# Patient Record
Sex: Female | Born: 1960 | Race: Asian | Hispanic: No | Marital: Single | Smoking: Never smoker
Health system: Southern US, Community
[De-identification: ages and names within clinical notes are randomized; demographics above are authoritative.]

## PROBLEM LIST (undated history)

## (undated) DIAGNOSIS — E785 Hyperlipidemia, unspecified: Secondary | ICD-10-CM

## (undated) DIAGNOSIS — E039 Hypothyroidism, unspecified: Secondary | ICD-10-CM

## (undated) DIAGNOSIS — R519 Headache, unspecified: Secondary | ICD-10-CM

## (undated) DIAGNOSIS — M549 Dorsalgia, unspecified: Secondary | ICD-10-CM

## (undated) DIAGNOSIS — M1990 Unspecified osteoarthritis, unspecified site: Secondary | ICD-10-CM

## (undated) DIAGNOSIS — M79652 Pain in left thigh: Secondary | ICD-10-CM

## (undated) DIAGNOSIS — G4700 Insomnia, unspecified: Secondary | ICD-10-CM

## (undated) DIAGNOSIS — I10 Essential (primary) hypertension: Secondary | ICD-10-CM

## (undated) DIAGNOSIS — M25552 Pain in left hip: Secondary | ICD-10-CM

## (undated) DIAGNOSIS — R51 Headache: Secondary | ICD-10-CM

## (undated) HISTORY — DX: Headache, unspecified: R51.9

## (undated) HISTORY — DX: Unspecified osteoarthritis, unspecified site: M19.90

## (undated) HISTORY — PX: UPPER GASTROINTESTINAL ENDOSCOPY: SHX188

## (undated) HISTORY — DX: Dorsalgia, unspecified: M54.9

## (undated) HISTORY — DX: Insomnia, unspecified: G47.00

## (undated) HISTORY — DX: Pain in left hip: M25.552

## (undated) HISTORY — DX: Headache: R51

## (undated) HISTORY — PX: TUBAL LIGATION: SHX77

## (undated) HISTORY — DX: Pain in left thigh: M79.652

## (undated) HISTORY — DX: Hyperlipidemia, unspecified: E78.5

## (undated) HISTORY — DX: Hypothyroidism, unspecified: E03.9

---

## 1998-07-11 ENCOUNTER — Emergency Department (HOSPITAL_COMMUNITY): Admission: EM | Admit: 1998-07-11 | Discharge: 1998-07-11 | Payer: Self-pay | Admitting: Emergency Medicine

## 1999-08-04 ENCOUNTER — Other Ambulatory Visit: Admission: RE | Admit: 1999-08-04 | Discharge: 1999-08-04 | Payer: Self-pay | Admitting: Obstetrics and Gynecology

## 2000-10-18 ENCOUNTER — Encounter: Admission: RE | Admit: 2000-10-18 | Discharge: 2000-10-18 | Payer: Self-pay | Admitting: Otolaryngology

## 2000-10-18 ENCOUNTER — Encounter: Payer: Self-pay | Admitting: Otolaryngology

## 2001-01-12 ENCOUNTER — Other Ambulatory Visit: Admission: RE | Admit: 2001-01-12 | Discharge: 2001-01-12 | Payer: Self-pay | Admitting: Obstetrics and Gynecology

## 2001-02-14 ENCOUNTER — Other Ambulatory Visit: Admission: RE | Admit: 2001-02-14 | Discharge: 2001-02-14 | Payer: Self-pay | Admitting: Obstetrics and Gynecology

## 2001-02-14 ENCOUNTER — Encounter (INDEPENDENT_AMBULATORY_CARE_PROVIDER_SITE_OTHER): Payer: Self-pay | Admitting: Specialist

## 2001-04-11 ENCOUNTER — Encounter: Admission: RE | Admit: 2001-04-11 | Discharge: 2001-04-17 | Payer: Self-pay | Admitting: Family Medicine

## 2001-05-31 ENCOUNTER — Other Ambulatory Visit: Admission: RE | Admit: 2001-05-31 | Discharge: 2001-05-31 | Payer: Self-pay | Admitting: Obstetrics and Gynecology

## 2001-08-22 ENCOUNTER — Other Ambulatory Visit: Admission: RE | Admit: 2001-08-22 | Discharge: 2001-08-22 | Payer: Self-pay | Admitting: Obstetrics and Gynecology

## 2001-11-22 ENCOUNTER — Other Ambulatory Visit: Admission: RE | Admit: 2001-11-22 | Discharge: 2001-11-22 | Payer: Self-pay | Admitting: Obstetrics and Gynecology

## 2002-05-03 ENCOUNTER — Encounter: Admission: RE | Admit: 2002-05-03 | Discharge: 2002-05-03 | Payer: Self-pay | Admitting: Family Medicine

## 2002-05-03 ENCOUNTER — Encounter: Payer: Self-pay | Admitting: Family Medicine

## 2002-05-30 ENCOUNTER — Other Ambulatory Visit: Admission: RE | Admit: 2002-05-30 | Discharge: 2002-05-30 | Payer: Self-pay | Admitting: Obstetrics and Gynecology

## 2002-12-25 ENCOUNTER — Other Ambulatory Visit: Admission: RE | Admit: 2002-12-25 | Discharge: 2002-12-25 | Payer: Self-pay | Admitting: Obstetrics and Gynecology

## 2003-06-27 ENCOUNTER — Other Ambulatory Visit: Admission: RE | Admit: 2003-06-27 | Discharge: 2003-06-27 | Payer: Self-pay | Admitting: Obstetrics and Gynecology

## 2003-07-26 ENCOUNTER — Encounter: Payer: Self-pay | Admitting: Internal Medicine

## 2003-07-26 ENCOUNTER — Encounter: Admission: RE | Admit: 2003-07-26 | Discharge: 2003-07-26 | Payer: Self-pay | Admitting: Internal Medicine

## 2003-08-13 ENCOUNTER — Encounter: Admission: RE | Admit: 2003-08-13 | Discharge: 2003-08-13 | Payer: Self-pay | Admitting: Internal Medicine

## 2003-08-13 ENCOUNTER — Encounter: Payer: Self-pay | Admitting: Internal Medicine

## 2003-12-23 ENCOUNTER — Encounter: Admission: RE | Admit: 2003-12-23 | Discharge: 2003-12-23 | Payer: Self-pay | Admitting: Internal Medicine

## 2004-07-30 ENCOUNTER — Other Ambulatory Visit: Admission: RE | Admit: 2004-07-30 | Discharge: 2004-07-30 | Payer: Self-pay | Admitting: Obstetrics and Gynecology

## 2005-10-20 ENCOUNTER — Other Ambulatory Visit: Admission: RE | Admit: 2005-10-20 | Discharge: 2005-10-20 | Payer: Self-pay | Admitting: Obstetrics and Gynecology

## 2009-06-13 ENCOUNTER — Encounter: Admission: RE | Admit: 2009-06-13 | Discharge: 2009-06-13 | Payer: Self-pay | Admitting: Specialist

## 2010-03-31 ENCOUNTER — Inpatient Hospital Stay (HOSPITAL_COMMUNITY): Admission: RE | Admit: 2010-03-31 | Discharge: 2010-04-02 | Payer: Self-pay | Admitting: Orthopedic Surgery

## 2010-03-31 HISTORY — PX: TOTAL HIP ARTHROPLASTY: SHX124

## 2010-08-11 ENCOUNTER — Encounter (INDEPENDENT_AMBULATORY_CARE_PROVIDER_SITE_OTHER): Payer: Self-pay | Admitting: Family Medicine

## 2010-08-11 ENCOUNTER — Ambulatory Visit: Payer: Self-pay | Admitting: Internal Medicine

## 2010-08-11 LAB — CONVERTED CEMR LAB
ALT: 51 units/L — ABNORMAL HIGH (ref 0–35)
Alkaline Phosphatase: 85 units/L (ref 39–117)
BUN: 16 mg/dL (ref 6–23)
Basophils Absolute: 0.1 10*3/uL (ref 0.0–0.1)
Calcium: 9.8 mg/dL (ref 8.4–10.5)
Creatinine, Ser: 0.56 mg/dL (ref 0.40–1.20)
Eosinophils Absolute: 1 10*3/uL — ABNORMAL HIGH (ref 0.0–0.7)
Eosinophils Relative: 16 % — ABNORMAL HIGH (ref 0–5)
HCT: 41.4 % (ref 36.0–46.0)
Hemoglobin: 13.1 g/dL (ref 12.0–15.0)
Lymphocytes Relative: 28 % (ref 12–46)
Lymphs Abs: 1.9 10*3/uL (ref 0.7–4.0)
MCV: 85.2 fL (ref 78.0–100.0)
Monocytes Absolute: 0.5 10*3/uL (ref 0.1–1.0)
Neutrophils Relative %: 48 % (ref 43–77)
Potassium: 4.4 meq/L (ref 3.5–5.3)
RDW: 15.4 % (ref 11.5–15.5)
Sed Rate: 25 mm/hr — ABNORMAL HIGH (ref 0–22)
Total Bilirubin: 0.4 mg/dL (ref 0.3–1.2)
Triglycerides: 96 mg/dL (ref <150)
UIBC: 210 ug/dL
WBC: 6.7 10*3/uL (ref 4.0–10.5)

## 2010-08-12 ENCOUNTER — Encounter (INDEPENDENT_AMBULATORY_CARE_PROVIDER_SITE_OTHER): Payer: Self-pay | Admitting: Family Medicine

## 2010-08-12 LAB — CONVERTED CEMR LAB: Hep B S Ab: NEGATIVE

## 2010-08-20 ENCOUNTER — Ambulatory Visit (HOSPITAL_COMMUNITY): Admission: RE | Admit: 2010-08-20 | Discharge: 2010-08-20 | Payer: Self-pay | Admitting: Family Medicine

## 2010-12-11 ENCOUNTER — Encounter (INDEPENDENT_AMBULATORY_CARE_PROVIDER_SITE_OTHER): Payer: Self-pay | Admitting: *Deleted

## 2010-12-11 LAB — CONVERTED CEMR LAB
Basophils Absolute: 0.1 10*3/uL (ref 0.0–0.1)
Basophils Relative: 1 % (ref 0–1)
CO2: 24 meq/L (ref 19–32)
Chloride: 103 meq/L (ref 96–112)
Eosinophils Relative: 20 % — ABNORMAL HIGH (ref 0–5)
Glucose, Bld: 84 mg/dL (ref 70–99)
HCT: 37.8 % (ref 36.0–46.0)
Hemoglobin: 12.1 g/dL (ref 12.0–15.0)
Lymphocytes Relative: 30 % (ref 12–46)
Lymphs Abs: 1.9 10*3/uL (ref 0.7–4.0)
MCHC: 32 g/dL (ref 30.0–36.0)
MCV: 86.5 fL (ref 78.0–100.0)
Monocytes Relative: 8 % (ref 3–12)
Platelets: 287 10*3/uL (ref 150–400)
Potassium: 4.3 meq/L (ref 3.5–5.3)
RDW: 13.5 % (ref 11.5–15.5)
Total Protein: 8.2 g/dL (ref 6.0–8.3)
WBC: 6.5 10*3/uL (ref 4.0–10.5)

## 2011-03-17 LAB — CBC
HCT: 26.8 % — ABNORMAL LOW (ref 36.0–46.0)
Hemoglobin: 8.9 g/dL — ABNORMAL LOW (ref 12.0–15.0)
MCHC: 32.7 g/dL (ref 30.0–36.0)
MCV: 86.7 fL (ref 78.0–100.0)
Platelets: 196 10*3/uL (ref 150–400)
Platelets: 217 10*3/uL (ref 150–400)
RBC: 3.12 MIL/uL — ABNORMAL LOW (ref 3.87–5.11)
WBC: 10.7 10*3/uL — ABNORMAL HIGH (ref 4.0–10.5)
WBC: 9.3 10*3/uL (ref 4.0–10.5)

## 2011-03-17 LAB — TYPE AND SCREEN

## 2011-03-17 LAB — BASIC METABOLIC PANEL
CO2: 26 mEq/L (ref 19–32)
CO2: 27 mEq/L (ref 19–32)
Calcium: 8.1 mg/dL — ABNORMAL LOW (ref 8.4–10.5)
Chloride: 102 mEq/L (ref 96–112)
Chloride: 107 mEq/L (ref 96–112)
Creatinine, Ser: 0.49 mg/dL (ref 0.4–1.2)
Creatinine, Ser: 0.57 mg/dL (ref 0.4–1.2)
GFR calc Af Amer: >60 mL/min (ref >60)
GFR calc non Af Amer: >60 mL/min (ref >60)
Potassium: 3.9 mEq/L (ref 3.5–5.1)

## 2011-03-17 LAB — PROTIME-INR: INR: 1.33 (ref 0.00–1.49)

## 2011-03-22 LAB — DIFFERENTIAL
Basophils Relative: 2 % — ABNORMAL HIGH (ref 0–1)
Eosinophils Absolute: 0.9 10*3/uL — ABNORMAL HIGH (ref 0.0–0.7)
Monocytes Absolute: 0.4 10*3/uL (ref 0.1–1.0)
Monocytes Relative: 7 % (ref 3–12)
Neutro Abs: 2.1 10*3/uL (ref 1.7–7.7)

## 2011-03-22 LAB — BASIC METABOLIC PANEL
BUN: 14 mg/dL (ref 6–23)
Chloride: 107 mEq/L (ref 96–112)
Creatinine, Ser: 0.55 mg/dL (ref 0.4–1.2)
GFR calc Af Amer: >60 mL/min (ref >60)
GFR calc non Af Amer: >60 mL/min (ref >60)
Potassium: 3.9 mEq/L (ref 3.5–5.1)
Sodium: 139 mEq/L (ref 135–145)

## 2011-03-22 LAB — CBC
HCT: 36.5 % (ref 36.0–46.0)
MCV: 86 fL (ref 78.0–100.0)
RDW: 12.6 % (ref 11.5–15.5)

## 2011-03-22 LAB — APTT: aPTT: 30 seconds (ref 24–37)

## 2011-03-22 LAB — URINALYSIS, ROUTINE W REFLEX MICROSCOPIC
Bilirubin Urine: NEGATIVE
Glucose, UA: NEGATIVE mg/dL
Ketones, ur: NEGATIVE mg/dL
Nitrite: NEGATIVE
pH: 6.5 (ref 5.0–8.0)

## 2011-03-22 LAB — PROTIME-INR
INR: 1.06 (ref 0.00–1.49)
Prothrombin Time: 13.7 seconds (ref 11.6–15.2)

## 2011-06-02 IMAGING — CR DG HIP 1V PORT*R*
1 series · 1 of 1 positions shown · non-contrast
Comparison: 06/13/2009

CLINICAL DATA: Postop right hip replacement.

PORTABLE RIGHT HIP - 1 VIEW

[view not recorded]
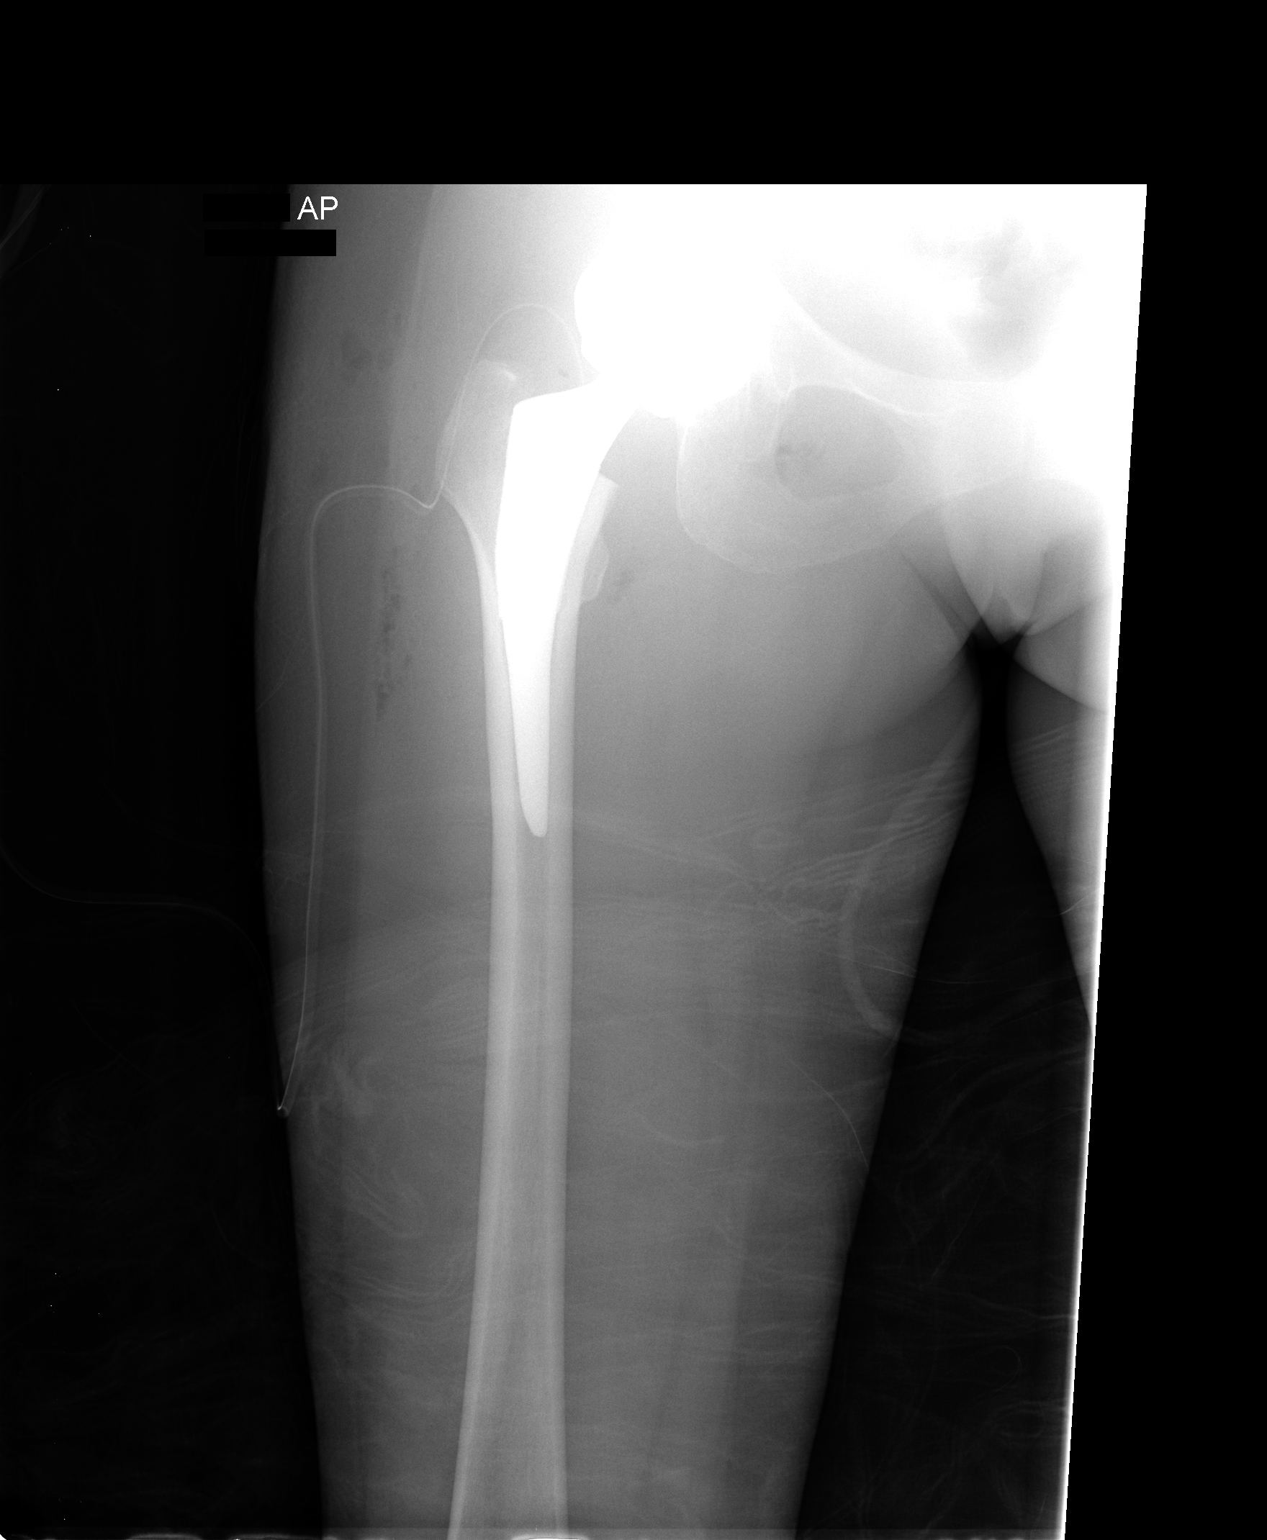

[1 of 1 positions shown; findings below may reference images not displayed]

FINDINGS: Changes of right hip replacement noted.  No hardware or
bony complicating feature.  Soft tissue gas seen.
IMPRESSION: Right hip replacement without complicating feature.

## 2011-10-26 ENCOUNTER — Emergency Department (HOSPITAL_COMMUNITY)
Admission: EM | Admit: 2011-10-26 | Discharge: 2011-10-27 | Disposition: A | Discharge status: Home / Self Care | Payer: No Typology Code available for payment source | Attending: Emergency Medicine | Admitting: Emergency Medicine

## 2011-10-26 DIAGNOSIS — R1110 Vomiting, unspecified: Secondary | ICD-10-CM | POA: Insufficient documentation

## 2011-10-26 DIAGNOSIS — H8110 Benign paroxysmal vertigo, unspecified ear: Secondary | ICD-10-CM | POA: Insufficient documentation

## 2011-10-26 DIAGNOSIS — H53149 Visual discomfort, unspecified: Secondary | ICD-10-CM | POA: Insufficient documentation

## 2011-10-27 LAB — URINALYSIS, ROUTINE W REFLEX MICROSCOPIC
Bilirubin Urine: NEGATIVE
Hgb urine dipstick: NEGATIVE
Ketones, ur: 15 mg/dL — AB
Protein, ur: NEGATIVE mg/dL
Urobilinogen, UA: 0.2 mg/dL (ref 0.0–1.0)

## 2011-10-27 LAB — DIFFERENTIAL
Basophils Absolute: 0.1 10*3/uL (ref 0.0–0.1)
Basophils Relative: 1 % (ref 0–1)
Eosinophils Absolute: 0.3 10*3/uL (ref 0.0–0.7)
Eosinophils Relative: 3 % (ref 0–5)
Lymphocytes Relative: 10 % — ABNORMAL LOW (ref 12–46)
Lymphs Abs: 0.9 10*3/uL (ref 0.7–4.0)
Monocytes Relative: 3 % (ref 3–12)

## 2011-10-27 LAB — CBC
HCT: 35 % — ABNORMAL LOW (ref 36.0–46.0)
Hemoglobin: 11 g/dL — ABNORMAL LOW (ref 12.0–15.0)
MCH: 26.8 pg (ref 26.0–34.0)
MCHC: 31.4 g/dL (ref 30.0–36.0)
MCV: 85.2 fL (ref 78.0–100.0)

## 2011-10-27 LAB — COMPREHENSIVE METABOLIC PANEL
AST: 41 U/L — ABNORMAL HIGH (ref 0–37)
Albumin: 4 g/dL (ref 3.5–5.2)
Alkaline Phosphatase: 79 U/L (ref 39–117)
BUN: 16 mg/dL (ref 6–23)
CO2: 25 mEq/L (ref 19–32)
Chloride: 100 mEq/L (ref 96–112)
Creatinine, Ser: 0.49 mg/dL — ABNORMAL LOW (ref 0.50–1.10)

## 2011-10-27 LAB — URINE MICROSCOPIC-ADD ON

## 2012-11-08 LAB — HM PAP SMEAR

## 2012-11-08 LAB — HM MAMMOGRAPHY

## 2012-12-07 ENCOUNTER — Ambulatory Visit: Payer: No Typology Code available for payment source | Admitting: Internal Medicine

## 2013-04-13 ENCOUNTER — Other Ambulatory Visit (INDEPENDENT_AMBULATORY_CARE_PROVIDER_SITE_OTHER): Payer: BC Managed Care – PPO

## 2013-04-13 ENCOUNTER — Encounter: Payer: Self-pay | Admitting: Internal Medicine

## 2013-04-13 ENCOUNTER — Ambulatory Visit (INDEPENDENT_AMBULATORY_CARE_PROVIDER_SITE_OTHER): Payer: BC Managed Care – PPO | Admitting: Internal Medicine

## 2013-04-13 VITALS — BP 118/78 | HR 62 | Temp 97.0°F | Resp 14 | Ht 58.75 in | Wt 113.2 lb

## 2013-04-13 DIAGNOSIS — Z1211 Encounter for screening for malignant neoplasm of colon: Secondary | ICD-10-CM

## 2013-04-13 DIAGNOSIS — E785 Hyperlipidemia, unspecified: Secondary | ICD-10-CM

## 2013-04-13 DIAGNOSIS — Z0000 Encounter for general adult medical examination without abnormal findings: Secondary | ICD-10-CM

## 2013-04-13 DIAGNOSIS — M160 Bilateral primary osteoarthritis of hip: Secondary | ICD-10-CM | POA: Insufficient documentation

## 2013-04-13 DIAGNOSIS — G4700 Insomnia, unspecified: Secondary | ICD-10-CM

## 2013-04-13 DIAGNOSIS — M169 Osteoarthritis of hip, unspecified: Secondary | ICD-10-CM

## 2013-04-13 DIAGNOSIS — F5104 Psychophysiologic insomnia: Secondary | ICD-10-CM | POA: Insufficient documentation

## 2013-04-13 DIAGNOSIS — Z124 Encounter for screening for malignant neoplasm of cervix: Secondary | ICD-10-CM

## 2013-04-13 LAB — LIPID PANEL: Cholesterol: 229 mg/dL — ABNORMAL HIGH (ref 0–200)

## 2013-04-13 LAB — LDL CHOLESTEROL, DIRECT: Direct LDL: 151.1 mg/dL

## 2013-04-13 LAB — CBC WITH DIFFERENTIAL/PLATELET
Basophils Relative: 1.4 % (ref 0.0–3.0)
Eosinophils Absolute: 1.4 10*3/uL — ABNORMAL HIGH (ref 0.0–0.7)
Eosinophils Relative: 19.3 % — ABNORMAL HIGH (ref 0.0–5.0)
Lymphocytes Relative: 26 % (ref 12.0–46.0)
MCHC: 33.2 g/dL (ref 30.0–36.0)
MCV: 82.7 fl (ref 78.0–100.0)
Monocytes Absolute: 0.5 10*3/uL (ref 0.1–1.0)
Neutrophils Relative %: 46.7 % (ref 43.0–77.0)
Platelets: 304 10*3/uL (ref 150.0–400.0)
RBC: 4.61 Mil/uL (ref 3.87–5.11)
WBC: 7 10*3/uL (ref 4.5–10.5)

## 2013-04-13 LAB — BASIC METABOLIC PANEL
BUN: 19 mg/dL (ref 6–23)
Calcium: 9.6 mg/dL (ref 8.4–10.5)
Creatinine, Ser: 0.6 mg/dL (ref 0.4–1.2)

## 2013-04-13 LAB — URINALYSIS, ROUTINE W REFLEX MICROSCOPIC
Hgb urine dipstick: NEGATIVE
Nitrite: NEGATIVE
Specific Gravity, Urine: 1.01 (ref 1.000–1.030)
Urine Glucose: NEGATIVE
Urobilinogen, UA: 0.2 (ref 0.0–1.0)

## 2013-04-13 LAB — HEPATIC FUNCTION PANEL
AST: 30 U/L (ref 0–37)
Albumin: 4.3 g/dL (ref 3.5–5.2)
Alkaline Phosphatase: 77 U/L (ref 39–117)
Bilirubin, Direct: 0 mg/dL (ref 0.0–0.3)
Total Protein: 8.2 g/dL (ref 6.0–8.3)

## 2013-04-13 LAB — TSH: TSH: 2.2 u[IU]/mL (ref 0.35–5.50)

## 2013-04-13 MED ORDER — DICLOFENAC POTASSIUM 50 MG PO TABS: 50.0000 mg | ORAL_TABLET | Freq: Two times a day (BID) | ORAL | Status: DC

## 2013-04-13 MED ORDER — ZOLPIDEM TARTRATE 10 MG PO TABS: 5.0000 mg | ORAL_TABLET | Freq: Every evening | ORAL | Status: DC | PRN

## 2013-04-13 NOTE — Patient Instructions (Signed)
It was good to see you today. We have reviewed your prior records including labs and tests today Health Maintenance reviewed - all recommended immunizations and age-appropriate screenings are up-to-date. we'll make referral to gynecology to followup on your Pap smear and mammogram; also to gastroenterology for screening colonoscopy after age 50. Our office will contact you regarding appointment(s) once made. Test(s) ordered today. Your results will be released to MyChart (or called to you) after review, usually within 72hours after test completion. If any changes need to be made, you will be notified at that same time. Medications reviewed -refill on diclofenac for hip pain and begin Ambien as needed for sleep - Your prescription(s) have been submitted to your pharmacy. Please take as directed and contact our office if you believe you are having problem(s) with the medication(s). 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  Health Maintenance, Females A healthy lifestyle and preventative care can promote health and wellness.  Maintain regular health, dental, and eye exams.  Eat a healthy diet. Foods like vegetables, fruits, whole grains, low-fat dairy products, and lean protein foods contain the nutrients you need without too many calories. Decrease your intake of foods high in solid fats, added sugars, and salt. Get information about a proper diet from your caregiver, if necessary.  Regular physical exercise is one of the most important things you can do for your health. Most adults should get at least 150 minutes of moderate-intensity exercise (any activity that increases your heart rate and causes you to sweat) each week. In addition, most adults need muscle-strengthening exercises on 2 or more days a week.   Maintain a  healthy weight. The body mass index (BMI) is a screening tool to identify possible weight problems. It provides an estimate of body fat based on height and weight. Your caregiver can help determine your BMI, and can help you achieve or maintain a healthy weight. For adults 20 years and older:  A BMI below 18.5 is considered underweight.  A BMI of 18.5 to 24.9 is normal.  A BMI of 25 to 29.9 is considered overweight.  A BMI of 30 and above is considered obese.  Maintain normal blood lipids and cholesterol by exercising and minimizing your intake of saturated fat. Eat a balanced diet with plenty of fruits and vegetables. Blood tests for lipids and cholesterol should begin at age 5 and be repeated every 5 years. If your lipid or cholesterol levels are high, you are over 50, or you are a high risk for heart disease, you may need your cholesterol levels checked more frequently.Ongoing high lipid and cholesterol levels should be treated with medicines if diet and exercise are not effective.  If you smoke, find out from your caregiver how to quit. If you do not use tobacco, do not start.  If you are pregnant, do not drink alcohol. If you are breastfeeding, be very cautious about drinking alcohol. If you are not pregnant and choose to drink alcohol, do not exceed 1 drink per day. One drink is considered to be 12 ounces (355 mL) of beer, 5 ounces (148 mL) of wine, or 1.5 ounces (44 mL) of liquor.  Avoid use of street drugs. Do not share needles with anyone. Ask for help if you need support or instructions about stopping the use of drugs.  High  blood pressure causes heart disease and increases the risk of stroke. Blood pressure should be checked at least every 1 to 2 years. Ongoing high blood pressure should be treated with medicines, if weight loss and exercise are not effective.  If you are 80 to 51 years old, ask your caregiver if you should take aspirin to prevent strokes.  Diabetes screening  involves taking a blood sample to check your fasting blood sugar level. This should be done once every 3 years, after age 43, if you are within normal weight and without risk factors for diabetes. Testing should be considered at a younger age or be carried out more frequently if you are overweight and have at least 1 risk factor for diabetes.  Breast cancer screening is essential preventative care for women. You should practice "breast self-awareness." This means understanding the normal appearance and feel of your breasts and may include breast self-examination. Any changes detected, no matter how small, should be reported to a caregiver. Women in their 62s and 30s should have a clinical breast exam (CBE) by a caregiver as part of a regular health exam every 1 to 3 years. After age 58, women should have a CBE every year. Starting at age 27, women should consider having a mammogram (breast X-ray) every year. Women who have a family history of breast cancer should talk to their caregiver about genetic screening. Women at a high risk of breast cancer should talk to their caregiver about having an MRI and a mammogram every year.  The Pap test is a screening test for cervical cancer. Women should have a Pap test starting at age 43. Between ages 84 and 67, Pap tests should be repeated every 2 years. Beginning at age 70, you should have a Pap test every 3 years as long as the past 3 Pap tests have been normal. If you had a hysterectomy for a problem that was not cancer or a condition that could lead to cancer, then you no longer need Pap tests. If you are between ages 78 and 30, and you have had normal Pap tests going back 10 years, you no longer need Pap tests. If you have had past treatment for cervical cancer or a condition that could lead to cancer, you need Pap tests and screening for cancer for at least 20 years after your treatment. If Pap tests have been discontinued, risk factors (such as a new sexual  partner) need to be reassessed to determine if screening should be resumed. Some women have medical problems that increase the chance of getting cervical cancer. In these cases, your caregiver may recommend more frequent screening and Pap tests.  The human papillomavirus (HPV) test is an additional test that may be used for cervical cancer screening. The HPV test looks for the virus that can cause the cell changes on the cervix. The cells collected during the Pap test can be tested for HPV. The HPV test could be used to screen women aged 52 years and older, and should be used in women of any age who have unclear Pap test results. After the age of 40, women should have HPV testing at the same frequency as a Pap test.  Colorectal cancer can be detected and often prevented. Most routine colorectal cancer screening begins at the age of 41 and continues through age 9. However, your caregiver may recommend screening at an earlier age if you have risk factors for colon cancer. On a yearly basis, your caregiver may provide  home test kits to check for hidden blood in the stool. Use of a small camera at the end of a tube, to directly examine the colon (sigmoidoscopy or colonoscopy), can detect the earliest forms of colorectal cancer. Talk to your caregiver about this at age 31, when routine screening begins. Direct examination of the colon should be repeated every 5 to 10 years through age 17, unless early forms of pre-cancerous polyps or small growths are found.  Hepatitis C blood testing is recommended for all people born from [birth year] through 1963 and any individual with known risks for hepatitis C.  Practice safe sex. Use condoms and avoid high-risk sexual practices to reduce the spread of sexually transmitted infections (STIs). Sexually active women aged 70 and younger should be checked for Chlamydia, which is a common sexually transmitted infection. Older women with new or multiple partners should also be tested  for Chlamydia. Testing for other STIs is recommended if you are sexually active and at increased risk.  Osteoporosis is a disease in which the bones lose minerals and strength with aging. This can result in serious bone fractures. The risk of osteoporosis can be identified using a bone density scan. Women ages 51 and over and women at risk for fractures or osteoporosis should discuss screening with their caregivers. Ask your caregiver whether you should be taking a calcium supplement or vitamin D to reduce the rate of osteoporosis.  Menopause can be associated with physical symptoms and risks. Hormone replacement therapy is available to decrease symptoms and risks. You should talk to your caregiver about whether hormone replacement therapy is right for you.  Use sunscreen with a sun protection factor (SPF) of 30 or greater. Apply sunscreen liberally and repeatedly throughout the day. You should seek shade when your shadow is shorter than you. Protect yourself by wearing long sleeves, pants, a wide-brimmed hat, and sunglasses year round, whenever you are outdoors.  Notify your caregiver of new moles or changes in moles, especially if there is a change in shape or color. Also notify your caregiver if a mole is larger than the size of a pencil eraser.  Stay current with your immunizations. Document Released: 06/28/2011 Document Revised: 03/06/2012 Document Reviewed: 06/28/2011 [facility] Patient Information 2013 [place], [place].

## 2013-04-13 NOTE — Progress Notes (Signed)
Subjective:    Patient ID: Maria Barker, female    DOB: 08/15/1961, 52 y.o.   MRN: [id number]  HPI New patient to me and our practice, here today to establish care patient is here today for annual physical. 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 Arthritis     R THR 2011   Family History  Problem Relation Age of Onset  . CVA Mother   . Hypertension Mother    History  Substance Use Topics  . Smoking status: Never Smoker   . Smokeless tobacco: Not on file  . Alcohol Use: Not on file     Review of Systems Constitutional: Negative for fever or weight change.  Respiratory: Negative for cough and shortness of breath.   Cardiovascular: Negative for chest pain or palpitations.  Gastrointestinal: Negative for abdominal pain, no bowel changes.  Musculoskeletal: Negative for gait problem or joint swelling.  Skin: Negative for rash.  Neurological: Negative for dizziness or headache.  No other specific complaints in a complete review of systems (except as listed in HPI above).     Objective:   Physical Exam BP 118/78  Pulse 62  Temp(Src) 97 F (36.1 C) (Oral)  Resp 14  Ht 4' 10.75" (1.492 m)  Wt 113 lb 4 oz (51.37 kg)  BMI 23.08 kg/m2  SpO2 98% Wt Readings from Last 3  Encounters:  04/13/13 113 lb 4 oz (51.37 kg)   Constitutional: She appears well-developed and well-nourished. No distress.  HENT: Head: Normocephalic and atraumatic. Ears: B TMs ok, no erythema or effusion; Nose: Nose normal. Mouth/Throat: Oropharynx is clear and moist. No oropharyngeal exudate.  Eyes: Conjunctivae and EOM are normal. Pupils are equal, round, and reactive to light. No scleral icterus.  Neck: Normal range of motion. Neck supple. No JVD present. No thyromegaly present.  Cardiovascular: Normal rate, regular rhythm and normal heart sounds.  No murmur heard. No BLE edema. Pulmonary/Chest: Effort normal and breath sounds normal. No respiratory distress. She has no wheezes.  Abdominal: Soft. Bowel sounds are normal. She exhibits no distension. There is no tenderness. no masses Musculoskeletal: L hip with pain over groin - painful ROM in all directions. no joint effusions. No gross deformities Neurological: She is alert and oriented to person, place, and time. No cranial nerve deficit. Coordination normal.  Skin: Skin is warm and dry. No rash noted. No erythema.  Psychiatric: She has a normal mood and affect. Her behavior is normal. Judgment and thought content normal.   Lab Results  Component Value Date   WBC 9.5 10/26/2011   HGB 11.0* 10/26/2011   HCT 35.0* 10/26/2011   PLT 331 10/26/2011   GLUCOSE 126* 10/26/2011   CHOL 238* 08/11/2010   TRIG 96 08/11/2010   HDL 65 08/11/2010   LDLCALC 154* 08/11/2010   ALT 44*  10/26/2011   AST 41* 10/26/2011   NA 135 10/26/2011   K 3.8 10/26/2011   CL 100 10/26/2011   CREATININE 0.49* 10/26/2011   BUN 16 10/26/2011   CO2 25 10/26/2011   TSH 2.584 08/11/2010   INR 1.59* 04/02/2010       Assessment & Plan:   CPX/v70.0 - Patient has been counseled on age-appropriate routine health concerns for screening and prevention. These are reviewed and up-to-date. Immunizations are up-to-date or declined. Labs ordered and reviewed.  Also See  problem list. Medications and labs reviewed today.

## 2013-04-13 NOTE — Assessment & Plan Note (Signed)
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

## 2013-04-13 NOTE — Assessment & Plan Note (Signed)
Has previously used Ambien for same in past with good results Review same today, consider alternate medicine as needed based on insurance formulary and approval

## 2013-04-14 ENCOUNTER — Encounter: Payer: Self-pay | Admitting: Internal Medicine

## 2013-04-14 DIAGNOSIS — E785 Hyperlipidemia, unspecified: Secondary | ICD-10-CM | POA: Insufficient documentation

## 2013-04-14 DIAGNOSIS — E7800 Pure hypercholesterolemia, unspecified: Secondary | ICD-10-CM | POA: Insufficient documentation

## 2013-04-14 MED ORDER — ATORVASTATIN CALCIUM 10 MG PO TABS: 10.0000 mg | ORAL_TABLET | Freq: Every day | ORAL | Status: DC

## 2013-04-14 NOTE — Addendum Note (Signed)
Addended by: Rene Paci A on: 04/14/2013 11:03 AM   Modules accepted: Orders

## 2013-04-18 ENCOUNTER — Encounter: Payer: Self-pay | Admitting: *Deleted

## 2013-04-20 ENCOUNTER — Telehealth: Payer: Self-pay | Admitting: *Deleted

## 2013-04-20 NOTE — Telephone Encounter (Signed)
Pt return call back inform her we had been trying to call her concerning her labs but the # we had on file was incorrect #. New new was updated. Gave pt md response concerning her cholesterol, also let her know we mail her a results letter and she should be receiving letter soon...[name]

## 2013-04-20 NOTE — Telephone Encounter (Signed)
Left msg on vm requesting lab work. Tried calling pt back no answer LMOM RTC...[name]

## 2013-04-30 ENCOUNTER — Ambulatory Visit: Payer: BC Managed Care – PPO | Admitting: Internal Medicine

## 2013-06-26 ENCOUNTER — Ambulatory Visit: Payer: No Typology Code available for payment source | Admitting: Internal Medicine

## 2013-07-09 ENCOUNTER — Encounter: Payer: Self-pay | Admitting: Internal Medicine

## 2013-08-21 ENCOUNTER — Ambulatory Visit (AMBULATORY_SURGERY_CENTER): Payer: BC Managed Care – PPO

## 2013-08-21 ENCOUNTER — Encounter: Payer: Self-pay | Admitting: Internal Medicine

## 2013-08-21 VITALS — Ht 58.5 in | Wt 114.8 lb

## 2013-08-21 DIAGNOSIS — Z1211 Encounter for screening for malignant neoplasm of colon: Secondary | ICD-10-CM

## 2013-08-21 MED ORDER — MOVIPREP 100 G PO SOLR: ORAL | Status: DC

## 2013-09-04 ENCOUNTER — Encounter: Payer: Self-pay | Admitting: Internal Medicine

## 2013-09-04 ENCOUNTER — Ambulatory Visit (AMBULATORY_SURGERY_CENTER): Payer: BC Managed Care – PPO | Admitting: Internal Medicine

## 2013-09-04 VITALS — BP 123/80 | HR 61 | Temp 97.9°F | Resp 18 | Ht 58.5 in | Wt 114.0 lb

## 2013-09-04 DIAGNOSIS — D126 Benign neoplasm of colon, unspecified: Secondary | ICD-10-CM

## 2013-09-04 DIAGNOSIS — Z1211 Encounter for screening for malignant neoplasm of colon: Secondary | ICD-10-CM

## 2013-09-04 MED ORDER — SODIUM CHLORIDE 0.9 % IV SOLN: 500.0000 mL | INTRAVENOUS | Status: DC

## 2013-09-04 NOTE — Op Note (Signed)
[facility] [address] [place], [address]   COLONOSCOPY PROCEDURE REPORT  PATIENT: Maria, Barker  MR#: [id number] BIRTHDATE: [birth year] , 52  yrs. old GENDER: Female ENDOSCOPIST: Hart Carwin, MD REFERRED IO:[id number] Felicity Coyer, M.D. PROCEDURE DATE:  09/04/2013 PROCEDURE:   Colonoscopy with cold biopsy polypectomy First Screening Colonoscopy - Avg.  risk and is 50 yrs.  old or older Yes.  Prior Negative Screening - Now for repeat screening. N/A  History of Adenoma - Now for follow-up colonoscopy & has been > or = to 3 yrs.  N/A  Polyps Removed Today? Yes. ASA CLASS:   Class I INDICATIONS:Average risk patient for colon cancer. MEDICATIONS: MAC sedation, administered by CRNA and propofol (Diprivan) 250mg  IV  DESCRIPTION OF PROCEDURE:   After the risks benefits and alternatives of the procedure were thoroughly explained, informed consent was obtained.  A digital rectal exam revealed no abnormalities of the rectum.   The LB [id number]  endoscope was introduced through the anus and advanced to the cecum, which was identified by both the appendix and ileocecal valve. No adverse events experienced.   The quality of the prep was good, using MoviPrep  The instrument was then slowly withdrawn as the colon was fully examined.      COLON FINDINGS: A diminutive smooth sessile polyp was found in the sigmoid colon.  A polypectomy was performed with cold forceps. The time to cecum=4 minutes 20 seconds.  Withdrawal time=7 minutes 0 seconds.  The scope was withdrawn and the procedure completed. COMPLICATIONS: There were no complications.  ENDOSCOPIC IMPRESSION: Diminutive sessile polyp was found in the sigmoid colon; polypectomy was performed with cold forceps  RECOMMENDATIONS: 1.  Await pathology results 2.  High fiber diet   eSigned:  Hart Carwin, MD 09/04/2013 11:19 AM   cc:   PATIENT NAME:  Maria, Barker MR#: [id number]

## 2013-09-04 NOTE — Patient Instructions (Addendum)

## 2013-09-04 NOTE — Progress Notes (Signed)
Patient did not have preoperative order for IV antibiotic SSI prophylaxis. (G8918)  Patient did not experience any of the following events: a burn prior to discharge; a fall within the facility; wrong site/side/patient/procedure/implant event; or a hospital transfer or hospital admission upon discharge from the facility. (G8907)  

## 2013-09-04 NOTE — Progress Notes (Signed)
Lidocaine-40mg IV prior to Propofol InductionPropofol given over incremental dosages 

## 2013-09-04 NOTE — Progress Notes (Signed)
Called to room to assist during endoscopic procedure.  Patient ID and intended procedure confirmed with present staff. Received instructions for my participation in the procedure from the performing physician.  

## 2013-09-05 ENCOUNTER — Telehealth: Payer: Self-pay | Admitting: *Deleted

## 2013-09-05 NOTE — Telephone Encounter (Signed)
  Follow up Call-  Call back number 09/04/2013  Post procedure Call Back phone  # [phone]  Permission to leave phone message Yes     Patient questions:  Do you have a fever, pain , or abdominal swelling? no Pain Score  0 *  Have you tolerated food without any problems? yes  Have you been able to return to your normal activities? yes  Do you have any questions about your discharge instructions: Diet   no Medications  no Follow up visit  no  Do you have questions or concerns about your Care? no  Actions: * If pain score is 4 or above: No action needed, pain <4.

## 2013-09-10 ENCOUNTER — Encounter: Payer: Self-pay | Admitting: Internal Medicine

## 2013-12-21 ENCOUNTER — Encounter: Payer: Self-pay | Admitting: Internal Medicine

## 2013-12-21 ENCOUNTER — Ambulatory Visit (INDEPENDENT_AMBULATORY_CARE_PROVIDER_SITE_OTHER): Payer: BC Managed Care – PPO | Admitting: Internal Medicine

## 2013-12-21 ENCOUNTER — Other Ambulatory Visit (INDEPENDENT_AMBULATORY_CARE_PROVIDER_SITE_OTHER): Payer: BC Managed Care – PPO

## 2013-12-21 VITALS — BP 138/80 | HR 64 | Temp 97.1°F | Wt 114.4 lb

## 2013-12-21 DIAGNOSIS — F329 Major depressive disorder, single episode, unspecified: Secondary | ICD-10-CM

## 2013-12-21 DIAGNOSIS — F5104 Psychophysiologic insomnia: Secondary | ICD-10-CM

## 2013-12-21 DIAGNOSIS — E785 Hyperlipidemia, unspecified: Secondary | ICD-10-CM

## 2013-12-21 DIAGNOSIS — G4700 Insomnia, unspecified: Secondary | ICD-10-CM

## 2013-12-21 DIAGNOSIS — F32A Depression, unspecified: Secondary | ICD-10-CM

## 2013-12-21 DIAGNOSIS — K219 Gastro-esophageal reflux disease without esophagitis: Secondary | ICD-10-CM

## 2013-12-21 DIAGNOSIS — R5383 Other fatigue: Secondary | ICD-10-CM

## 2013-12-21 DIAGNOSIS — R5381 Other malaise: Secondary | ICD-10-CM

## 2013-12-21 LAB — BASIC METABOLIC PANEL
BUN: 14 mg/dL (ref 6–23)
Chloride: 105 mEq/L (ref 96–112)
GFR: 135.38 mL/min (ref >60.00)
Potassium: 3.8 mEq/L (ref 3.5–5.1)
Sodium: 136 mEq/L (ref 135–145)

## 2013-12-21 LAB — CBC WITH DIFFERENTIAL/PLATELET
Basophils Relative: 1.3 % (ref 0.0–3.0)
Eosinophils Relative: 15 % — ABNORMAL HIGH (ref 0.0–5.0)
HCT: 36.6 % (ref 36.0–46.0)
Hemoglobin: 12 g/dL (ref 12.0–15.0)
Lymphs Abs: 1.8 10*3/uL (ref 0.7–4.0)
MCHC: 32.8 g/dL (ref 30.0–36.0)
MCV: 82.3 fl (ref 78.0–100.0)
Monocytes Absolute: 0.7 10*3/uL (ref 0.1–1.0)
Monocytes Relative: 7.9 % (ref 3.0–12.0)
Neutro Abs: 4.9 10*3/uL (ref 1.4–7.7)
Platelets: 347 10*3/uL (ref 150.0–400.0)
RBC: 4.45 Mil/uL (ref 3.87–5.11)
WBC: 8.9 10*3/uL (ref 4.5–10.5)

## 2013-12-21 LAB — HEPATIC FUNCTION PANEL
Albumin: 4.1 g/dL (ref 3.5–5.2)
Alkaline Phosphatase: 86 U/L (ref 39–117)
Bilirubin, Direct: 0 mg/dL (ref 0.0–0.3)
Total Protein: 8 g/dL (ref 6.0–8.3)

## 2013-12-21 LAB — VITAMIN B12: Vitamin B-12: 1229 pg/mL — ABNORMAL HIGH (ref 211–911)

## 2013-12-21 LAB — LIPID PANEL
Cholesterol: 161 mg/dL (ref 0–200)
HDL: 49.1 mg/dL (ref >39.00)
Triglycerides: 214 mg/dL — ABNORMAL HIGH (ref 0.0–149.0)
VLDL: 42.8 mg/dL — ABNORMAL HIGH (ref 0.0–40.0)

## 2013-12-21 LAB — LDL CHOLESTEROL, DIRECT: Direct LDL: 93.7 mg/dL

## 2013-12-21 LAB — TSH: TSH: 4.63 u[IU]/mL (ref 0.35–5.50)

## 2013-12-21 MED ORDER — AMITRIPTYLINE HCL 10 MG PO TABS: 10.0000 mg | ORAL_TABLET | Freq: Every day | ORAL | Status: DC

## 2013-12-21 MED ORDER — RANITIDINE HCL 150 MG PO TABS: 150.0000 mg | ORAL_TABLET | Freq: Two times a day (BID) | ORAL | Status: DC

## 2013-12-21 MED ORDER — PAROXETINE HCL 10 MG PO TABS: 10.0000 mg | ORAL_TABLET | Freq: Every day | ORAL | Status: DC

## 2013-12-21 NOTE — Progress Notes (Signed)
Pre-visit discussion using our clinic review tool. No additional management support is needed unless otherwise documented below in the visit note.  

## 2013-12-21 NOTE — Assessment & Plan Note (Signed)
Begin low-dose statin April 2014 Check lipids and LFTs now, titrate as needed

## 2013-12-21 NOTE — Progress Notes (Signed)
Subjective:    Patient ID: Donald Prose, female    DOB: 1961-04-26, 52 y.o.   MRN: [id number]  Dizziness Associated symptoms include fatigue. Pertinent negatives include no chest pain, coughing, fever, headaches, numbness or weakness.    See CC -  Chief Complaint  Patient presents with  . Dizziness  . 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 Arthritis     R THR 2011  . Hyperlipidemia LDL goal < 130   . Pain of left thigh   . Back pain   . Left hip pain   . Insomnia   . Frequent headaches     Review of Systems  Constitutional: Positive for appetite change ("bitter" food and water taste) and fatigue. Negative for fever, activity change and unexpected weight change.  Respiratory: Negative for cough and shortness of breath.   Cardiovascular: Negative for chest pain and leg swelling.  Gastrointestinal:       Occ GERD, esp nocturnal/supine - variable response to Prilosec  Neurological: Positive for dizziness and light-headedness. Negative for seizures, syncope, facial asymmetry, speech difficulty, weakness, numbness and headaches.  Psychiatric/Behavioral: Positive for sleep disturbance, dysphoric mood and decreased concentration. Negative for suicidal ideas, hallucinations, confusion and self-injury. The patient is not nervous/anxious.        Objective:   Physical Exam BP 138/80  Pulse 64  Temp(Src) 97.1 F (36.2 C) (Oral)  Wt 114 lb 6.4 oz (51.891 kg)  SpO2 98% Wt Readings from Last 3 Encounters:  12/21/13 114 lb 6.4 oz (51.891 kg)  09/04/13 114 lb (51.71 kg)  08/21/13 114 lb 12.8 oz (52.073 kg)   Constitutional: She appears well-developed and well-nourished. No  distress.  HENT: Head: Normocephalic and atraumatic. Ears: B TMs ok, no erythema or effusion; Nose: Nose normal. Mouth/Throat: Oropharynx is clear and moist. No oropharyngeal exudate.  Eyes: Conjunctivae and EOM are normal. Pupils are equal, round, and reactive to light. No scleral icterus.  Neck: Normal range of motion. Neck supple. No JVD present. No thyromegaly present.  Cardiovascular: Normal rate, regular rhythm and normal heart sounds.  No murmur heard. No BLE edema. Pulmonary/Chest: Effort normal and breath sounds normal. No respiratory distress. She has no wheezes. Abdomen: SNTND+BS Neurological: She is alert and oriented to person, place, and time. No cranial nerve deficit. Coordination, balance, strength, speech and gait are normal.  Skin: Skin is warm and dry. No rash noted. No erythema.  Psychiatric: She has a normal mood and affect. Her behavior is normal. Judgment and thought content normal.   Lab Results  Component Value Date   WBC 7.0 04/13/2013   HGB 12.6 04/13/2013   HCT 38.1 04/13/2013   PLT 304.0 04/13/2013   GLUCOSE 80 04/13/2013   CHOL 229* 04/13/2013   TRIG 120.0 04/13/2013   HDL 54.20 04/13/2013   LDLDIRECT 151.1 04/13/2013   LDLCALC 154* 08/11/2010   ALT 28 04/13/2013   AST 30 04/13/2013   NA 137 04/13/2013   K 4.4 04/13/2013   CL 103 04/13/2013   CREATININE 0.6 04/13/2013   BUN 19 04/13/2013   CO2 29 04/13/2013   TSH 2.20 04/13/2013   INR 1.59* 04/02/2010  Assessment & Plan:   Fatigue - nonspecific symptoms/exam, suspect dysthymia/depression - check screening labs - see below  GERD - start H2B BID x 30d - consider GI eval if anemia or if symptoms unimproved with med tx  See problem list. Medications and labs reviewed today.

## 2013-12-21 NOTE — Assessment & Plan Note (Signed)
Chronic issue over 15 years reviewed Has previously used Ambien for same in past with good results, But less effective seasonal depression flare 2014 Review same today, Start low-dose amitriptyline and referred to sleep specialist for further evaluation and treatment of chronic issue

## 2013-12-21 NOTE — Assessment & Plan Note (Signed)
Chronic low-grade symptoms, seasonal exacerbation 2014 by holidays and family stressors Plan low-dose amitriptyline and daily low-dose paroxetine we reviewed potential risk/benefit and possible side effects - pt understands and agrees to same   Followup 6 weeks to review symptoms and titrate as needed, patient agrees call sooner if problems verified no SI/HI

## 2013-12-21 NOTE — Patient Instructions (Addendum)
It was good to see you today.  We have reviewed your prior records including labs and tests today  Test(s) ordered today. Your results will be released to MyChart (or called to you) after review, usually within 72hours after test completion. If any changes need to be made, you will be notified at that same time.  Medications reviewed and updated:  Start Zantac twice daily for stomach symptoms -take for the next 30 days, then as needed Begin low-dose amitriptyline at bedtime for sleep and low dose Paxil every morning for mood and energy Your prescription(s) have been submitted to your pharmacy. Please take as directed and contact our office if you believe you are having problem(s) with the medication(s).  Refer to sleep specialist to further evaluate sleep problems and treat as needed -my office will call regarding this appointment once made  Please schedule followup in 6 weeks to review symptoms and medications, call sooner if problems.  Depression, Adult Depression is feeling sad, low, down in the dumps, blue, gloomy, or empty. In general, there are two kinds of depression:  Normal sadness or grief. This can happen after something upsetting. It often goes away on its own within 2 weeks. After losing a loved one (bereavement), normal sadness and grief may last longer than two weeks. It usually gets better with time.  Clinical depression. This kind lasts longer than normal sadness or grief. It keeps you from doing the things you normally do in life. It is often hard to function at home, work, or at school. It may affect your relationships with others. Treatment is often needed. GET HELP RIGHT AWAY IF:  You have thoughts about hurting yourself or others.  You lose touch with reality (psychotic symptoms). You may:  See or hear things that are not real.  Have untrue beliefs about your life or people around you.  Your medicine is giving you problems. MAKE SURE YOU:  Understand these  instructions.  Will watch your condition.  Will get help right away if you are not doing well or get worse. Document Released: 01/15/2011 Document Revised: 09/06/2012 Document Reviewed: 04/13/2012 [facility] Patient Information 2014 [place], [place]. Gastroesophageal Reflux Disease, Adult Gastroesophageal reflux disease (GERD) happens when acid from your stomach goes into your food pipe (esophagus). The acid can cause a burning feeling in your chest. Over time, the acid can make small holes (ulcers) in your food pipe.  HOME CARE  Ask your doctor for advice about:  Losing weight.  Quitting smoking.  Alcohol use.  Avoid foods and drinks that make your problems worse. You may want to avoid:  Caffeine and alcohol.  Chocolate.  Mints.  Garlic and onions.  Spicy foods.  Citrus fruits, such as oranges, lemons, or limes.  Foods that contain tomato, such as sauce, chili, salsa, and pizza.  Fried and fatty foods.  Avoid lying down for 3 hours before you go to bed or before you take a nap.  Eat small meals often, instead of large meals.  Wear loose-fitting clothing. Do not wear anything tight around your waist.  Raise (elevate) the head of your bed 6 to 8 inches with wood blocks. Using extra pillows does not help.  Only take medicines as told by your doctor.  Do not take aspirin or ibuprofen. GET HELP RIGHT AWAY IF:   You have pain in your arms, neck, jaw, teeth, or back.  Your pain gets worse or changes.  You feel sick to your stomach (nauseous), throw up (vomit), or  sweat (diaphoresis).  You feel short of breath, or you pass out (faint).  Your throw up is green, yellow, black, or looks like coffee grounds or blood.  Your poop (stool) is red, bloody, or black. MAKE SURE YOU:   Understand these instructions.  Will watch your condition.  Will get help right away if you are not doing well or get worse. Document Released: 05/31/2008 Document Revised: 03/06/2012  Document Reviewed: 07/02/2011 [facility] Patient Information 2014 [place], [place]. Insomnia Insomnia means you have trouble falling or staying asleep. It affects about one person in three at different times and is usually related to stress from work, school, or personal relations. Insomnia is also a sign of depression or anxiety. Other medical problems that cause insomnia include conditions that cause pain, night leg cramps, coughing, shortness of breath, urinary problems, and fevers. Sleep apnea is an abnormal breathing pattern at night that can cause insomnia and loud snoring. Certain medications and excess intake of caffeine drinks (coffee, tea, colas) can also interfere with normal sleep. Treatment for insomnia depends on the cause. Besides specific medical treatment, the following measures can help you relax and get better sleep. Get regular exercise every day, at least several hours before bed time. Try to get to bed at the same time every night. Take a hot bath before retiring to help you relax. Do not stay in bed if you are unable to sleep. During the daytime avoid staying in bed to watch television, eat, or read. Reduce unwanted noise and light in your room. Keep your room at a comfortable temperature. Avoid alcohol as it causes one to sleep less soundly, may cause you to awaken during the night, and can leave you feeling groggy the next day. Using a mild sedative prescribed or suggested by your caregiver may be needed, but the daily use of sleeping pills is not recommended. Anti-depressant medicines can improve sleep in people with depression. Please call your doctor for follow up care to better understand the cause and proper treatment of your insomnia. Document Released: 01/20/2005 Document Revised: 03/06/2012 Document Reviewed: 12/13/2005 [facility] Patient Information 2014 [place], [place].

## 2014-01-22 ENCOUNTER — Institutional Professional Consult (permissible substitution): Payer: BC Managed Care – PPO | Admitting: Internal Medicine

## 2014-01-28 ENCOUNTER — Ambulatory Visit (INDEPENDENT_AMBULATORY_CARE_PROVIDER_SITE_OTHER): Payer: BC Managed Care – PPO | Admitting: Internal Medicine

## 2014-01-28 ENCOUNTER — Encounter (INDEPENDENT_AMBULATORY_CARE_PROVIDER_SITE_OTHER): Payer: Self-pay

## 2014-01-28 ENCOUNTER — Encounter: Payer: Self-pay | Admitting: Internal Medicine

## 2014-01-28 VITALS — BP 118/78 | HR 66 | Ht 59.0 in | Wt 116.6 lb

## 2014-01-28 DIAGNOSIS — G4700 Insomnia, unspecified: Secondary | ICD-10-CM

## 2014-01-28 DIAGNOSIS — F5104 Psychophysiologic insomnia: Secondary | ICD-10-CM

## 2014-01-28 MED ORDER — CLONAZEPAM 0.5 MG PO TABS: ORAL_TABLET | ORAL | Status: DC

## 2014-01-28 NOTE — Progress Notes (Signed)
11/27/14- 68 yoF never smoker referred courtesy of Dr Asa Lente for trouble sleeping.  [place] has [place]. She works as a Glass blower/designer , standing up. Not married , living with one of her 5 children.  Mother passed away this year  She describes feeling sleepy all the time but having difficulty initiating and maintaining sleep , and now for the past 20 years.  Never good stretches. Ambien 10 mg helps for only 2 or 3  Hours with no other meds taken.  Bedtime between 1 and 2 AM , sleep latency 30-60 minutes, waking 3 or 4 times before up at 6:30 AM. Tries to nap in the early afternoon but rarely falls asleep even if "tired". Not aware of snoring , taking or sleepwalking. Little caffeine. Does not know of any family members with similar complaint.  Tylenol PM caused tachycardia. She feels physically comfortable in bed.  Prior to Admission medications   Medication Sig Start Date End Date Taking? Authorizing Provider  acetaminophen (TYLENOL) 650 MG CR tablet Take 650 mg by mouth every 8 (eight) hours as needed for pain.   Yes Historical Provider, MD  atorvastatin (LIPITOR) 10 MG tablet Take 1 tablet (10 mg total) by mouth daily. 04/14/13  Yes Rowe Clack, MD  Calcium Carbonate-Vitamin D (CALCIUM-VITAMIN D3 PO) Take 1 each by mouth 2 (two) times daily.   Yes Historical Provider, MD  ranitidine (ZANTAC) 150 MG tablet Take 1 tablet (150 mg total) by mouth 2 (two) times daily. 12/21/13  Yes Rowe Clack, MD  zolpidem (AMBIEN) 10 MG tablet Take 0.5-1 tablets (5-10 mg total) by mouth at bedtime as needed for sleep. 04/13/13 01/28/14 Yes Rowe Clack, MD  clonazePAM Bobbye Charleston) 0.5 MG tablet 1-3 for sleep as needed 01/28/14   Deneise Lever, MD  diclofenac (CATAFLAM) 50 MG tablet Take 1 tablet (50 mg total) by mouth 2 (two) times daily. 02/01/14   Rowe Clack, MD  traZODone (DESYREL) 50 MG tablet Take 0.5-1 tablets (25-50 mg total) by mouth at bedtime as needed for  sleep. 02/01/14   Rowe Clack, MD   Past Medical History  Diagnosis Date  . Arthritis     R THR 2011  . Hyperlipidemia LDL goal < 130   . Pain of left thigh   . Back pain   . Left hip pain   . Insomnia   . Frequent headaches    Past Surgical History  Procedure Laterality Date  . 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 Tubal ligation    . Upper gastrointestinal endoscopy     Family History  Problem Relation Age of Onset  . CVA Mother   . Hypertension Mother    History   Social History  . Marital Status: Married    Spouse Name: N/A    Number of Children: 68  . Years of Education: N/A   Occupational History  . machine operator    Social History Main Topics  . Smoking status: Never Smoker   . Smokeless tobacco: Never Used  . Alcohol Use: No  . Drug Use: No  . Sexual Activity: Not on file   Other Topics Concern  . Not on file   Social History Narrative  . No narrative on file   ROS-see HPI Constitutional:   No-   weight loss, night sweats, fevers, chills, +fatigue, lassitude. HEENT:   No-  headaches, difficulty swallowing, tooth/dental problems, sore throat,  No-  sneezing, itching, ear ache, nasal congestion, post nasal drip,  CV:  No-   chest pain, orthopnea, PND, swelling in lower extremities, anasarca,                                  dizziness, palpitations Resp: No-   shortness of breath with exertion or at rest.              No-   productive cough,  No non-productive cough,  No- coughing up of blood.              No-   change in color of mucus.  No- wheezing.   Skin: No-   rash or lesions. GI:  No-   heartburn, indigestion, abdominal pain, nausea, vomiting, diarrhea,                 change in bowel habits, loss of appetite GU: No-   dysuria, change in color of urine, no urgency or frequency.  No- flank pain. MS:  No-   joint pain or swelling.  No- decreased range of motion.  No- back pain. Neuro-     nothing unusual Psych:  No- change  in mood or affect. No depression or anxiety.  No memory loss.  OBJ- Physical Exam General- Alert, Oriented, Affect-appropriate/ calm/ smile, Distress- none acute, slender Vietnamese Skin- rash-none, lesions- none, excoriation- none Lymphadenopathy- none Head- atraumatic            Eyes- Gross vision intact, PERRLA, conjunctivae and secretions clear            Ears- Hearing, canals-normal            Nose- Clear, no-Septal dev, mucus, polyps, erosion, perforation             Throat- Mallampati II , mucosa clear , drainage- none, tonsils- atrophic Neck- flexible , trachea midline, no stridor , thyroid nl, carotid no bruit Chest - symmetrical excursion , unlabored           Heart/CV- RRR , no murmur , no gallop  , no rub, nl s1 s2                           - JVD- none , edema- none, stasis changes- none, varices- none           Lung- clear to P&A, wheeze- none, cough- none , dullness-none, rub- none           Chest wall-  Abd- tender-no, distended-no, bowel sounds-present, HSM- no Br/ Gen/ Rectal- Not done, not indicated Extrem- cyanosis- none, clubbing, none, atrophy- none, strength- nl Neuro- grossly intact to observation

## 2014-01-28 NOTE — Patient Instructions (Signed)
Instead of Ambien, try script for clonazepam. Start with one per night, taken 20-30 minutes before bedtime. You can take up to a maximum of 3 per night. Don't take it later than bedtime. This pill can last longer in your body, so we don't want it to leave you even sleepier the next day.

## 2014-02-01 ENCOUNTER — Encounter: Payer: Self-pay | Admitting: Internal Medicine

## 2014-02-01 ENCOUNTER — Ambulatory Visit (INDEPENDENT_AMBULATORY_CARE_PROVIDER_SITE_OTHER): Payer: BC Managed Care – PPO | Admitting: Internal Medicine

## 2014-02-01 ENCOUNTER — Encounter: Payer: Self-pay | Admitting: *Deleted

## 2014-02-01 VITALS — BP 110/80 | HR 63 | Temp 97.2°F | Wt 118.0 lb

## 2014-02-01 DIAGNOSIS — M169 Osteoarthritis of hip, unspecified: Secondary | ICD-10-CM

## 2014-02-01 DIAGNOSIS — F3289 Other specified depressive episodes: Secondary | ICD-10-CM

## 2014-02-01 DIAGNOSIS — M160 Bilateral primary osteoarthritis of hip: Secondary | ICD-10-CM

## 2014-02-01 DIAGNOSIS — M1610 Unilateral primary osteoarthritis, unspecified hip: Secondary | ICD-10-CM

## 2014-02-01 DIAGNOSIS — F5104 Psychophysiologic insomnia: Secondary | ICD-10-CM

## 2014-02-01 DIAGNOSIS — F32A Depression, unspecified: Secondary | ICD-10-CM

## 2014-02-01 DIAGNOSIS — F329 Major depressive disorder, single episode, unspecified: Secondary | ICD-10-CM

## 2014-02-01 DIAGNOSIS — G4700 Insomnia, unspecified: Secondary | ICD-10-CM

## 2014-02-01 MED ORDER — TRAZODONE HCL 50 MG PO TABS: 25.0000 mg | ORAL_TABLET | Freq: Every evening | ORAL | Status: DC | PRN

## 2014-02-01 MED ORDER — DICLOFENAC POTASSIUM 50 MG PO TABS: 50.0000 mg | ORAL_TABLET | Freq: Two times a day (BID) | ORAL | Status: DC

## 2014-02-01 NOTE — Assessment & Plan Note (Signed)
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

## 2014-02-01 NOTE — Progress Notes (Signed)
Maria Barker [id number] 02/01/2014   Chief Complaint  Patient presents with  . Follow-up    6 weeks    Subjective  HPI Patient visit today is a 6 week follow up following the rx of amitriptyline and daily low-dose paroxetine for depression and chronic insomina.  Patient reports intolerable side effects of mental slowness,  inability to pay attention "feeling out it."  Patient was referred to Dr. Annamaria Boots (pulmonology) for evualation of chronic insomnia.  Dr Annamaria Boots prescribed Clonopin  Every night to help with sleep.  Patient reports she has not tried this medication yet ans wishes to wait till a weekend so insure she is mentally alert for her job.   Patient reports a decrease in her depression symptoms since the holiday have passed.  She requests a refill on her diclofenac for her hip pain. No SI/HI.  No new complaints.   Past Medical History  Diagnosis Date  . Arthritis     R THR 2011  . Hyperlipidemia LDL goal < 130   . Pain of left thigh   . Back pain   . Left hip pain   . Insomnia   . Frequent headaches     Past Surgical History  Procedure Laterality Date  . 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 Tubal ligation    . Upper gastrointestinal endoscopy      Family History  Problem Relation Age of Onset  . CVA Mother   . Hypertension Mother     History  Substance Use Topics  . Smoking status: Never Smoker   . Smokeless tobacco: Never Used  . Alcohol Use: No    Current Outpatient Prescriptions on File Prior to Visit  Medication Sig Dispense Refill  . acetaminophen (TYLENOL) 650 MG CR tablet Take 650 mg by mouth every 8 (eight) hours as needed for pain.      Marland Kitchen amitriptyline (ELAVIL) 10 MG tablet Take 1 tablet (10 mg total) by mouth at bedtime.  30 tablet  3  . atorvastatin (LIPITOR) 10 MG tablet Take 1 tablet (10 mg total) by mouth daily.  90 tablet  3  . Calcium Carbonate-Vitamin D (CALCIUM-VITAMIN D3 PO) Take 1 each by mouth 2 (two) times daily.      .  clonazePAM (KLONOPIN) 0.5 MG tablet 1-3 for sleep as needed  30 tablet  1  . PARoxetine (PAXIL) 10 MG tablet Take 1 tablet (10 mg total) by mouth daily.  30 tablet  3  . ranitidine (ZANTAC) 150 MG tablet Take 1 tablet (150 mg total) by mouth 2 (two) times daily.  60 tablet  1  . zolpidem (AMBIEN) 10 MG tablet Take 0.5-1 tablets (5-10 mg total) by mouth at bedtime as needed for sleep.  30 tablet  5   No current facility-administered medications on file prior to visit.    Allergies:  Allergies  Allergen Reactions  . Asa [Aspirin]     UPSET STOMACH    Review of Systems  Constitutional: Negative for fever, chills, weight loss and malaise/fatigue.  HENT: Negative for congestion and nosebleeds.   Eyes: Negative for blurred vision.  Respiratory: Negative for cough, shortness of breath and wheezing.   Cardiovascular: Negative for chest pain.  Gastrointestinal: Negative for heartburn, nausea and vomiting.  Musculoskeletal: Joint pain: Chronic- Hip.  Neurological: Negative for speech change and headaches.  Psychiatric/Behavioral: Negative for depression and suicidal ideas. The patient is not nervous/anxious and does not have insomnia.  Objective  Filed Vitals:   02/01/14 0910  BP: 110/80  Pulse: 63  Temp: 97.2 F (36.2 C)  TempSrc: Oral  Weight: 118 lb (53.524 kg)  SpO2: 98%    Physical Exam  Nursing note and vitals reviewed. Constitutional: She is oriented to person, place, and time. She appears well-developed and well-nourished. No distress.  HENT:  Head: Normocephalic and atraumatic.  Eyes: Pupils are equal, round, and reactive to light.  Neck: No JVD present.  Cardiovascular: Normal rate, regular rhythm and normal heart sounds.  Exam reveals no gallop and no friction rub.   No murmur heard. Respiratory: Effort normal and breath sounds normal. No stridor. No respiratory distress. She has no wheezes. She exhibits no tenderness.  Musculoskeletal: Normal range of  motion. She exhibits no tenderness.  Lymphadenopathy:    She has no cervical adenopathy.  Neurological: She is alert and oriented to person, place, and time.  Skin: She is not diaphoretic.  Psychiatric: She has a normal mood and affect. Her speech is normal and behavior is normal. Judgment and thought content normal. Cognition and memory are normal.    BP Readings from Last 3 Encounters:  02/01/14 110/80  01/28/14 118/78  12/21/13 138/80    Wt Readings from Last 3 Encounters:  02/01/14 118 lb (53.524 kg)  01/28/14 116 lb 9.6 oz (52.889 kg)  12/21/13 114 lb 6.4 oz (51.891 kg)    Lab Results  Component Value Date   WBC 8.9 12/21/2013   HGB 12.0 12/21/2013   HCT 36.6 12/21/2013   PLT 347.0 12/21/2013   GLUCOSE 82 12/21/2013   CHOL 161 12/21/2013   TRIG 214.0* 12/21/2013   HDL 49.10 12/21/2013   LDLDIRECT 93.7 12/21/2013   LDLCALC 154* 08/11/2010   ALT 28 12/21/2013   AST 30 12/21/2013   NA 136 12/21/2013   K 3.8 12/21/2013   CL 105 12/21/2013   CREATININE 0.5 12/21/2013   BUN 14 12/21/2013   CO2 26 12/21/2013   TSH 4.63 12/21/2013   INR 1.59* 04/02/2010    No results found.   Assessment and Plan  Chronic Insomnia - Reviewed past medical interventions and recent labs.  Patient reports intolerable side effects of amitriptyline and daily low-dose paroxetine.  Patient has not tried Clonazepam prescribed by Dr. Annamaria Boots (01/28/2014).  Recommended a trial of the clonazepam for a couple of days.  Wrote a prescription for Trazodone if clonazepam proves ineffective in aiding sleep. No furthur labs needed/ordered today.    Dyslipidemia - Patient reports compliance with medication.  Recent labs show effective dose.  The current medical regimen is effective;  continue present plan and medications.  Osteoarthritis -hip - patients takes Diclofenac prn for hip pain.  Refill sent today.  No new complaints. The current medical regimen is effective;  continue present plan and  medications.  Patient was instructed to notify the office if any new symptoms emerged or the current symptoms become worse.  Return in about 1 year (around 02/01/2015) for annual exam and labs.  Donna Christen    I have personally reviewed this case with PA student. I also personally examined this patient. I agree with history and findings as documented above. I reviewed, discussed and approve of the assessment and plan as listed above. Gwendolyn Grant, MD

## 2014-02-01 NOTE — Patient Instructions (Signed)
It was good to see you today.  We have reviewed your prior records including labs and tests today  Medications reviewed -refill on diclofenac for hip pain and begin trazodone as needed for sleep (if clonazepam ineffective) - Your prescription(s) have been submitted to your pharmacy. Please take as directed and contact our office if you believe you are having problem(s) with the medication(s).  followup with Dr. 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

## 2014-02-01 NOTE — Assessment & Plan Note (Addendum)
Chronic issue over 15 years reviewed Has previously used Ambien for same in past with good results, But less effective seasonal depression flare 2014 Evaluation by sleep specialist early 01/2014 reviewed - given clonazepam trial in place of zolpidem but has not yet tried/begun same trial low-dose amitriptyline with paxil 11/2013 also poorly tolerated - not taking same Will try trazadone if clonazepam ineffective and advise follow up with sleep specialist Annamaria Boots) for further evaluation and treatment of chronic issue

## 2014-02-01 NOTE — Assessment & Plan Note (Signed)
Chronic low-grade symptoms, seasonal exacerbation 2014 by holidays and family stressors Intolerant of low-dose amitriptyline and daily low-dose paroxetine trial 11/2013 Will try low dose traz to focus on sleep issues specifically (if clonazepam ineffective, see above) verified no SI/HI

## 2014-02-01 NOTE — Progress Notes (Signed)
Pre-visit discussion using our clinic review tool. No additional management support is needed unless otherwise documented below in the visit note.  

## 2014-02-01 NOTE — Progress Notes (Signed)
Subjective:    Patient ID: Maria Barker, female    DOB: [birth year], 53 y.o.   MRN: [id number]  HPI  Patient is here for follow up  Reviewed chronic medical issues and interval medical events  Past Medical History  Diagnosis Date  . Arthritis     R THR 2011  . Hyperlipidemia LDL goal < 130   . Pain of left thigh   . Back pain   . Left hip pain   . Insomnia   . Frequent headaches     Review of Systems  Constitutional: Negative for fever and fatigue.  Respiratory: Negative for cough and shortness of breath.   Cardiovascular: Negative for chest pain, palpitations and leg swelling.  Musculoskeletal: Positive for arthralgias (hip, chronic, controlled with NSAID prn). Negative for joint swelling.  Psychiatric/Behavioral: Positive for sleep disturbance (chronic). Negative for dysphoric mood and decreased concentration.       Objective:   Physical Exam  BP 110/80  Pulse 63  Temp(Src) 97.2 F (36.2 C) (Oral)  Wt 118 lb (53.524 kg)  SpO2 98% Wt Readings from Last 3 Encounters:  02/01/14 118 lb (53.524 kg)  01/28/14 116 lb 9.6 oz (52.889 kg)  12/21/13 114 lb 6.4 oz (51.891 kg)    Constitutional: She appears well-developed and well-nourished. No distress.  Neck: Normal range of motion. Neck supple. No JVD present. No thyromegaly present.  Cardiovascular: Normal rate, regular rhythm and normal heart sounds.  No murmur heard. No BLE edema. Pulmonary/Chest: Effort normal and breath sounds normal. No respiratory distress. She has no wheezes.  Psychiatric: She has a normal mood and affect. Her behavior is normal. Judgment and thought content normal.   Lab Results  Component Value Date   WBC 8.9 12/21/2013   HGB 12.0 12/21/2013   HCT 36.6 12/21/2013   PLT 347.0 12/21/2013   GLUCOSE 82 12/21/2013   CHOL 161 12/21/2013   TRIG 214.0* 12/21/2013   HDL 49.10 12/21/2013   LDLDIRECT 93.7 12/21/2013   LDLCALC 154* 08/11/2010   ALT 28 12/21/2013   AST 30 12/21/2013   NA 136  12/21/2013   K 3.8 12/21/2013   CL 105 12/21/2013   CREATININE 0.5 12/21/2013   BUN 14 12/21/2013   CO2 26 12/21/2013   TSH 4.63 12/21/2013   INR 1.59* 04/02/2010    No results found.     Assessment & Plan:   Medications and labs reviewed today.  Problem List Items Addressed This Visit   Chronic insomnia - Primary     Chronic issue over 15 years reviewed Has previously used Ambien for same in past with good results, But less effective seasonal depression flare 2014 Evaluation by sleep specialist early 01/2014 reviewed - given clonazepam trial in place of zolpidem but has not yet tried/begun same trial low-dose amitriptyline with paxil 11/2013 also poorly tolerated - not taking same Will try trazadone if clonazepam ineffective and advise follow up with sleep specialist Annamaria Boots) for further evaluation and treatment of chronic issue    Depression     Chronic low-grade symptoms, seasonal exacerbation 2014 by holidays and family stressors Intolerant of low-dose amitriptyline and daily low-dose paroxetine trial 11/2013 Will try low dose traz to focus on sleep issues specifically (if clonazepam ineffective, see above) verified no SI/HI    Relevant Medications      traZODone (DESYREL) tablet   Osteoarthritis, hip, bilateral     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

## 2014-02-21 NOTE — Assessment & Plan Note (Signed)
Chronic insomnia , probably linked to a component of stress and depression.  Plan-  Try replacing Ambien with clonazepam for comparison with a longer half-life. Medication talked done. Consider cognitive behavioral therapy which might help with the depression component

## 2014-02-27 ENCOUNTER — Encounter: Payer: Self-pay | Admitting: Internal Medicine

## 2014-02-27 ENCOUNTER — Ambulatory Visit (INDEPENDENT_AMBULATORY_CARE_PROVIDER_SITE_OTHER): Payer: BC Managed Care – PPO | Admitting: Internal Medicine

## 2014-02-27 VITALS — BP 110/68 | HR 65 | Ht 59.0 in | Wt 118.8 lb

## 2014-02-27 DIAGNOSIS — F5104 Psychophysiologic insomnia: Secondary | ICD-10-CM

## 2014-02-27 DIAGNOSIS — F32A Depression, unspecified: Secondary | ICD-10-CM

## 2014-02-27 DIAGNOSIS — F3289 Other specified depressive episodes: Secondary | ICD-10-CM

## 2014-02-27 DIAGNOSIS — G4700 Insomnia, unspecified: Secondary | ICD-10-CM

## 2014-02-27 DIAGNOSIS — F329 Major depressive disorder, single episode, unspecified: Secondary | ICD-10-CM

## 2014-02-27 MED ORDER — CLONAZEPAM 0.5 MG PO TABS: ORAL_TABLET | ORAL | Status: DC

## 2014-02-27 NOTE — Patient Instructions (Signed)
Script to refill clonazepam. Use the lowest amount that helps you and skip it if you don't need it.

## 2014-02-27 NOTE — Progress Notes (Signed)
11/27/14- 36 yoF never smoker referred courtesy of Dr Asa Lente for trouble sleeping.  patient has been in this [place]. She works as a Glass blower/designer , standing up. Not married , living with one of her 5 children.  Mother passed away this year  She describes feeling sleepy all the time but having difficulty initiating and maintaining sleep , and now for the past 20 years.  Never good stretches. Ambien 10 mg helps for only 2 or 3  Hours with no other meds taken.  Bedtime between 1 and 2 AM , sleep latency 30-60 minutes, waking 3 or 4 times before up at 6:30 AM. Tries to nap in the early afternoon but rarely falls asleep even if "tired". Not aware of snoring , taking or sleepwalking. Little caffeine. Does not know of any family members with similar complaint.  Tylenol PM caused tachycardia. She feels physically comfortable in bed.  02/27/14- 56 yoF never smoker followed for chronic insomnia FOLLOWS FOR:Pt states the clonazepam helps her to sleep but she feels like she has become more forgetful. Pt has now started using the clonazepam as needed and this has helped the forgetful feelings Using clonazepam 2x0.5 mg and trazodone 50 mg qhs(for depression). Admits better sleep with these, but trazodone "not myself".  ROS-see HPI Constitutional:   No-   weight loss, night sweats, fevers, chills, +fatigue, lassitude. HEENT:   No-  headaches, difficulty swallowing, tooth/dental problems, sore throat,       No-  sneezing, itching, ear ache, nasal congestion, post nasal drip,  CV:  No-   chest pain, orthopnea, PND, swelling in lower extremities, anasarca, dizziness, palpitations Resp: No-   shortness of breath with exertion or at rest.              No-   productive cough,  No non-productive cough,  No- coughing up of blood.              No-   change in color of mucus.  No- wheezing.   Skin: No-   rash or lesions. GI:  No-   heartburn, indigestion, abdominal pain, nausea, vomiting,  GU: . MS:  No-    joint pain or swelling.  . Neuro-     nothing unusual Psych:  No- change in mood or affect. No depression or anxiety.  No memory loss.  OBJ- Physical Exam General- Alert, Oriented, Affect-appropriate/ calm/ smile, Distress- none acute, slender Vietnamese Skin- rash-none, lesions- none, excoriation- none Lymphadenopathy- none Head- atraumatic            Eyes- Gross vision intact, PERRLA, conjunctivae and secretions clear            Ears- Hearing, canals-normal            Nose- Clear, no-Septal dev, mucus, polyps, erosion, perforation             Throat- Mallampati II , mucosa clear , drainage- none, tonsils- atrophic Neck- flexible , trachea midline, no stridor , thyroid nl, carotid no bruit Chest - symmetrical excursion , unlabored           Heart/CV- RRR , no murmur , no gallop  , no rub, nl s1 s2                           - JVD- none , edema- none, stasis changes- none, varices- none           Lung- clear  to P&A, wheeze- none, cough- none , dullness-none, rub- none           Chest wall-  Abd-  Br/ Gen/ Rectal- Not done, not indicated Extrem- cyanosis- none, clubbing, none, atrophy- none, strength- nl Neuro- grossly intact to observation

## 2014-03-19 NOTE — Assessment & Plan Note (Signed)
Plan- Can adjust clonazepam 1-1.5-2 mg as needed. Education and review of good sleep hygiene.

## 2014-03-19 NOTE — Assessment & Plan Note (Signed)
She wants to come off Trazodone, and will speak to the presciber.

## 2014-04-17 ENCOUNTER — Encounter: Payer: Self-pay | Admitting: Internal Medicine

## 2014-04-17 ENCOUNTER — Ambulatory Visit (INDEPENDENT_AMBULATORY_CARE_PROVIDER_SITE_OTHER): Payer: BC Managed Care – PPO | Admitting: Internal Medicine

## 2014-04-17 VITALS — BP 110/70 | HR 67 | Temp 97.5°F | Wt 116.8 lb

## 2014-04-17 DIAGNOSIS — M545 Low back pain, unspecified: Secondary | ICD-10-CM

## 2014-04-17 DIAGNOSIS — M169 Osteoarthritis of hip, unspecified: Secondary | ICD-10-CM

## 2014-04-17 DIAGNOSIS — M1610 Unilateral primary osteoarthritis, unspecified hip: Secondary | ICD-10-CM

## 2014-04-17 DIAGNOSIS — M160 Bilateral primary osteoarthritis of hip: Secondary | ICD-10-CM

## 2014-04-17 MED ORDER — DICLOFENAC POTASSIUM 50 MG PO TABS: 50.0000 mg | ORAL_TABLET | Freq: Two times a day (BID) | ORAL | Status: DC

## 2014-04-17 MED ORDER — CYCLOBENZAPRINE HCL 5 MG PO TABS: 5.0000 mg | ORAL_TABLET | Freq: Two times a day (BID) | ORAL | Status: DC | PRN

## 2014-04-17 NOTE — Patient Instructions (Addendum)
It was good to see you today.  We have reviewed your prior records including labs and tests today  Medications reviewed and updated Muscle relaxer twice daily, especially bedtime, to treat muscle spasm contributing to back pain. Also okay to continue diclofenac as needed for back pain or hip pain  Your prescription(s)/refills have been submitted to your pharmacy. Please take as directed and contact our office if you believe you are having problem(s) with the medication(s).  Perform back exercises at home in next several days when pain has improved -see below  Call if pain symptoms unimproved in next 6 weeks, sooner if worse Back Exercises Back exercises help treat and prevent back injuries. The goal is to increase your strength in your belly (abdominal) and back muscles. These exercises can also help with flexibility. Start these exercises when told by your doctor. HOME CARE Back exercises include: Pelvic Tilt.  Lie on your back with your knees bent. Tilt your pelvis until the lower part of your back is against the floor. Hold this position 5 to 10 sec. Repeat this exercise 5 to 10 times. Knee to Chest.  Pull 1 knee up against your chest and hold for 20 to 30 seconds. Repeat this with the other knee. This may be done with the other leg straight or bent, whichever feels better. Then, pull both knees up against your chest. Sit-Ups or Curl-Ups.  Bend your knees 90 degrees. Start with tilting your pelvis, and do a partial, slow sit-up. Only lift your upper half 30 to 45 degrees off the floor. Take at least 2 to 3 seonds for each sit-up. Do not do sit-ups with your knees out straight. If partial sit-ups are difficult, simply do the above but with only tightening your belly (abdominal) muscles and holding it as told. Hip-Lift.  Lie on your back with your knees flexed 90 degrees. Push down with your feet and shoulders as you raise your hips 2 inches off the floor. Hold for 10 seconds, repeat 5 to  10 times. Back Arches.  Lie on your stomach. Prop yourself up on bent elbows. Slowly press on your hands, causing an arch in your low back. Repeat 3 to 5 times. Shoulder-Lifts.  Lie face down with arms beside your body. Keep hips and belly pressed to floor as you slowly lift your head and shoulders off the floor. Do not overdo your exercises. Be careful in the beginning. Exercises may cause you some mild back discomfort. If the pain lasts for more than 15 minutes, stop the exercises until you see your doctor. Improvement with exercise for back problems is slow.  Document Released: 01/15/2011 Document Revised: 03/06/2012 Document Reviewed: 10/14/2011 [facility] Patient Information 2014 [place], [place].

## 2014-04-17 NOTE — Progress Notes (Signed)
Pre visit review using our clinic review tool, if applicable. No additional management support is needed unless otherwise documented below in the visit note. 

## 2014-04-17 NOTE — Progress Notes (Signed)
Subjective:    Patient ID: Maria Barker, female    DOB: 19-Oct-1961, 53 y.o.   MRN: [id number]  Back Pain This is a recurrent problem. The current episode started 1 to 4 weeks ago. The problem occurs intermittently. The problem has been gradually improving since onset. The pain is present in the lumbar spine. The quality of the pain is described as cramping and aching. The pain does not radiate. The pain is mild. The symptoms are aggravated by twisting and standing. Stiffness is present all day. Pertinent negatives include no abdominal pain, chest pain, dysuria, fever, headaches, leg pain (chronic hip pain, unchanged), numbness, tingling, weakness or weight loss. Risk factors: none. She has tried NSAIDs and bed rest for the symptoms. The treatment provided moderate relief.    Reviewed chronic medical issues and interval medical events  Past Medical History  Diagnosis Date  . Arthritis     R THR 2011  . Hyperlipidemia LDL goal < 130   . Pain of left thigh   . Back pain   . Left hip pain   . Insomnia   . Frequent headaches     Review of Systems  Constitutional: Negative for fever and weight loss.  Cardiovascular: Negative for chest pain.  Gastrointestinal: Negative for abdominal pain.  Genitourinary: Negative for dysuria.  Musculoskeletal: Positive for back pain.  Neurological: Negative for tingling, weakness, numbness and headaches.       Objective:   Physical Exam  BP 110/70  Pulse 67  Temp(Src) 97.5 F (36.4 C) (Oral)  Wt 116 lb 12.8 oz (52.98 kg)  SpO2 97% Wt Readings from Last 3 Encounters:  04/17/14 116 lb 12.8 oz (52.98 kg)  02/27/14 118 lb 12.8 oz (53.887 kg)  02/01/14 118 lb (53.524 kg)    Constitutional: She appears well-developed and well-nourished. No distress.  Neck: Normal range of motion. Neck supple. No JVD present. No thyromegaly present.  Cardiovascular: Normal rate, regular rhythm and normal heart sounds.  No murmur heard. No BLE  edema. Pulmonary/Chest: Effort normal and breath sounds normal. No respiratory distress. She has no wheezes.  MSkel: Back: full range of motion of thoracic and lumbar spine. Non tender to palpation over vertebra but +paraspibal spasm along L lumbar region. Negative straight leg raise. DTR's are symmetrically intact. Sensation intact in all dermatomes of the lower extremities. Full strength to manual muscle testing. patient is able to heel toe walk without difficulty and ambulates with antalgic gait. Psychiatric: She has a normal mood and affect. Her behavior is normal. Judgment and thought content normal.   Lab Results  Component Value Date   WBC 8.9 12/21/2013   HGB 12.0 12/21/2013   HCT 36.6 12/21/2013   PLT 347.0 12/21/2013   GLUCOSE 82 12/21/2013   CHOL 161 12/21/2013   TRIG 214.0* 12/21/2013   HDL 49.10 12/21/2013   LDLDIRECT 93.7 12/21/2013   LDLCALC 154* 08/11/2010   ALT 28 12/21/2013   AST 30 12/21/2013   NA 136 12/21/2013   K 3.8 12/21/2013   CL 105 12/21/2013   CREATININE 0.5 12/21/2013   BUN 14 12/21/2013   CO2 26 12/21/2013   TSH 4.63 12/21/2013   INR 1.59* 04/02/2010    No results found.     Assessment & Plan:   Lumbar back pain. Exam and history consistent with muscle spasm and strain. No neuro or musculoskeletal deficits. Treat with muscle relaxer and NSAIDs when necessary breakthrough pain. Back exercises instruction provided, to begin once symptoms have improved.  Patient with no history of prior back problems, ruptured disc, no prior surgical intervention or injection therapy. 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

## 2014-04-17 NOTE — Assessment & Plan Note (Signed)
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

## 2014-04-29 ENCOUNTER — Telehealth: Payer: Self-pay | Admitting: *Deleted

## 2014-04-29 NOTE — Telephone Encounter (Signed)
Pt called states she lost her Diclofenac medication.  She is requesting an early refill.  Please advise

## 2014-04-30 MED ORDER — DICLOFENAC POTASSIUM 50 MG PO TABS: 50.0000 mg | ORAL_TABLET | Freq: Two times a day (BID) | ORAL | Status: DC

## 2014-04-30 NOTE — Telephone Encounter (Signed)
Spoke with pt advised Rx sent 

## 2014-04-30 NOTE — Telephone Encounter (Signed)
Ok erx done

## 2014-07-01 ENCOUNTER — Ambulatory Visit: Payer: BC Managed Care – PPO | Admitting: Internal Medicine

## 2014-07-30 ENCOUNTER — Telehealth: Payer: Self-pay | Admitting: Internal Medicine

## 2014-07-30 ENCOUNTER — Other Ambulatory Visit: Payer: Self-pay

## 2014-07-30 MED ORDER — ATORVASTATIN CALCIUM 10 MG PO TABS: 10.0000 mg | ORAL_TABLET | Freq: Every day | ORAL | Status: DC

## 2014-07-30 MED ORDER — RANITIDINE HCL 150 MG PO TABS: 150.0000 mg | ORAL_TABLET | Freq: Two times a day (BID) | ORAL | Status: DC

## 2014-07-30 NOTE — Telephone Encounter (Signed)
Pt's daughter call request refill for Atorvastatin and Ranitidine. Pt has an appt 02/01/2015 for CPE. Please advise. Pt request from the pharmacy but they told her to contact our office.

## 2014-10-08 ENCOUNTER — Other Ambulatory Visit (INDEPENDENT_AMBULATORY_CARE_PROVIDER_SITE_OTHER): Payer: BC Managed Care – PPO

## 2014-10-08 ENCOUNTER — Encounter: Payer: Self-pay | Admitting: Internal Medicine

## 2014-10-08 ENCOUNTER — Other Ambulatory Visit: Payer: Self-pay | Admitting: Internal Medicine

## 2014-10-08 ENCOUNTER — Ambulatory Visit (INDEPENDENT_AMBULATORY_CARE_PROVIDER_SITE_OTHER): Payer: BC Managed Care – PPO | Admitting: Internal Medicine

## 2014-10-08 VITALS — BP 120/82 | HR 61 | Temp 97.8°F | Wt 117.1 lb

## 2014-10-08 DIAGNOSIS — R51 Headache: Secondary | ICD-10-CM

## 2014-10-08 DIAGNOSIS — R519 Headache, unspecified: Secondary | ICD-10-CM

## 2014-10-08 DIAGNOSIS — R700 Elevated erythrocyte sedimentation rate: Secondary | ICD-10-CM

## 2014-10-08 DIAGNOSIS — Z8669 Personal history of other diseases of the nervous system and sense organs: Secondary | ICD-10-CM

## 2014-10-08 LAB — SEDIMENTATION RATE: Sed Rate: 51 mm/hr — ABNORMAL HIGH (ref 0–22)

## 2014-10-08 MED ORDER — TRAMADOL HCL 50 MG PO TABS: 50.0000 mg | ORAL_TABLET | Freq: Four times a day (QID) | ORAL | Status: DC | PRN

## 2014-10-08 NOTE — Progress Notes (Signed)
Subjective:    Patient ID: Maria Barker, female    DOB: [birth year], 53 y.o.   MRN: [id number]  HPI    She describes a headache involving the frontal half of the skull extending to the supraorbital area. It does not involve the posterior half of skull.  There was no injury or trigger prior to the symptoms  It has been associated with nausea and dizziness  She had blurred vision 10/03/14  It isdescribed as tight ,up to a level X. It does not radiate. It can last all day.  She treated it with ice, Tylenol,  & Excedrin. Excedrin 2 pills twice a day for 3 days was without benefit. Tylenol helped only day 1.  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  No family history of migraines.   Review of Systems   She has chronic left hip pain for which she takes diclofenac.  She denies double vision or loss of vision  She's had no tinnitus or hearing loss.  There's been no change in sense of taste or smell.  She denies any numbness, tingling, weakness in the extremities.       Objective:   Physical Exam  Positive pertinent findings include: There is thinning of eyebrows laterally There is tenderness to palpation over the temples bilaterally.    Gen.: Healthy and well-nourished in appearance. Alert, appropriate and cooperative throughout exam. Appears younger than stated age  Head: Normocephalic without obvious abnormalities  Eyes: No corneal or conjunctival inflammation noted. Pupils equal round reactive to light and accommodation. Extraocular motion intact. Field of Vision grossly normal  Ears: External  ear exam reveals no significant lesions or deformities. Canals clear .TMs normal. Hearing is grossly normal bilaterally.Tuning  fork exam WNL Nose: External nasal exam reveals no deformity or inflammation. Nasal mucosa are pink and moist. No lesions or exudates noted.   Mouth: Oral mucosa and oropharynx reveal no lesions or exudates. Teeth in good repair. Neck: No deformities, masses, or tenderness noted. Range of motion & Thyroid normal Lungs: Normal respiratory effort; chest expands symmetrically. Lungs are clear to auscultation without rales, wheezes, or increased work of breathing. Heart: Normal rate and rhythm. Normal S1 and S2. No gallop, click, or rub. No murmur.                          Musculoskeletal/extremities: No deformity or scoliosis noted of  the thoracic or lumbar spine. . No clubbing, cyanosis, edema, or significant extremity  deformity noted. Range of motion normal .Tone & strength normal. Hand joints normal.  Fingernail  health good. Able to lie down & sit up w/o help. Negative SLR bilaterally Vascular: Carotid, radial artery, dorsalis pedis and  posterior tibial pulses are full and equal. No bruits present. Neurologic: Alert and oriented x3. Deep tendon reflexes symmetrical and normal.  Gait normal  including heel & toe walking . Rhomberg & finger to nose negative. No cranial nerve deficit. Skin: Intact without suspicious lesions or rashes. Lymph: No cervical, axillary lymphadenopathy present. Psych: Mood and affect are normal. Normally interactive  Assessment & Plan:  #1 headache described as daily and involving the anterior aspect of the head  #2 blurred vision  #3 tenderness to palpation of the temples  Plan: See orders and recommendations

## 2014-10-08 NOTE — Patient Instructions (Signed)

## 2014-10-08 NOTE — Progress Notes (Signed)
Pre visit review using our clinic review tool, if applicable. No additional management support is needed unless otherwise documented below in the visit note. 

## 2014-11-19 ENCOUNTER — Other Ambulatory Visit: Payer: Self-pay | Admitting: Internal Medicine

## 2014-11-19 NOTE — Telephone Encounter (Signed)
Faxed scripts back to [facility]...Maria Barker

## 2015-02-04 ENCOUNTER — Ambulatory Visit (INDEPENDENT_AMBULATORY_CARE_PROVIDER_SITE_OTHER): Payer: 59 | Admitting: Internal Medicine

## 2015-02-04 ENCOUNTER — Encounter: Payer: Self-pay | Admitting: Internal Medicine

## 2015-02-04 ENCOUNTER — Other Ambulatory Visit (INDEPENDENT_AMBULATORY_CARE_PROVIDER_SITE_OTHER): Payer: 59

## 2015-02-04 VITALS — BP 130/82 | HR 77 | Temp 97.7°F | Ht 59.0 in | Wt 118.8 lb

## 2015-02-04 DIAGNOSIS — M5441 Lumbago with sciatica, right side: Secondary | ICD-10-CM

## 2015-02-04 DIAGNOSIS — Z0000 Encounter for general adult medical examination without abnormal findings: Secondary | ICD-10-CM

## 2015-02-04 DIAGNOSIS — Z23 Encounter for immunization: Secondary | ICD-10-CM

## 2015-02-04 LAB — URINALYSIS, ROUTINE W REFLEX MICROSCOPIC
Bilirubin Urine: NEGATIVE
Ketones, ur: NEGATIVE
NITRITE: NEGATIVE
Specific Gravity, Urine: <=1.005 — AB (ref 1.000–1.030)
Total Protein, Urine: NEGATIVE
Urine Glucose: NEGATIVE
Urobilinogen, UA: 0.2 (ref 0.0–1.0)
pH: 7 (ref 5.0–8.0)

## 2015-02-04 LAB — HEPATIC FUNCTION PANEL
ALK PHOS: 112 U/L (ref 39–117)
ALT: 59 U/L — ABNORMAL HIGH (ref 0–35)
AST: 54 U/L — AB (ref 0–37)
Albumin: 4 g/dL (ref 3.5–5.2)
Bilirubin, Direct: 0.1 mg/dL (ref 0.0–0.3)
Total Bilirubin: 0.5 mg/dL (ref 0.2–1.2)
Total Protein: 8.5 g/dL — ABNORMAL HIGH (ref 6.0–8.3)

## 2015-02-04 LAB — CBC WITH DIFFERENTIAL/PLATELET
BASOS PCT: 0.9 % (ref 0.0–3.0)
Basophils Absolute: 0.1 10*3/uL (ref 0.0–0.1)
Eosinophils Absolute: 2.4 10*3/uL — ABNORMAL HIGH (ref 0.0–0.7)
HCT: 35.3 % — ABNORMAL LOW (ref 36.0–46.0)
Hemoglobin: 11.9 g/dL — ABNORMAL LOW (ref 12.0–15.0)
LYMPHS PCT: 11.3 % — AB (ref 12.0–46.0)
Lymphs Abs: 1.6 10*3/uL (ref 0.7–4.0)
MCHC: 33.6 g/dL (ref 30.0–36.0)
MCV: 80.6 fl (ref 78.0–100.0)
MONOS PCT: 7.5 % (ref 3.0–12.0)
Monocytes Absolute: 1.1 10*3/uL — ABNORMAL HIGH (ref 0.1–1.0)
NEUTROS ABS: 9 10*3/uL — AB (ref 1.4–7.7)
Neutrophils Relative %: 63.3 % (ref 43.0–77.0)
Platelets: 362 10*3/uL (ref 150.0–400.0)
RBC: 4.38 Mil/uL (ref 3.87–5.11)
RDW: 13.5 % (ref 11.5–15.5)
WBC: 14.1 10*3/uL — ABNORMAL HIGH (ref 4.0–10.5)

## 2015-02-04 LAB — LIPID PANEL
CHOLESTEROL: 144 mg/dL (ref 0–200)
HDL: 58.5 mg/dL (ref >39.00)
LDL Cholesterol: 67 mg/dL (ref 0–99)
NonHDL: 85.5
TRIGLYCERIDES: 95 mg/dL (ref 0.0–149.0)
Total CHOL/HDL Ratio: 2
VLDL: 19 mg/dL (ref 0.0–40.0)

## 2015-02-04 LAB — TSH: TSH: 5.24 u[IU]/mL — ABNORMAL HIGH (ref 0.35–4.50)

## 2015-02-04 LAB — BASIC METABOLIC PANEL
BUN: 12 mg/dL (ref 6–23)
CALCIUM: 9.5 mg/dL (ref 8.4–10.5)
CO2: 27 mEq/L (ref 19–32)
CREATININE: 0.59 mg/dL (ref 0.40–1.20)
Chloride: 101 mEq/L (ref 96–112)
GFR: 113.92 mL/min (ref >60.00)
Glucose, Bld: 81 mg/dL (ref 70–99)
Potassium: 4.1 mEq/L (ref 3.5–5.1)
Sodium: 135 mEq/L (ref 135–145)

## 2015-02-04 MED ORDER — DICLOFENAC POTASSIUM 50 MG PO TABS
50.0000 mg | ORAL_TABLET | Freq: Two times a day (BID) | ORAL | Status: DC
Start: 2015-02-04 — End: 2016-02-05

## 2015-02-04 MED ORDER — METHOCARBAMOL 500 MG PO TABS: 500.0000 mg | ORAL_TABLET | Freq: Four times a day (QID) | ORAL | Status: DC | PRN

## 2015-02-04 MED ORDER — PREDNISONE (PAK) 10 MG PO TABS: ORAL_TABLET | ORAL | Status: DC

## 2015-02-04 MED ORDER — ZOLPIDEM TARTRATE 10 MG PO TABS: 5.0000 mg | ORAL_TABLET | Freq: Every evening | ORAL | Status: DC | PRN

## 2015-02-04 NOTE — Patient Instructions (Addendum)
It was good to see you today.  We have reviewed your prior records including labs and tests today  Health Maintenance reviewed - flu immunization and Tdap (tetnus and whooping cough) are updated today -all other recommended immunizations and age-appropriate screenings are up-to-date.  Test(s) ordered today. Your results will be released to [facility] (or called to you) after review, usually within 72hours after test completion. If any changes need to be made, you will be notified at that same time.  Medications reviewed and updated  For your back and leg pain, take prednisone taper over next 6 days as directed and use Robaxin as muscle relaxer in place of Flexeril -   Your prescription(s) and refills have been submitted to your pharmacy. Please take as directed and contact our office if you believe you are having problem(s) with the medication(s).  Please schedule followup in 12 months for annual exam and labs, call sooner if problems.  Health Maintenance Adopting a healthy lifestyle and getting preventive care can go a long way to promote health and wellness. Talk with your health care provider about what schedule of regular examinations is right for you. This is a good chance for you to check in with your provider about disease prevention and staying healthy. In between checkups, there are plenty of things you can do on your own. Experts have done a lot of research about which lifestyle changes and preventive measures are most likely to keep you healthy. Ask your health care provider for more information. WEIGHT AND DIET  Eat a healthy diet  Be sure to include plenty of vegetables, fruits, low-fat dairy products, and lean protein.  Do not eat a lot of foods high in solid fats, added sugars, or salt.  Get regular exercise. This is one of the most important things you can do for your health.  Most adults should exercise for at least 150 minutes each week. The exercise should increase your  heart rate and make you sweat (moderate-intensity exercise).  Most adults should also do strengthening exercises at least twice a week. This is in addition to the moderate-intensity exercise.  Maintain a healthy weight  Body mass index (BMI) is a measurement that can be used to identify possible weight problems. It estimates body fat based on height and weight. Your health care provider can help determine your BMI and help you achieve or maintain a healthy weight.  For females 15 years of age and older:   A BMI below 18.5 is considered underweight.  A BMI of 18.5 to 24.9 is normal.  A BMI of 25 to 29.9 is considered overweight.  A BMI of 30 and above is considered obese.  Watch levels of cholesterol and blood lipids  You should start having your blood tested for lipids and cholesterol at 52 years of age, then have this test every 5 years.  You may need to have your cholesterol levels checked more often if:  Your lipid or cholesterol levels are high.  You are older than 52 years of age.  You are at high risk for heart disease.  CANCER SCREENING   Lung Cancer  Lung cancer screening is recommended for adults 82-48 years old who are at high risk for lung cancer because of a history of smoking.  A yearly low-dose CT scan of the lungs is recommended for people who:  Currently smoke.  Have quit within the past 15 years.  Have at least a 30-pack-year history of smoking. A pack year is  smoking an average of one pack of cigarettes a day for 1 year.  Yearly screening should continue until it has been 15 years since you quit.  Yearly screening should stop if you develop a health problem that would prevent you from having lung cancer treatment.  Breast Cancer  Practice breast self-awareness. This means understanding how your breasts normally appear and feel.  It also means doing regular breast self-exams. Let your health care provider know about any changes, no matter how  small.  If you are in your 20s or 30s, you should have a clinical breast exam (CBE) by a health care provider every 1-3 years as part of a regular health exam.  If you are 28 or older, have a CBE every year. Also consider having a breast X-ray (mammogram) every year.  If you have a family history of breast cancer, talk to your health care provider about genetic screening.  If you are at high risk for breast cancer, talk to your health care provider about having an MRI and a mammogram every year.  Breast cancer gene (BRCA) assessment is recommended for women who have family members with BRCA-related cancers. BRCA-related cancers include:  Breast.  Ovarian.  Tubal.  Peritoneal cancers.  Results of the assessment will determine the need for genetic counseling and BRCA1 and BRCA2 testing. Cervical Cancer Routine pelvic examinations to screen for cervical cancer are no longer recommended for nonpregnant women who are considered low risk for cancer of the pelvic organs (ovaries, uterus, and vagina) and who do not have symptoms. A pelvic examination may be necessary if you have symptoms including those associated with pelvic infections. Ask your health care provider if a screening pelvic exam is right for you.   The Pap test is the screening test for cervical cancer for women who are considered at risk.  If you had a hysterectomy for a problem that was not cancer or a condition that could lead to cancer, then you no longer need Pap tests.  If you are older than 65 years, and you have had normal Pap tests for the past 10 years, you no longer need to have Pap tests.  If you have had past treatment for cervical cancer or a condition that could lead to cancer, you need Pap tests and screening for cancer for at least 20 years after your treatment.  If you no longer get a Pap test, assess your risk factors if they change (such as having a new sexual partner). This can affect whether you should  start being screened again.  Some women have medical problems that increase their chance of getting cervical cancer. If this is the case for you, your health care provider may recommend more frequent screening and Pap tests.  The human papillomavirus (HPV) test is another test that may be used for cervical cancer screening. The HPV test looks for the virus that can cause cell changes in the cervix. The cells collected during the Pap test can be tested for HPV.  The HPV test can be used to screen women 13 years of age and older. Getting tested for HPV can extend the interval between normal Pap tests from three to five years.  An HPV test also should be used to screen women of any age who have unclear Pap test results.  After 52 years of age, women should have HPV testing as often as Pap tests.  Colorectal Cancer  This type of cancer can be detected and often prevented.  Routine colorectal cancer screening usually begins at 52 years of age and continues through 52 years of age.  Your health care provider may recommend screening at an earlier age if you have risk factors for colon cancer.  Your health care provider may also recommend using home test kits to check for hidden blood in the stool.  A small camera at the end of a tube can be used to examine your colon directly (sigmoidoscopy or colonoscopy). This is done to check for the earliest forms of colorectal cancer.  Routine screening usually begins at age 70.  Direct examination of the colon should be repeated every 5-10 years through 52 years of age. However, you may need to be screened more often if early forms of precancerous polyps or small growths are found. Skin Cancer  Check your skin from head to toe regularly.  Tell your health care provider about any new moles or changes in moles, especially if there is a change in a mole's shape or color.  Also tell your health care provider if you have a mole that is larger than the size  of a pencil eraser.  Always use sunscreen. Apply sunscreen liberally and repeatedly throughout the day.  Protect yourself by wearing long sleeves, pants, a wide-brimmed hat, and sunglasses whenever you are outside. HEART DISEASE, DIABETES, AND HIGH BLOOD PRESSURE   Have your blood pressure checked at least every 1-2 years. High blood pressure causes heart disease and increases the risk of stroke.  If you are between 48 years and 41 years old, ask your health care provider if you should take aspirin to prevent strokes.  Have regular diabetes screenings. This involves taking a blood sample to check your fasting blood sugar level.  If you are at a normal weight and have a low risk for diabetes, have this test once every three years after 51 years of age.  If you are overweight and have a high risk for diabetes, consider being tested at a younger age or more often. PREVENTING INFECTION  Hepatitis B  If you have a higher risk for hepatitis B, you should be screened for this virus. You are considered at high risk for hepatitis B if:  You were born in a country where hepatitis B is common. Ask your health care provider which countries are considered high risk.  Your parents were born in a high-risk country, and you have not been immunized against hepatitis B (hepatitis B vaccine).  You have HIV or AIDS.  You use needles to inject street drugs.  You live with someone who has hepatitis B.  You have had sex with someone who has hepatitis B.  You get hemodialysis treatment.  You take certain medicines for conditions, including cancer, organ transplantation, and autoimmune conditions. Hepatitis C  Blood testing is recommended for:  Everyone born from [birth year] through 1963.  Anyone with known risk factors for hepatitis C. Sexually transmitted infections (STIs)  You should be screened for sexually transmitted infections (STIs) including gonorrhea and chlamydia if:  You are sexually  active and are younger than 52 years of age.  You are older than 52 years of age and your health care provider tells you that you are at risk for this type of infection.  Your sexual activity has changed since you were last screened and you are at an increased risk for chlamydia or gonorrhea. Ask your health care provider if you are at risk.  If you do not have HIV, but are  at risk, it may be recommended that you take a prescription medicine daily to prevent HIV infection. This is called pre-exposure prophylaxis (PrEP). You are considered at risk if:  You are sexually active and do not regularly use condoms or know the HIV status of your partner(s).  You take drugs by injection.  You are sexually active with a partner who has HIV. Talk with your health care provider about whether you are at high risk of being infected with HIV. If you choose to begin PrEP, you should first be tested for HIV. You should then be tested every 3 months for as long as you are taking PrEP.  PREGNANCY   If you are premenopausal and you may become pregnant, ask your health care provider about preconception counseling.  If you may become pregnant, take 400 to 800 micrograms (mcg) of folic acid every day.  If you want to prevent pregnancy, talk to your health care provider about birth control (contraception). OSTEOPOROSIS AND MENOPAUSE   Osteoporosis is a disease in which the bones lose minerals and strength with aging. This can result in serious bone fractures. Your risk for osteoporosis can be identified using a bone density scan.  If you are 68 years of age or older, or if you are at risk for osteoporosis and fractures, ask your health care provider if you should be screened.  Ask your health care provider whether you should take a calcium or vitamin D supplement to lower your risk for osteoporosis.  Menopause may have certain physical symptoms and risks.  Hormone replacement therapy may reduce some of these  symptoms and risks. Talk to your health care provider about whether hormone replacement therapy is right for you.  HOME CARE INSTRUCTIONS   Schedule regular health, dental, and eye exams.  Stay current with your immunizations.   Do not use any tobacco products including cigarettes, chewing tobacco, or electronic cigarettes.  If you are pregnant, do not drink alcohol.  If you are breastfeeding, limit how much and how often you drink alcohol.  Limit alcohol intake to no more than 1 drink per day for nonpregnant women. One drink equals 12 ounces of beer, 5 ounces of wine, or 1 ounces of hard liquor.  Do not use street drugs.  Do not share needles.  Ask your health care provider for help if you need support or information about quitting drugs.  Tell your health care provider if you often feel depressed.  Tell your health care provider if you have ever been abused or do not feel safe at home. Document Released: 06/28/2011 Document Revised: 04/29/2014 Document Reviewed: 11/14/2013 [facility] Patient Information 2015 [place], [place]. This information is not intended to replace advice given to you by your health care provider. Make sure you discuss any questions you have with your health care provider. Back Exercises Back exercises help treat and prevent back injuries. The goal is to increase your strength in your belly (abdominal) and back muscles. These exercises can also help with flexibility. Start these exercises when told by your doctor. HOME CARE Back exercises include: Pelvic Tilt.  Lie on your back with your knees bent. Tilt your pelvis until the lower part of your back is against the floor. Hold this position 5 to 10 sec. Repeat this exercise 5 to 10 times. Knee to Chest.  Pull 1 knee up against your chest and hold for 20 to 30 seconds. Repeat this with the other knee. This may be done with the other leg  straight or bent, whichever feels better. Then, pull both knees up against  your chest. Sit-Ups or Curl-Ups.  Bend your knees 90 degrees. Start with tilting your pelvis, and do a partial, slow sit-up. Only lift your upper half 30 to 45 degrees off the floor. Take at least 2 to 3 seonds for each sit-up. Do not do sit-ups with your knees out straight. If partial sit-ups are difficult, simply do the above but with only tightening your belly (abdominal) muscles and holding it as told. Hip-Lift.  Lie on your back with your knees flexed 90 degrees. Push down with your feet and shoulders as you raise your hips 2 inches off the floor. Hold for 10 seconds, repeat 5 to 10 times. Back Arches.  Lie on your stomach. Prop yourself up on bent elbows. Slowly press on your hands, causing an arch in your low back. Repeat 3 to 5 times. Shoulder-Lifts.  Lie face down with arms beside your body. Keep hips and belly pressed to floor as you slowly lift your head and shoulders off the floor. Do not overdo your exercises. Be careful in the beginning. Exercises may cause you some mild back discomfort. If the pain lasts for more than 15 minutes, stop the exercises until you see your doctor. Improvement with exercise for back problems is slow.  Document Released: 01/15/2011 Document Revised: 03/06/2012 Document Reviewed: 10/14/2011 [facility] Patient Information 2015 [place], [place]. This information is not intended to replace advice given to you by your health care provider. Make sure you discuss any questions you have with your health care provider.

## 2015-02-04 NOTE — Progress Notes (Signed)
Subjective:    Patient ID: Maria Barker, female    DOB: [birth year], 54 y.o.   MRN: [id number]  HPI  patient is here today for annual physical. Patient feels well and has no complaints.  Also complains of 5 days R back and leg pain - not improved with flexeril. Worse at night - requests refill on NSAID   Past Medical History  Diagnosis Date  . Arthritis     R THR 2011  . Hyperlipidemia LDL goal < 130   . Pain of left thigh   . Back pain   . Left hip pain   . Insomnia   . Frequent headaches    Family History  Problem Relation Age of Onset  . CVA Mother   . Hypertension Mother    History  Substance Use Topics  . Smoking status: Never Smoker   . Smokeless tobacco: Never Used  . Alcohol Use: No    Review of Systems  Constitutional: Negative for fever, fatigue and unexpected weight change.  Respiratory: Negative for cough, shortness of breath and wheezing.   Cardiovascular: Negative for chest pain, palpitations and leg swelling.  Gastrointestinal: Negative for nausea, abdominal pain and diarrhea.  Genitourinary: Negative for dysuria, frequency and flank pain.  Musculoskeletal: Positive for back pain (R side x 5 days. radiating into R buttock, thigh and lower leg -worse lying down). Negative for gait problem.  Neurological: Negative for dizziness, tremors, syncope, weakness, light-headedness and headaches.  Psychiatric/Behavioral: Negative for dysphoric mood. The patient is not nervous/anxious.   All other systems reviewed and are negative.      Objective:    Physical Exam  Constitutional: She is oriented to person, place, and time. She appears well-developed and well-nourished. No distress.  HENT:  Head: Normocephalic and atraumatic.  Right Ear: External ear normal.  Left Ear: External ear normal.  Nose: Nose normal.  Mouth/Throat: Oropharynx is clear and moist. No oropharyngeal exudate.  Eyes: EOM are normal. Pupils are equal, round, and reactive to light. Right  eye exhibits no discharge. Left eye exhibits no discharge. No scleral icterus.  Neck: Normal range of motion. Neck supple. No JVD present. No tracheal deviation present. No thyromegaly present.  Cardiovascular: Normal rate, regular rhythm, normal heart sounds and intact distal pulses.  Exam reveals no friction rub.   No murmur heard. Pulmonary/Chest: Effort normal and breath sounds normal. No respiratory distress. She has no wheezes. She has no rales. She exhibits no tenderness.  Abdominal: Soft. Bowel sounds are normal. She exhibits no distension and no mass. There is no tenderness. There is no rebound and no guarding.  Genitourinary:  Defer to gyn  Musculoskeletal: Normal range of motion.  No gross deformities Back: full range of motion of thoracic and lumbar spine. Non tender to palpation. Negative straight leg raise. DTR's are symmetrically intact. Sensation intact in all dermatomes of the lower extremities. Full strength to manual muscle testing. patient is able to heel toe walk without difficulty and ambulates with antalgic gait.   Lymphadenopathy:    She has no cervical adenopathy.  Neurological: She is alert and oriented to person, place, and time. She has normal reflexes. No cranial nerve deficit.  Skin: Skin is warm and dry. No rash noted. She is not diaphoretic. No erythema.  Psychiatric: She has a normal mood and affect. Her behavior is normal. Judgment and thought content normal.  Nursing note and vitals reviewed.   BP 130/82 mmHg  Pulse 77  Temp(Src) 97.7 F (36.5  C) (Oral)  Ht 4\' 11"  (1.499 m)  Wt 118 lb 12 oz (53.865 kg)  BMI 23.97 kg/m2  SpO2 95% Wt Readings from Last 3 Encounters:  02/04/15 118 lb 12 oz (53.865 kg)  10/08/14 117 lb 2 oz (53.128 kg)  04/17/14 116 lb 12.8 oz (52.98 kg)     Lab Results  Component Value Date   WBC 8.9 12/21/2013   HGB 12.0 12/21/2013   HCT 36.6 12/21/2013   PLT 347.0 12/21/2013   GLUCOSE 82 12/21/2013   CHOL 161 12/21/2013    TRIG 214.0* 12/21/2013   HDL 49.10 12/21/2013   LDLDIRECT 93.7 12/21/2013   LDLCALC 154* 08/11/2010   ALT 28 12/21/2013   AST 30 12/21/2013   NA 136 12/21/2013   K 3.8 12/21/2013   CL 105 12/21/2013   CREATININE 0.5 12/21/2013   BUN 14 12/21/2013   CO2 26 12/21/2013   TSH 4.63 12/21/2013   INR 1.59* 04/02/2010    No results found.     Assessment & Plan:   CPX/z00.00 - Patient has been counseled on age-appropriate routine health concerns for screening and prevention. These are reviewed and up-to-date. Immunizations are up-to-date or declined. Labs and ECG reviewed.  R sciatica/back pain - hx same - no neuro deficits - tx with pred taper and change muscle relaxer  Back exercises provided - to call if unimproved in next 5-7 days, sooner if worse  Problem List Items Addressed This Visit    None    Visit Diagnoses    Routine general medical examination at a health care facility    -  Primary    Relevant Orders    Basic metabolic panel    CBC with Differential/Platelet    Hepatic function panel    Lipid panel    TSH    Urinalysis, Routine w reflex microscopic    Right-sided low back pain with right-sided sciatica        Relevant Medications    diclofenac (CATAFLAM) tablet 50 mg    methocarbamol (ROBAXIN) tablet    predniSONE (STERAPRED UNI-PAK) 10 MG tablet        Gwendolyn Grant, MD

## 2015-02-04 NOTE — Progress Notes (Signed)
Pre visit review using our clinic review tool, if applicable. No additional management support is needed unless otherwise documented below in the visit note. 

## 2015-02-06 ENCOUNTER — Encounter: Payer: Self-pay | Admitting: Internal Medicine

## 2015-02-06 DIAGNOSIS — R7989 Other specified abnormal findings of blood chemistry: Secondary | ICD-10-CM | POA: Insufficient documentation

## 2015-02-06 DIAGNOSIS — E039 Hypothyroidism, unspecified: Secondary | ICD-10-CM

## 2015-02-06 DIAGNOSIS — R945 Abnormal results of liver function studies: Secondary | ICD-10-CM | POA: Insufficient documentation

## 2015-02-06 HISTORY — DX: Hypothyroidism, unspecified: E03.9

## 2015-02-07 ENCOUNTER — Other Ambulatory Visit: Payer: Self-pay

## 2015-02-07 DIAGNOSIS — R7989 Other specified abnormal findings of blood chemistry: Secondary | ICD-10-CM

## 2015-02-07 DIAGNOSIS — E039 Hypothyroidism, unspecified: Secondary | ICD-10-CM

## 2015-02-07 DIAGNOSIS — R945 Abnormal results of liver function studies: Secondary | ICD-10-CM

## 2015-02-07 MED ORDER — CIPROFLOXACIN HCL 500 MG PO TABS: 500.0000 mg | ORAL_TABLET | Freq: Two times a day (BID) | ORAL | Status: DC

## 2015-02-07 MED ORDER — LEVOTHYROXINE SODIUM 50 MCG PO TABS: 50.0000 ug | ORAL_TABLET | Freq: Every day | ORAL | Status: DC

## 2015-02-11 ENCOUNTER — Telehealth: Payer: Self-pay | Admitting: Internal Medicine

## 2015-02-11 NOTE — Telephone Encounter (Signed)
Patient Name: Maria Barker DOB: 1961-12-17 Initial Comment caller states the medication that the dr gave her helps her hip pain, but she has a rash on her back now Nurse Assessment Nurse: Marcelline Deist, RN, Kermit Balo Date/Time (Eastern Time): 02/11/2015 10:43:31 AM Confirm and document reason for call. If symptomatic, describe symptoms. ---Caller states the medication - Prednisone that the Dr. gave her helps her hip pain, but she has a rash on her back down to buttocks - raised, red & itchy. Finished the rx last night. The rash started Sat. Also on a new thyroid rx - Levothyroxine, started a few days ago Sunday). No fever. Has the patient traveled out of the country within the last 30 days? ---Not Applicable Does the patient require triage? ---Yes Related visit to physician within the last 2 weeks? ---Yes Does the PT have any chronic conditions? (i.e. diabetes, asthma, etc.) ---Yes List chronic conditions. 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 ---No Guidelines Guideline Title Affirmed Question Affirmed Notes Rash - Widespread On Drugs Hives or itching Final Disposition User See Physician within [facility], Therapist, sports, [place]

## 2015-02-11 NOTE — Telephone Encounter (Signed)
Pred was rx'd for lumbar radiculopathy, not rash. There was no rash at OV - ?rxn to Tdap rather than thyroid rx? If not improved with Benadryl 25mg  q4h prn, I suggest OV - here as acute or with Ambia thanks

## 2015-02-11 NOTE — Telephone Encounter (Signed)
Prednisone rx completed now. Rash started on Saturday. New Thyroid rx on Sunday.   rash on her back down to buttocks - raised, red & itchy.  The rash started Sat. No fever.  More notes below.

## 2015-02-12 ENCOUNTER — Telehealth: Payer: Self-pay | Admitting: *Deleted

## 2015-02-12 ENCOUNTER — Ambulatory Visit (INDEPENDENT_AMBULATORY_CARE_PROVIDER_SITE_OTHER): Payer: 59 | Admitting: Internal Medicine

## 2015-02-12 ENCOUNTER — Encounter: Payer: Self-pay | Admitting: Internal Medicine

## 2015-02-12 VITALS — BP 140/80 | HR 69 | Temp 97.8°F | Ht 59.0 in | Wt 120.1 lb

## 2015-02-12 DIAGNOSIS — B354 Tinea corporis: Secondary | ICD-10-CM

## 2015-02-12 MED ORDER — KETOCONAZOLE 2 % EX CREA: 1.0000 "application " | TOPICAL_CREAM | Freq: Every day | CUTANEOUS | Status: DC

## 2015-02-12 NOTE — Telephone Encounter (Signed)
LVM for pt to call back.   RE: MD advise below needs to be given to the pt.

## 2015-02-12 NOTE — Progress Notes (Signed)
   Subjective:    Patient ID: Maria Barker, female    DOB: Jan 10, 1961, 54 y.o.   MRN: [id number]  HPI  Her symptoms began 02/08/15 as a rash over the right lower back/flank. It was associated with itching but no burning, pustules, or vesicles. Aveeno lotion applied topically has been of some benefit.   She had started prednisone for hip and leg pain as well as ciprofloxacin and levofloxacin on 2/11. The pain has improved.  She does work at a  [employer]. She has no known occupational exposures at the site  There has been no change in her detergent, fabric softener, soaps, lotions. She has a Programmer, systems which is well. She's not exposed to small children.  .  She has no constitutional or extrinsic symptoms.  Review of Systems   No associated itchy, watery eyes.  Swelling of the lips or tongue denied.  Shortness of breath, wheezing, or cough absent.  Fever ,chills , or sweats denied. Purulence absent.  Diarrhea not present.     Objective:   Physical Exam  Pertinent positive findings include: There is a slightly raised, slightly erythematous irregular dermatitis  10.5 X 30 cm with an elevated serpiginous border  over the right posterior flank. She does have slight dermatographia.  General appearance :adequately nourished; in no distress. Eyes: No conjunctival inflammation or scleral icterus is present. Oral exam: Dental hygiene is good. Lips and gums are healthy appearing.There is no oropharyngeal erythema or exudate noted.  Heart:  Normal rate and regular rhythm. S1 and S2 normal without gallop, murmur, click, rub or other extra sounds   Lungs:Chest clear to auscultation; no wheezes, rhonchi,rales ,or rubs present.No increased work of breathing.  Abdomen: bowel sounds normal, soft and non-tender without masses, organomegaly or hernias noted.  No guarding or rebound.  Vascular : all pulses equal ; no bruits present. Skin:Warm & dry.  Intact  without suspicious lesions or rashes ; no jaundice or tenting Lymphatic: No lymphadenopathy is noted about the head, neck, axilla Neuro: Strength, tone  normal.         Assessment & Plan:  #1 dermatitis; rule out tinea coporis  Plan: See orders

## 2015-02-12 NOTE — Progress Notes (Signed)
Pre visit review using our clinic review tool, if applicable. No additional management support is needed unless otherwise documented below in the visit note. 

## 2015-02-12 NOTE — Patient Instructions (Signed)
Use a hairdryer on low heat to blow the cream applied once daily

## 2015-02-12 NOTE — Telephone Encounter (Signed)
[facility] - [facility] Patient Name: Maria Barker Gender: Female DOB: February 16, 1961 Age: 54 Y 90 M 1 D Return Phone Number: [phone] (Primary) Address: [address] City/State/Zip: [place] [employer] [facility] - Client Client Site [facility] - Day Physician Gwendolyn Grant Contact Type Call Call Type Triage / Clinical Relationship To Patient Self Appointment Disposition EMR Appointment Scheduled Return Phone Number [phone] (Primary) Chief Complaint Rash - Localized Initial Comment caller states the medication that the dr gave her helps her hip pain, but she has a rash on her back now PreDisposition Call Doctor Info pasted into Epic Yes Nurse Assessment Nurse: Marcelline Deist, RN, Kermit Balo Date/Time (Eastern Time): 02/11/2015 10:43:31 AM Confirm and document reason for call. If symptomatic, describe symptoms. ---Caller states the medication - Prednisone that the Dr. gave her helps her hip pain, but she has a rash on her back down to buttocks - raised, red & itchy. Finished the rx last night. The rash started Sat. Also on a new thyroid rx - Levothyroxine, started a few days ago. No fever. Has the patient traveled out of the country within the last 30 days? ---Not Applicable Does the patient require triage? ---Yes Related visit to physician within the last 2 weeks? ---Yes Does the PT have any chronic conditions? (i.e. diabetes, asthma, etc.) ---Yes List chronic conditions. 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 ---No Guidelines Guideline Title Affirmed Question Affirmed Notes Nurse Date/Time (Eastern Time) Rash - Widespread On Drugs Hives or itching Marcelline Deist, RN, Kermit Balo 02/11/2015 10:50:41 AM Disp. Time Eilene Ghazi Time) Disposition Final User 02/11/2015 10:57:05 AM See Physician within 24 Hours Yes Marcelline Deist, RN, Kermit Balo PLEASE NOTE: All  timestamps contained within this report are represented as [place] Standard Time. CONFIDENTIALTY NOTICE: This fax transmission is intended only for the addressee. It contains information that is legally privileged, confidential or otherwise protected from use or disclosure. If you are not the intended recipient, you are strictly prohibited from reviewing, disclosing, copying using or disseminating any of this information or taking any action in reliance on or regarding this information. If you have received this fax in error, please notify us immediately by telephone so that we can arrange for its return to [place]. Phone: [phone], Toll-Free: [phone], Fax: [phone] Page: 2 of 2 Call Id: [id number] Caller Understands: Yes Disagree/Comply: Comply Care Advice Given Per Guideline SEE PHYSICIAN WITHIN 24 HOURS: * IF OFFICE WILL BE OPEN: You need to be examined within the next 24 hours. Call your doctor when the office opens, and make an appointment. STOP THE MEDICATION: If the patient has severe hives or itching, suspect a drug allergy and stop the medicine until examined. ANTIHISTAMINE FOR HIVES OR SEVERE ITCHING: CALL BACK IF: * You become worse. CARE ADVICE given per Rash - Widespread on Drugs (Adult) guideline * Take an antihistamine by mouth. Diphenhydramine (OTC Benadryl) is a good choice. Adult dose is 25-50 mg. Take it up to 4 times a day. * Other OTC antihistamine: loratadine (Alavert, Claritin). After Care Instructions Given Call Event Type User Date / Time Description

## 2015-02-18 ENCOUNTER — Other Ambulatory Visit: Payer: Self-pay | Admitting: Obstetrics and Gynecology

## 2015-02-19 LAB — CYTOLOGY - PAP

## 2015-08-12 ENCOUNTER — Other Ambulatory Visit (INDEPENDENT_AMBULATORY_CARE_PROVIDER_SITE_OTHER): Payer: 59

## 2015-08-12 ENCOUNTER — Ambulatory Visit (INDEPENDENT_AMBULATORY_CARE_PROVIDER_SITE_OTHER): Payer: 59 | Admitting: Internal Medicine

## 2015-08-12 ENCOUNTER — Encounter: Payer: Self-pay | Admitting: Internal Medicine

## 2015-08-12 VITALS — BP 128/78 | HR 68 | Temp 97.5°F | Resp 16 | Ht 59.0 in | Wt 121.0 lb

## 2015-08-12 DIAGNOSIS — E038 Other specified hypothyroidism: Secondary | ICD-10-CM

## 2015-08-12 DIAGNOSIS — R7989 Other specified abnormal findings of blood chemistry: Secondary | ICD-10-CM

## 2015-08-12 DIAGNOSIS — K219 Gastro-esophageal reflux disease without esophagitis: Secondary | ICD-10-CM

## 2015-08-12 DIAGNOSIS — E039 Hypothyroidism, unspecified: Secondary | ICD-10-CM | POA: Diagnosis not present

## 2015-08-12 DIAGNOSIS — D510 Vitamin B12 deficiency anemia due to intrinsic factor deficiency: Secondary | ICD-10-CM | POA: Diagnosis not present

## 2015-08-12 DIAGNOSIS — Z8249 Family history of ischemic heart disease and other diseases of the circulatory system: Secondary | ICD-10-CM

## 2015-08-12 DIAGNOSIS — R51 Headache: Secondary | ICD-10-CM

## 2015-08-12 DIAGNOSIS — R945 Abnormal results of liver function studies: Secondary | ICD-10-CM

## 2015-08-12 DIAGNOSIS — R519 Headache, unspecified: Secondary | ICD-10-CM

## 2015-08-12 LAB — CBC WITH DIFFERENTIAL/PLATELET
BASOS PCT: 0.6 % (ref 0.0–3.0)
Basophils Absolute: 0 10*3/uL (ref 0.0–0.1)
EOS ABS: 2 10*3/uL — AB (ref 0.0–0.7)
HEMATOCRIT: 35.9 % — AB (ref 36.0–46.0)
HEMOGLOBIN: 12 g/dL (ref 12.0–15.0)
LYMPHS PCT: 19.5 % (ref 12.0–46.0)
Lymphs Abs: 1.7 10*3/uL (ref 0.7–4.0)
MCHC: 33.3 g/dL (ref 30.0–36.0)
MCV: 80.6 fl (ref 78.0–100.0)
MONOS PCT: 6.9 % (ref 3.0–12.0)
Monocytes Absolute: 0.6 10*3/uL (ref 0.1–1.0)
NEUTROS ABS: 4.4 10*3/uL (ref 1.4–7.7)
Neutrophils Relative %: 50.6 % (ref 43.0–77.0)
PLATELETS: 324 10*3/uL (ref 150.0–400.0)
RBC: 4.46 Mil/uL (ref 3.87–5.11)
RDW: 14.5 % (ref 11.5–15.5)
WBC: 8.7 10*3/uL (ref 4.0–10.5)

## 2015-08-12 LAB — COMPREHENSIVE METABOLIC PANEL
ALK PHOS: 83 U/L (ref 39–117)
ALT: 23 U/L (ref 0–35)
AST: 26 U/L (ref 0–37)
Albumin: 4.1 g/dL (ref 3.5–5.2)
BUN: 15 mg/dL (ref 6–23)
CO2: 29 meq/L (ref 19–32)
Calcium: 9.3 mg/dL (ref 8.4–10.5)
Chloride: 103 mEq/L (ref 96–112)
Creatinine, Ser: 0.61 mg/dL (ref 0.40–1.20)
GFR: 109.4 mL/min (ref >60.00)
GLUCOSE: 79 mg/dL (ref 70–99)
POTASSIUM: 4.2 meq/L (ref 3.5–5.1)
Sodium: 136 mEq/L (ref 135–145)
TOTAL PROTEIN: 8 g/dL (ref 6.0–8.3)
Total Bilirubin: 0.4 mg/dL (ref 0.2–1.2)

## 2015-08-12 LAB — HEPATIC FUNCTION PANEL
ALBUMIN: 4.1 g/dL (ref 3.5–5.2)
ALT: 23 U/L (ref 0–35)
AST: 26 U/L (ref 0–37)
Alkaline Phosphatase: 83 U/L (ref 39–117)
BILIRUBIN DIRECT: 0.1 mg/dL (ref 0.0–0.3)
TOTAL PROTEIN: 8 g/dL (ref 6.0–8.3)
Total Bilirubin: 0.4 mg/dL (ref 0.2–1.2)

## 2015-08-12 LAB — VITAMIN B12: Vitamin B-12: 1342 pg/mL — ABNORMAL HIGH (ref 211–911)

## 2015-08-12 LAB — FERRITIN: Ferritin: 50.8 ng/mL (ref 10.0–291.0)

## 2015-08-12 LAB — IBC PANEL
IRON: 78 ug/dL (ref 42–145)
Saturation Ratios: 21.8 % (ref 20.0–50.0)
TRANSFERRIN: 256 mg/dL (ref 212.0–360.0)

## 2015-08-12 LAB — TSH: TSH: 0.94 u[IU]/mL (ref 0.35–4.50)

## 2015-08-12 LAB — PROTIME-INR
INR: 1.1 ratio — ABNORMAL HIGH (ref 0.8–1.0)
Prothrombin Time: 12.6 s (ref 9.6–13.1)

## 2015-08-12 LAB — FOLATE

## 2015-08-12 MED ORDER — ELETRIPTAN HYDROBROMIDE 20 MG PO TABS: 20.0000 mg | ORAL_TABLET | ORAL | Status: DC | PRN

## 2015-08-12 MED ORDER — RANITIDINE HCL 150 MG PO TABS: 150.0000 mg | ORAL_TABLET | Freq: Two times a day (BID) | ORAL | Status: DC

## 2015-08-12 NOTE — Progress Notes (Signed)
Pre visit review using our clinic review tool, if applicable. No additional management support is needed unless otherwise documented below in the visit note. 

## 2015-08-12 NOTE — Progress Notes (Signed)
Subjective:  Patient ID: Maria Barker, female    DOB: 19-Sep-1961  Age: 54 y.o. MRN: [id number]  CC: Hypothyroidism and Headache   HPI Lajoya Dombek presents for evaluation of headache. She has had a severe, bilateral headache for the last 2-3 weeks. She describes it as a sensation around her eyes and temples that her head is going to explode. She complains of nausea but no vomiting. She tells me that her mother had a massive stroke in middle-age and thinks there was an aneurysm in her brain. She also needs a recheck on her thyroid level as well as her liver enzymes. She struggles with the [place] language but her boyfriend helps her communicate her symptoms today. She has tried Excedrin with some relief from her symptoms.  Outpatient Prescriptions Prior to Visit  Medication Sig Dispense Refill  . acetaminophen (TYLENOL) 650 MG CR tablet Take 650 mg by mouth every 8 (eight) hours as needed for pain.    Marland Kitchen atorvastatin (LIPITOR) 10 MG tablet Take 1 tablet (10 mg total) by mouth daily. 90 tablet 3  . Calcium Carbonate-Vitamin D (CALCIUM-VITAMIN D3 PO) Take 1 each by mouth 2 (two) times daily.    . diclofenac (CATAFLAM) 50 MG tablet Take 1 tablet (50 mg total) by mouth 2 (two) times daily. 60 tablet 5  . levothyroxine (SYNTHROID, LEVOTHROID) 50 MCG tablet Take 1 tablet (50 mcg total) by mouth daily before breakfast. 30 tablet 5  . ciprofloxacin (CIPRO) 500 MG tablet Take 1 tablet (500 mg total) by mouth 2 (two) times daily. 20 tablet 0  . cyclobenzaprine (FLEXERIL) 5 MG tablet Take 1 tablet (5 mg total) by mouth 2 (two) times daily as needed for muscle spasms. 30 tablet 1  . ketoconazole (NIZORAL) 2 % cream Apply 1 application topically daily. 30 g 0  . methocarbamol (ROBAXIN) 500 MG tablet Take 1 tablet (500 mg total) by mouth every 6 (six) hours as needed for muscle spasms. 40 tablet 1  . ranitidine (ZANTAC) 150 MG tablet Take 1 tablet (150 mg total) by mouth 2 (two) times daily. 60 tablet 1  .  traMADol (ULTRAM) 50 MG tablet Take 1 tablet (50 mg total) by mouth every 6 (six) hours as needed. 30 tablet 0  . zolpidem (AMBIEN) 10 MG tablet Take 0.5-1 tablets (5-10 mg total) by mouth at bedtime as needed for sleep. 30 tablet 5   No facility-administered medications prior to visit.    ROS Review of Systems  Constitutional: Negative.  Negative for fever, chills, diaphoresis, appetite change and fatigue.  HENT: Negative for trouble swallowing and voice change.   Eyes: Negative.  Negative for photophobia, redness and visual disturbance.  Respiratory: Negative.  Negative for cough, choking, chest tightness, shortness of breath and stridor.   Cardiovascular: Negative.  Negative for chest pain, palpitations and leg swelling.  Gastrointestinal: Positive for nausea. Negative for vomiting, abdominal pain, diarrhea, constipation and anal bleeding.  Endocrine: Negative.   Genitourinary: Negative.   Musculoskeletal: Negative.   Skin: Negative.  Negative for rash.  Allergic/Immunologic: Negative.   Neurological: Positive for headaches. Negative for dizziness, tremors, seizures, syncope, facial asymmetry, speech difficulty, weakness, light-headedness and numbness.  Hematological: Negative.  Negative for adenopathy. Does not bruise/bleed easily.  Psychiatric/Behavioral: Negative.     Objective:  BP 128/78 mmHg  Pulse 68  Temp(Src) 97.5 F (36.4 C) (Oral)  Resp 16  Ht 4\' 11"  (1.499 m)  Wt 121 lb (54.885 kg)  BMI 24.43 kg/m2  SpO2 97%  BP Readings from Last 3 Encounters:  08/12/15 128/78  02/12/15 140/80  02/04/15 130/82    Wt Readings from Last 3 Encounters:  08/12/15 121 lb (54.885 kg)  02/12/15 120 lb 2 oz (54.488 kg)  02/04/15 118 lb 12 oz (53.865 kg)    Physical Exam  Constitutional: She is oriented to person, place, and time. She appears well-developed and well-nourished. No distress.  HENT:  Head: Normocephalic and atraumatic.  Mouth/Throat: Oropharynx is clear and  moist. No oropharyngeal exudate.  Eyes: Conjunctivae and EOM are normal. Pupils are equal, round, and reactive to light. Right eye exhibits no discharge. Left eye exhibits no discharge. No scleral icterus.  Neck: Normal range of motion. Neck supple. No JVD present. No tracheal deviation present. No thyromegaly present.  Cardiovascular: Normal rate, regular rhythm, normal heart sounds and intact distal pulses.  Exam reveals no gallop and no friction rub.   No murmur heard. Pulmonary/Chest: Effort normal and breath sounds normal. No stridor. No respiratory distress. She has no wheezes. She has no rales. She exhibits no tenderness.  Abdominal: Soft. Bowel sounds are normal. She exhibits no distension and no mass. There is no tenderness. There is no rebound and no guarding.  Lymphadenopathy:    She has no cervical adenopathy.  Neurological: She is alert and oriented to person, place, and time. She has normal strength. She displays no atrophy, no tremor and normal reflexes. No cranial nerve deficit or sensory deficit. She exhibits normal muscle tone. She displays a negative Romberg sign. She displays no seizure activity. Coordination and gait normal. She displays no Babinski's sign on the right side. She displays no Babinski's sign on the left side.  Reflex Scores:      Tricep reflexes are 1+ on the right side and 1+ on the left side.      Bicep reflexes are 1+ on the right side and 1+ on the left side.      Brachioradialis reflexes are 0 on the right side and 0 on the left side.      Patellar reflexes are 1+ on the right side and 1+ on the left side.      Achilles reflexes are 1+ on the right side and 1+ on the left side. Skin: Skin is warm and dry. No rash noted. She is not diaphoretic. No erythema. No pallor.  Psychiatric: She has a normal mood and affect. Her behavior is normal. Judgment and thought content normal.  Vitals reviewed.   Lab Results  Component Value Date   WBC 8.7 08/12/2015    HGB 12.0 08/12/2015   HCT 35.9* 08/12/2015   PLT 324.0 08/12/2015   GLUCOSE 79 08/12/2015   CHOL 144 02/04/2015   TRIG 95.0 02/04/2015   HDL 58.50 02/04/2015   LDLDIRECT 93.7 12/21/2013   LDLCALC 67 02/04/2015   ALT 23 08/12/2015   ALT 23 08/12/2015   AST 26 08/12/2015   AST 26 08/12/2015   NA 136 08/12/2015   K 4.2 08/12/2015   CL 103 08/12/2015   CREATININE 0.61 08/12/2015   BUN 15 08/12/2015   CO2 29 08/12/2015   TSH 0.94 08/12/2015   INR 1.1* 08/12/2015    No results found.  Assessment & Plan:   Danyela was seen today for hypothyroidism and headache.  Diagnoses and all orders for this visit:  Other specified hypothyroidism- her TSH is in the normal range, she will continue the current dose of Synthroid. -     TSH; Future  Pernicious anemia-  her hemoglobin and hematocrit have normalized now, all of her vitamin levels are normal, will follow. -     CBC with Differential/Platelet; Future -     IBC panel; Future -     Vitamin B12; Future -     Folate; Future -     Ferritin; Future  Abnormal LFTs- her LFTs are normal now, she previously had a hepatitis viral profile done and was only positive for hepatitis A surface antibody, she does not want to be vaccinated against hepatitis B. I had considered doing an ultrasound of her liver but since her LFTs are normal will hold on that. -     Comprehensive metabolic panel; Future -     Protime-INR; Future -     Cancel: US Abdomen Complete; Future  Intractable episodic headache, unspecified headache type- I will get an MRI-MRA done of her brain to screen for aneurysms, lesions, tumors, CVA. For now will treat her with for migraine with Relpax. -     MR MRA HEAD W WO CONTRAST; Future -     MR Brain Wo Contrast; Future -     eletriptan (RELPAX) 20 MG tablet; Take 1 tablet (20 mg total) by mouth as needed for migraine or headache. May repeat in 2 hours if headache persists or recurs.  Family history of brain aneurysm -     MR MRA  HEAD W WO CONTRAST; Future -     MR Brain Wo Contrast; Future  Gastroesophageal reflux disease without esophagitis -     ranitidine (ZANTAC) 150 MG tablet; Take 1 tablet (150 mg total) by mouth 2 (two) times daily.   I have discontinued Ms. Madl's cyclobenzaprine, traMADol, zolpidem, methocarbamol, ciprofloxacin, and ketoconazole. I am also having her start on eletriptan. Additionally, I am having her maintain her acetaminophen, Calcium Carbonate-Vitamin D (CALCIUM-VITAMIN D3 PO), atorvastatin, diclofenac, levothyroxine, and ranitidine.  Meds ordered this encounter  Medications  . ranitidine (ZANTAC) 150 MG tablet    Sig: Take 1 tablet (150 mg total) by mouth 2 (two) times daily.    Dispense:  60 tablet    Refill:  11  . eletriptan (RELPAX) 20 MG tablet    Sig: Take 1 tablet (20 mg total) by mouth as needed for migraine or headache. May repeat in 2 hours if headache persists or recurs.    Dispense:  10 tablet    Refill:  3     Follow-up: Return in about 3 weeks (around 09/02/2015).  Scarlette Calico, MD

## 2015-08-12 NOTE — Patient Instructions (Signed)

## 2015-08-14 ENCOUNTER — Encounter: Payer: Self-pay | Admitting: Internal Medicine

## 2015-08-23 ENCOUNTER — Other Ambulatory Visit: Payer: 59

## 2015-08-23 ENCOUNTER — Inpatient Hospital Stay: Admission: RE | Admit: 2015-08-23 | Payer: 59 | Source: Ambulatory Visit

## 2015-08-26 ENCOUNTER — Telehealth: Payer: Self-pay | Admitting: *Deleted

## 2015-08-26 MED ORDER — LEVOTHYROXINE SODIUM 50 MCG PO TABS: 50.0000 ug | ORAL_TABLET | Freq: Every day | ORAL | Status: DC

## 2015-08-26 NOTE — Telephone Encounter (Signed)
Left msg on triage needing medication refill. Called pt back no answer LMOM RTC with name of med she is needing...Maria Barker

## 2015-08-26 NOTE — Telephone Encounter (Signed)
Called pt again pt states she is needing refill on her thyroid medication. Verified pharmacy inform pt will send to [facility]...Johny Chess

## 2015-09-03 ENCOUNTER — Ambulatory Visit: Payer: 59 | Admitting: Internal Medicine

## 2015-10-24 ENCOUNTER — Other Ambulatory Visit: Payer: Self-pay | Admitting: Internal Medicine

## 2015-11-03 ENCOUNTER — Encounter: Payer: Self-pay | Admitting: *Deleted

## 2015-11-03 ENCOUNTER — Ambulatory Visit (INDEPENDENT_AMBULATORY_CARE_PROVIDER_SITE_OTHER): Payer: Managed Care, Other (non HMO) | Admitting: Family Medicine

## 2015-11-03 ENCOUNTER — Other Ambulatory Visit: Payer: Self-pay | Admitting: Family Medicine

## 2015-11-03 ENCOUNTER — Encounter: Payer: Self-pay | Admitting: Family Medicine

## 2015-11-03 ENCOUNTER — Ambulatory Visit (INDEPENDENT_AMBULATORY_CARE_PROVIDER_SITE_OTHER)
Admission: RE | Admit: 2015-11-03 | Discharge: 2015-11-03 | Disposition: A | Discharge status: Home / Self Care | Payer: Managed Care, Other (non HMO) | Source: Ambulatory Visit | Attending: Family Medicine | Admitting: Family Medicine

## 2015-11-03 ENCOUNTER — Other Ambulatory Visit (INDEPENDENT_AMBULATORY_CARE_PROVIDER_SITE_OTHER): Payer: Managed Care, Other (non HMO)

## 2015-11-03 VITALS — BP 112/64 | HR 71 | Ht 59.0 in | Wt 119.0 lb

## 2015-11-03 DIAGNOSIS — M1990 Unspecified osteoarthritis, unspecified site: Secondary | ICD-10-CM | POA: Diagnosis not present

## 2015-11-03 DIAGNOSIS — Z96641 Presence of right artificial hip joint: Secondary | ICD-10-CM

## 2015-11-03 DIAGNOSIS — M1612 Unilateral primary osteoarthritis, left hip: Secondary | ICD-10-CM | POA: Insufficient documentation

## 2015-11-03 DIAGNOSIS — M25551 Pain in right hip: Secondary | ICD-10-CM

## 2015-11-03 DIAGNOSIS — M25552 Pain in left hip: Secondary | ICD-10-CM

## 2015-11-03 NOTE — Assessment & Plan Note (Signed)
Having pain on the right side. 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 Patient does not know the reason why her hips didn't seem to deteriorate so fast. Patient is having pain on this side and we will get x-rays to make sure that there is no loosening noted. Patient will come back and see me again in 3-4 weeks. We discussed that her left hip will need to replace and she will need referral at some point.

## 2015-11-03 NOTE — Assessment & Plan Note (Signed)
Patient was given an injection today and tolerated the procedure very well. We discussed icing regimen. We discussed that this is only temporary and kneeling curative measure would be a hip replacement. Patient does have severe hip arthritis. The counter medications and icing protocol. We discussed proper shoes. Patient given a note to allow her to avoid certain activities at work. Patient come back until meters she is doing again in 3-4 weeks.

## 2015-11-03 NOTE — Patient Instructions (Signed)
Good to see you Ice 20 minutes 2 times daily. Usually after activity and before bed. pennsaid pinkie amount topically 2 times daily as needed.  Tylenol 500mg  3 times a day  Turmeric 500mg  twice daily Wear good shoes Good shoes with rigid bottom.  Jalene Mullet, Merrell or New balance greater then 700 Xrays downstairs today Continue the vitamin D You will need a hip replacement at some point See me again in 3-4 weeks.

## 2015-11-03 NOTE — Progress Notes (Signed)
Pre visit review using our clinic review tool, if applicable. No additional management support is needed unless otherwise documented below in the visit note. 

## 2015-11-03 NOTE — Progress Notes (Signed)
[facility] [address] [place], [place] [address] Phone: [phone] Subjective:    I'm seeing this patient by the request  of:  Scarlette Calico, MD   CC: Left hip pain  [id number] Maria Barker is a 54 y.o. female coming in with complaint of left hip pain. With reviewing patient's chart patient had had bilateral severe osteoarthritic changes. Patient did have a replacement on the right hip in 2011. Patient was to try to continue to walk on the hip. Patient states though that down the left side is giving her very similar presentation to the right side. Patient states that it as a dull, throbbing aching sensation. Worse with standing or walking. Patient feels there is some weakness. No numbness. Patient states that if she sits for most of the day she seems to do relatively well. Starting to affect daily activities. States that most the pain seems to be in the groin area. Rates the severity of pain as 9 out of 10. It is affecting her job. Unable to go up stairs. Feels that she has been having to put more pressure on her right on this is causing more pain as well. Sometimes feels like it is almost unstable.     Past Medical History  Diagnosis Date  . Arthritis     R THR 2011  . Hyperlipidemia LDL goal < 130   . Pain of left thigh   . Back pain   . Left hip pain   . Insomnia   . Frequent headaches   . Hypothyroid 02/06/2015    Dx 01/2015: TSH 5.4   Past Surgical History  Procedure Laterality Date  . 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 Tubal ligation    . Upper gastrointestinal endoscopy     Social History  Substance Use Topics  . Smoking status: Never Smoker   . Smokeless tobacco: Never Used  . Alcohol Use: No   Allergies  Allergen Reactions  . Asa [Aspirin]     UPSET STOMACH   Family History  Problem Relation Age of Onset  . CVA Mother   . Hypertension Mother      Past medical history, social, surgical and family history all  reviewed in electronic medical record.   Review of Systems: No headache, visual changes, nausea, vomiting, diarrhea, constipation, dizziness, abdominal pain, skin rash, fevers, chills, night sweats, weight loss, swollen lymph nodes, body aches, joint swelling, muscle aches, chest pain, shortness of breath, mood changes.   Objective Blood pressure 112/64, pulse 71, height 4\' 11"  (1.499 m), weight 119 lb (53.978 kg), SpO2 98 %.  General: No apparent distress alert and oriented x3 mood and affect normal, dressed appropriately.  HEENT: Pupils equal, extraocular movements intact  Respiratory: Patient's speak in full sentences and does not appear short of breath  Cardiovascular: No lower extremity edema, non tender, no erythema  Skin: Warm dry intact with no signs of infection or rash on extremities or on axial skeleton.  Abdomen: Soft nontender  Neuro: Cranial nerves II through XII are intact, neurovascularly intact in all extremities with 2+ DTRs and 2+ pulses.  Lymph: No lymphadenopathy of posterior or anterior cervical chain or axillae bilaterally.  Gait antalgic gait MSK:  Non tender with full range of motion and good stability and symmetric strength and tone of shoulders, elbows, wrist,  knee and ankles bilaterally.  Hip: Left ROM IR: 2 Deg with severe pain, ER:15 Deg, Flexion: 100 Deg, Extension:  80 Deg, Abduction: 35 Deg, Adduction: 35 Deg Strength IR: 4/5, ER: 4/5, Flexion: 4/5, Extension: 4/5, Abduction: 4/5, Adduction: 3/5 Greater trochanter without tenderness to palpation. Severe pain with FADIR. Mild pain with the sacroiliac joints bilaterally  Contralateral hip has hip replacement. Patient does have some pain over the groin and anterior aspect of the thigh. Neurovascularly intact distally. Strength moderately improved compared to contralateral side.  MSK US performed of: Left hip This study was ordered, performed, and interpreted by Charlann Boxer D.O.  Hip: Patient is severe  narrowing of the joint space. Minimal visualization of the anterior capsule noted. Significant spurring noted. Mild hypoechoic changes of the joint space.  IMPRESSION:  Severe arthritis of left hip   Procedure: Real-time Ultrasound Guided Injection of left intra-articular hip Device: GE Logiq E  Ultrasound guided injection is preferred based studies that show increased duration, increased effect, greater accuracy, decreased procedural pain, increased response rate with ultrasound guided versus blind injection.  Verbal informed consent obtained.  Time-out conducted.  Noted no overlying erythema, induration, or other signs of local infection.  Skin prepped in a sterile fashion.  Local anesthesia: Topical Ethyl chloride.  With sterile technique and under real time ultrasound guidance:  Anterior capsule visualized, needle visualized going to the head neck junction at the anterior capsule. Pictures taken. Patient did have injection of 3 cc of 1% lidocaine, 3 cc of 0.5% Marcaine, and 1 cc of Kenalog 40 mg/dL. Completed without difficulty  Pain immediately resolved suggesting accurate placement of the medication.  Advised to call if fevers/chills, erythema, induration, drainage, or persistent bleeding.  Images permanently stored and available for review in the ultrasound unit.  Impression: Technically successful ultrasound guided injection.    Impression and Recommendations:     This case required medical decision making of moderate complexity.

## 2015-11-26 ENCOUNTER — Ambulatory Visit (INDEPENDENT_AMBULATORY_CARE_PROVIDER_SITE_OTHER): Payer: Managed Care, Other (non HMO) | Admitting: Family Medicine

## 2015-11-26 ENCOUNTER — Encounter: Payer: Self-pay | Admitting: Family Medicine

## 2015-11-26 VITALS — BP 122/80 | HR 69 | Ht 59.0 in | Wt 117.0 lb

## 2015-11-26 DIAGNOSIS — M1990 Unspecified osteoarthritis, unspecified site: Secondary | ICD-10-CM

## 2015-11-26 DIAGNOSIS — M1612 Unilateral primary osteoarthritis, left hip: Secondary | ICD-10-CM

## 2015-11-26 MED ORDER — TRAMADOL HCL 50 MG PO TABS: 50.0000 mg | ORAL_TABLET | Freq: Four times a day (QID) | ORAL | Status: DC | PRN

## 2015-11-26 NOTE — Assessment & Plan Note (Signed)
Patient does have severe arthritis. Has failed all other conservative therapy did not respond well to an intra-articular injection. We discussed with patient about different treatment options. We discussed that her best option is likely to have hip replacement. Patient is going to have instability if she waits too much longer. We discussed continuing the icing, over-the-counter medications and given a prescription for tramadol. Warned of potential side effects. Patient will come back and see me again on an as-needed basis. Patient follow-up with her orthopedic surgeon.  Spent  25 minutes with patient face-to-face and had greater than 50% of counseling including as described above in assessment and plan.

## 2015-11-26 NOTE — Progress Notes (Signed)
[facility] [place], [place] [address] Phone: [phone] Subjective:    I'm seeing this patient by the request  of:  Scarlette Calico, MD   CC: Left hip pain  [id number] Maria Barker is a 54 y.o. female coming in with complaint of left hip pain. 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 Patient attempted to have a intra-articular injection. No significant improvement area and states that it only lasted hours. Still having this pain. Was unable to do light duty at work. Patient is having more and more pain. Pain is affecting her daily activities and waking her up at night.     Past Medical History  Diagnosis Date  . Arthritis     R THR 2011  . Hyperlipidemia LDL goal < 130   . Pain of left thigh   . Back pain   . Left hip pain   . Insomnia   . Frequent headaches   . Hypothyroid 02/06/2015    Dx 01/2015: TSH 5.4   Past Surgical History  Procedure Laterality Date  . 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 Tubal ligation    . Upper gastrointestinal endoscopy     Social History  Substance Use Topics  . Smoking status: Never Smoker   . Smokeless tobacco: Never Used  . Alcohol Use: No   Allergies  Allergen Reactions  . Asa [Aspirin]     UPSET STOMACH   Family History  Problem Relation Age of Onset  . CVA Mother   . Hypertension Mother      Past medical history, social, surgical and family history all reviewed in electronic medical record.   Review of Systems: No headache, visual changes, nausea, vomiting, diarrhea, constipation, dizziness, abdominal pain, skin rash, fevers, chills, night sweats, weight loss, swollen lymph nodes, body aches, joint swelling, muscle aches, chest pain, shortness of breath, mood changes.   Objective Blood pressure 122/80, pulse 69, height 4\' 11"  (1.499 m), weight 117 lb (53.071 kg), SpO2 96 %.  General: No  apparent distress alert and oriented x3 mood and affect normal, dressed appropriately.  HEENT: Pupils equal, extraocular movements intact  Respiratory: Patient's speak in full sentences and does not appear short of breath  Cardiovascular: No lower extremity edema, non tender, no erythema  Skin: Warm dry intact with no signs of infection or rash on extremities or on axial skeleton.  Abdomen: Soft nontender  Neuro: Cranial nerves II through XII are intact, neurovascularly intact in all extremities with 2+ DTRs and 2+ pulses.  Lymph: No lymphadenopathy of posterior or anterior cervical chain or axillae bilaterally.  Gait antalgic gait MSK:  Non tender with full range of motion and good stability and symmetric strength and tone of shoulders, elbows, wrist,  knee and ankles bilaterally.  Hip: Left ROM IR: 2 Deg with severe pain, ER:15 Deg, Flexion: 100 Deg, Extension: 80 Deg, Abduction: 35 Deg, Adduction: 35 Deg Strength IR: 4/5, ER: 4/5, Flexion: 4/5, Extension: 4/5, Abduction: 4/5, Adduction: 3/5 Greater trochanter without tenderness to palpation. Severe pain with FADIR. Mild pain with the sacroiliac joints bilaterally  Contralateral hip has hip replacement. Patient does have some pain over the groin and anterior aspect of the thigh. Neurovascularly intact distally. Strength moderately improved compared to contralateral side.     Impression and Recommendations:     This case required medical decision making of  moderate complexity.

## 2015-11-26 NOTE — Progress Notes (Signed)
Pre visit review using our clinic review tool, if applicable. No additional management support is needed unless otherwise documented below in the visit note. 

## 2015-11-26 NOTE — Patient Instructions (Addendum)
  Good to see you Can try tramadol up to 3 times a day but may make you sleepy Call if you have questions.  Happy holidays!

## 2015-12-18 ENCOUNTER — Telehealth: Payer: Self-pay | Admitting: Family Medicine

## 2015-12-18 DIAGNOSIS — M25551 Pain in right hip: Secondary | ICD-10-CM

## 2015-12-18 NOTE — Telephone Encounter (Signed)
Pt called state that she want to go a head and get the surgery for her right leg. Pt would like if we can refer her to Dr. Malva Cogan? (she saw him couple times before about her leg). Please give her a call back

## 2015-12-18 NOTE — Telephone Encounter (Signed)
Referral entered  

## 2015-12-19 ENCOUNTER — Other Ambulatory Visit: Payer: Self-pay | Admitting: Internal Medicine

## 2015-12-19 NOTE — Telephone Encounter (Signed)
Faxed script back to [facility].../lmb 

## 2015-12-19 NOTE — Telephone Encounter (Signed)
MD out of office. Is this ok to refill.../lmb 

## 2016-01-09 ENCOUNTER — Other Ambulatory Visit (HOSPITAL_COMMUNITY): Payer: Self-pay | Admitting: Orthopedic Surgery

## 2016-01-09 DIAGNOSIS — M25551 Pain in right hip: Secondary | ICD-10-CM

## 2016-01-15 ENCOUNTER — Encounter (HOSPITAL_COMMUNITY)
Admission: RE | Admit: 2016-01-15 | Discharge: 2016-01-15 | Disposition: A | Discharge status: Home / Self Care | Payer: Managed Care, Other (non HMO) | Source: Ambulatory Visit | Attending: Orthopedic Surgery | Admitting: Orthopedic Surgery

## 2016-01-15 ENCOUNTER — Other Ambulatory Visit (HOSPITAL_COMMUNITY): Payer: Self-pay | Admitting: Orthopedic Surgery

## 2016-01-15 DIAGNOSIS — M25551 Pain in right hip: Secondary | ICD-10-CM | POA: Insufficient documentation

## 2016-01-15 MED ORDER — TECHNETIUM TC 99M MEDRONATE IV KIT
25.8000 | PACK | Freq: Once | INTRAVENOUS | Status: AC | PRN
  Administered 2016-01-15: 25.8 via INTRAVENOUS

## 2016-01-16 ENCOUNTER — Other Ambulatory Visit: Payer: Self-pay | Admitting: Orthopedic Surgery

## 2016-01-26 ENCOUNTER — Telehealth: Payer: Self-pay | Admitting: Internal Medicine

## 2016-01-26 NOTE — Telephone Encounter (Signed)
Okay 

## 2016-01-26 NOTE — Telephone Encounter (Signed)
Ok with me 

## 2016-01-26 NOTE — Telephone Encounter (Signed)
Pt request to transfer from Dr. Ronnald Ramp to Dr. Quay Burow. Please advise, would like female doctor.

## 2016-01-30 ENCOUNTER — Inpatient Hospital Stay (HOSPITAL_COMMUNITY): Admission: RE | Admit: 2016-01-30 | Payer: Managed Care, Other (non HMO) | Source: Ambulatory Visit

## 2016-02-05 ENCOUNTER — Ambulatory Visit (INDEPENDENT_AMBULATORY_CARE_PROVIDER_SITE_OTHER): Payer: Managed Care, Other (non HMO) | Admitting: Internal Medicine

## 2016-02-05 DIAGNOSIS — F5104 Psychophysiologic insomnia: Secondary | ICD-10-CM

## 2016-02-05 DIAGNOSIS — K219 Gastro-esophageal reflux disease without esophagitis: Secondary | ICD-10-CM | POA: Diagnosis not present

## 2016-02-05 DIAGNOSIS — E038 Other specified hypothyroidism: Secondary | ICD-10-CM | POA: Diagnosis not present

## 2016-02-05 DIAGNOSIS — M1612 Unilateral primary osteoarthritis, left hip: Secondary | ICD-10-CM

## 2016-02-05 DIAGNOSIS — M1990 Unspecified osteoarthritis, unspecified site: Secondary | ICD-10-CM

## 2016-02-05 DIAGNOSIS — D510 Vitamin B12 deficiency anemia due to intrinsic factor deficiency: Secondary | ICD-10-CM

## 2016-02-05 DIAGNOSIS — G4700 Insomnia, unspecified: Secondary | ICD-10-CM

## 2016-02-05 DIAGNOSIS — E785 Hyperlipidemia, unspecified: Secondary | ICD-10-CM

## 2016-02-05 MED ORDER — CALCIUM CARB-CHOLECALCIFEROL 600-800 MG-UNIT PO TABS: ORAL_TABLET | ORAL | Status: DC

## 2016-02-05 MED ORDER — ZALEPLON 5 MG PO CAPS: 5.0000 mg | ORAL_CAPSULE | Freq: Every evening | ORAL | Status: DC | PRN

## 2016-02-05 MED ORDER — DICLOFENAC POTASSIUM 50 MG PO TABS: 50.0000 mg | ORAL_TABLET | Freq: Two times a day (BID) | ORAL | Status: DC

## 2016-02-05 NOTE — Patient Instructions (Signed)
  We have reviewed your prior records including labs and tests today.  Test(s) ordered today. Your results will be released to [facility] (or called to you) after review, usually within 72hours after test completion. If any changes need to be made, you will be notified at that same time.  All other Health Maintenance issues reviewed.   All recommended immunizations and age-appropriate screenings are up-to-date.  No immunizations administered today.   Medications reviewed and updated.  Changes include trying a new sleep medication called sonata.  Stop the the [place].    Your prescription(s) have been submitted to your pharmacy. Please take as directed and contact our office if you believe you are having problem(s) with the medication(s).   Please schedule followup in 6 months for a physical

## 2016-02-05 NOTE — Assessment & Plan Note (Signed)
She takes zantac only as needed

## 2016-02-05 NOTE — Progress Notes (Signed)
Pre visit review using our clinic review tool, if applicable. No additional management support is needed unless otherwise documented below in the visit note. 

## 2016-02-05 NOTE — Progress Notes (Signed)
Subjective:    Patient ID: Maria Barker, female    DOB: 05-Aug-1961, 55 y.o.   MRN: [id number]  HPI She is here to establish with a new pcp.   Insomnia, chronic:  She has a long history of insomnia.  She was on trazodone and it did not work well. She has also been on elavil. She takes Ambien and she only sleeps for 2-3 hours  She goes to bed 1-2 am because she works until 12:30.  She wakes frequently for no reason.  She thinks she only sleeps for about 3 hours.    Severe hip arthritis, left:  She has severe left arthritis and is considering a replacement.  She takes diclofenac two times daily.  It helps her walk.    Hyperlipidemia: She is taking her medication daily. She is compliant with a low fat/cholesterol diet. She is not exercising regularly due to her hip pain.   Hypothyroidism:  She is taking her medication daily.  She denies any recent changes in energy or weight that are unexplained.  Her energy level is very low, but there has been no change - this is from not sleeping.     Medications and allergies reviewed with patient and updated if appropriate.  Patient Active Problem List   Diagnosis Date Noted  . Arthritis of left hip 11/03/2015  . History of total replacement of right hip 11/03/2015  . Pernicious anemia 08/12/2015  . Cephalalgia 08/12/2015  . Family history of brain aneurysm 08/12/2015  . Gastroesophageal reflux disease without esophagitis 08/12/2015  . Hypothyroid 02/06/2015  . Abnormal LFTs 02/06/2015  . Depression   . Hyperlipidemia with target LDL less than 130   . Osteoarthritis, hip, bilateral 04/13/2013  . Chronic insomnia 04/13/2013    Current Outpatient Prescriptions on File Prior to Visit  Medication Sig Dispense Refill  . acetaminophen (TYLENOL) 650 MG CR tablet Take 650 mg by mouth every 8 (eight) hours as needed for pain.    Marland Kitchen atorvastatin (LIPITOR) 10 MG tablet TAKE ONE TABLET BY MOUTH ONCE DAILY 90 tablet 1  . Calcium Carbonate-Vitamin D  (CALCIUM-VITAMIN D3 PO) Take 1 each by mouth 2 (two) times daily.    . diclofenac (CATAFLAM) 50 MG tablet Take 1 tablet (50 mg total) by mouth 2 (two) times daily. 60 tablet 5  . levothyroxine (SYNTHROID, LEVOTHROID) 50 MCG tablet Take 1 tablet (50 mcg total) by mouth daily before breakfast. 90 tablet 3  . ranitidine (ZANTAC) 150 MG tablet Take 1 tablet (150 mg total) by mouth 2 (two) times daily. 60 tablet 11  . zolpidem (AMBIEN) 10 MG tablet TAKE ONE-HALF TO ONE TABLET BY MOUTH AT BEDTIME AS NEEDED FOR SLEEP 90 tablet 0   No current facility-administered medications on file prior to visit.    Past Medical History  Diagnosis Date  . Arthritis     R THR 2011  . Hyperlipidemia LDL goal < 130   . Pain of left thigh   . Back pain   . Left hip pain   . Insomnia   . Frequent headaches   . Hypothyroid 02/06/2015    Dx 01/2015: TSH 5.4    Past Surgical History  Procedure Laterality Date  . 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 Tubal ligation    . Upper gastrointestinal endoscopy      Social History   Social History  . Marital Status: Married    Spouse Name: N/A  . Number of  Children: 5  . Years of Education: N/A   Occupational History  . machine operator    Social History Main Topics  . Smoking status: Never Smoker   . Smokeless tobacco: Never Used  . Alcohol Use: No  . Drug Use: No  . Sexual Activity: Not on file   Other Topics Concern  . Not on file   Social History Narrative    Family History  Problem Relation Age of Onset  . CVA Mother   . Hypertension Mother     Review of Systems  Constitutional: Positive for fatigue. Negative for fever.  Respiratory: Negative for cough, shortness of breath and wheezing.   Cardiovascular: Positive for chest pain (only related to loud noise) and palpitations (when she wakes in the morning). Negative for leg swelling.  Gastrointestinal: Negative for abdominal pain.       Occasional GERD  Neurological: Positive for  light-headedness and headaches (occasional). Negative for dizziness.  Psychiatric/Behavioral: Positive for sleep disturbance.       Objective:   Filed Vitals:   02/05/16 0928  BP: 136/90  Pulse: 62  Temp: 97.8 F (36.6 C)  Resp: 16   Filed Weights   02/05/16 0928  Weight: 118 lb (53.524 kg)   Body mass index is 23.82 kg/(m^2).   Physical Exam Constitutional: Appears well-developed and well-nourished. No distress.  Neck: Neck supple. No tracheal deviation present. No thyromegaly present.  No carotid bruit. No cervical adenopathy.   Cardiovascular: Normal rate, regular rhythm and normal heart sounds.   No murmur heard.  No edema Pulmonary/Chest: Effort normal and breath sounds normal. No respiratory distress. No wheezes.       Assessment & Plan:   See Problem List for Assessment and Plan of chronic medical problems.  Follow up in 6 months, sooner if needed

## 2016-02-07 ENCOUNTER — Encounter: Payer: Self-pay | Admitting: Internal Medicine

## 2016-02-07 NOTE — Assessment & Plan Note (Signed)
Check tsh Will titrate med dose if needed

## 2016-02-07 NOTE — Assessment & Plan Note (Signed)
Taking lipitor 10 mg daily Check lipid panel

## 2016-02-07 NOTE — Assessment & Plan Note (Signed)
Clonazepam made her dizzy, ambien, elavil, trazodone were not effective Will try sonata

## 2016-02-07 NOTE — Assessment & Plan Note (Signed)
Not currently taking B12 Check B12 level

## 2016-02-07 NOTE — Assessment & Plan Note (Signed)
Severe Taking diclofenac twice daily which helps Considering surgery

## 2016-02-09 ENCOUNTER — Other Ambulatory Visit (INDEPENDENT_AMBULATORY_CARE_PROVIDER_SITE_OTHER): Payer: Managed Care, Other (non HMO)

## 2016-02-09 DIAGNOSIS — E785 Hyperlipidemia, unspecified: Secondary | ICD-10-CM

## 2016-02-09 DIAGNOSIS — E038 Other specified hypothyroidism: Secondary | ICD-10-CM

## 2016-02-09 DIAGNOSIS — D510 Vitamin B12 deficiency anemia due to intrinsic factor deficiency: Secondary | ICD-10-CM | POA: Diagnosis not present

## 2016-02-09 LAB — COMPREHENSIVE METABOLIC PANEL
ALK PHOS: 81 U/L (ref 39–117)
ALT: 21 U/L (ref 0–35)
AST: 24 U/L (ref 0–37)
Albumin: 4.2 g/dL (ref 3.5–5.2)
BILIRUBIN TOTAL: 0.4 mg/dL (ref 0.2–1.2)
BUN: 15 mg/dL (ref 6–23)
CALCIUM: 9.6 mg/dL (ref 8.4–10.5)
CO2: 28 mEq/L (ref 19–32)
Chloride: 104 mEq/L (ref 96–112)
Creatinine, Ser: 0.64 mg/dL (ref 0.40–1.20)
GFR: 103.3 mL/min (ref >60.00)
Glucose, Bld: 88 mg/dL (ref 70–99)
Potassium: 4.3 mEq/L (ref 3.5–5.1)
Sodium: 137 mEq/L (ref 135–145)
TOTAL PROTEIN: 8.3 g/dL (ref 6.0–8.3)

## 2016-02-09 LAB — LIPID PANEL
Cholesterol: 167 mg/dL (ref 0–200)
HDL: 60.6 mg/dL (ref >39.00)
LDL Cholesterol: 88 mg/dL (ref 0–99)
NONHDL: 106.65
TRIGLYCERIDES: 94 mg/dL (ref 0.0–149.0)
Total CHOL/HDL Ratio: 3
VLDL: 18.8 mg/dL (ref 0.0–40.0)

## 2016-02-09 LAB — TSH: TSH: 1.03 u[IU]/mL (ref 0.35–4.50)

## 2016-02-09 LAB — VITAMIN B12: Vitamin B-12: 802 pg/mL (ref 211–911)

## 2016-02-16 ENCOUNTER — Ambulatory Visit: Payer: Managed Care, Other (non HMO) | Admitting: Internal Medicine

## 2016-02-26 NOTE — Pre-Procedure Instructions (Signed)
    [name]  02/26/2016      [facility] - [place]), [place] - [place] [id number] [place] [address] Phone: [phone] Fax: [phone]    Your procedure is scheduled on Monday, March 13th, 2017.  Report to [facility] Admitting at 8:30 A.M.  Call this number if you have problems the morning of surgery:  [phone]   Remember:  Do not eat food or drink liquids after midnight.   Take these medicines the morning of surgery with A SIP OF WATER      TYLENOL, LEVOTHYROXINE( SYNTHROID),     (STOP DICLOFENAC/ CATAFLAM, VITAMINS, HERBAL MEDICINES, IBUPROFEN/ MOTRIN/ ADVIL)   Do not wear jewelry, make-up or nail polish.  Do not wear lotions, powders, or perfumes.    Do not shave 48 hours prior to surgery.    Do not bring valuables to the hospital.   [facility] is not responsible for any belongings or valuables.  Contacts, dentures or bridgework may not be worn into surgery.  Leave your suitcase in the car.  After surgery it may be brought to your room.  For patients admitted to the hospital, discharge time will be determined by your treatment team.  Patients discharged the day of surgery will not be allowed to drive home.   Special instructions:  See attached.   Please read over the following fact sheets that you were given. Pain Booklet, Coughing and Deep Breathing, Blood Transfusion Information, Total Joint Packet, MRSA Information and Surgical Site Infection Prevention

## 2016-02-27 ENCOUNTER — Encounter (HOSPITAL_COMMUNITY)
Admission: RE | Admit: 2016-02-27 | Discharge: 2016-02-27 | Disposition: A | Discharge status: Home / Self Care | Payer: Managed Care, Other (non HMO) | Source: Ambulatory Visit | Attending: Orthopedic Surgery | Admitting: Orthopedic Surgery

## 2016-02-27 ENCOUNTER — Ambulatory Visit (HOSPITAL_COMMUNITY)
Admission: RE | Admit: 2016-02-27 | Discharge: 2016-02-27 | Disposition: A | Discharge status: Home / Self Care | Payer: Managed Care, Other (non HMO) | Source: Ambulatory Visit | Attending: Orthopedic Surgery | Admitting: Orthopedic Surgery

## 2016-02-27 ENCOUNTER — Encounter (HOSPITAL_COMMUNITY): Payer: Self-pay

## 2016-02-27 DIAGNOSIS — E785 Hyperlipidemia, unspecified: Secondary | ICD-10-CM | POA: Diagnosis not present

## 2016-02-27 DIAGNOSIS — Z01818 Encounter for other preprocedural examination: Secondary | ICD-10-CM | POA: Diagnosis present

## 2016-02-27 DIAGNOSIS — Z01812 Encounter for preprocedural laboratory examination: Secondary | ICD-10-CM | POA: Diagnosis not present

## 2016-02-27 DIAGNOSIS — R001 Bradycardia, unspecified: Secondary | ICD-10-CM | POA: Diagnosis not present

## 2016-02-27 DIAGNOSIS — Z01810 Encounter for preprocedural cardiovascular examination: Secondary | ICD-10-CM | POA: Insufficient documentation

## 2016-02-27 DIAGNOSIS — Z79899 Other long term (current) drug therapy: Secondary | ICD-10-CM | POA: Diagnosis not present

## 2016-02-27 LAB — CBC WITH DIFFERENTIAL/PLATELET
Basophils Absolute: 0.2 10*3/uL — ABNORMAL HIGH (ref 0.0–0.1)
Basophils Relative: 2 %
Eosinophils Absolute: 2 10*3/uL — ABNORMAL HIGH (ref 0.0–0.7)
Eosinophils Relative: 20 %
HCT: 38.4 % (ref 36.0–46.0)
HEMOGLOBIN: 12.1 g/dL (ref 12.0–15.0)
LYMPHS ABS: 2 10*3/uL (ref 0.7–4.0)
LYMPHS PCT: 20 %
MCH: 26.7 pg (ref 26.0–34.0)
MCHC: 31.5 g/dL (ref 30.0–36.0)
MCV: 84.8 fL (ref 78.0–100.0)
MONOS PCT: 7 %
Monocytes Absolute: 0.8 10*3/uL (ref 0.1–1.0)
NEUTROS ABS: 5.3 10*3/uL (ref 1.7–7.7)
NEUTROS PCT: 51 %
Platelets: 357 10*3/uL (ref 150–400)
RBC: 4.53 MIL/uL (ref 3.87–5.11)
RDW: 13.8 % (ref 11.5–15.5)
WBC: 10.3 10*3/uL (ref 4.0–10.5)

## 2016-02-27 LAB — URINALYSIS, ROUTINE W REFLEX MICROSCOPIC
Bilirubin Urine: NEGATIVE
GLUCOSE, UA: NEGATIVE mg/dL
Hgb urine dipstick: NEGATIVE
KETONES UR: NEGATIVE mg/dL
LEUKOCYTES UA: NEGATIVE
Nitrite: NEGATIVE
PH: 7 (ref 5.0–8.0)
Protein, ur: NEGATIVE mg/dL
Specific Gravity, Urine: 1.008 (ref 1.005–1.030)

## 2016-02-27 LAB — BASIC METABOLIC PANEL
ANION GAP: 10 (ref 5–15)
BUN: 10 mg/dL (ref 6–20)
CHLORIDE: 102 mmol/L (ref 101–111)
CO2: 24 mmol/L (ref 22–32)
CREATININE: 0.58 mg/dL (ref 0.44–1.00)
Calcium: 9.7 mg/dL (ref 8.9–10.3)
GFR calc non Af Amer: >60 mL/min (ref >60)
Glucose, Bld: 92 mg/dL (ref 65–99)
POTASSIUM: 4 mmol/L (ref 3.5–5.1)
SODIUM: 136 mmol/L (ref 135–145)

## 2016-02-27 LAB — APTT: aPTT: 30 seconds (ref 24–37)

## 2016-02-27 LAB — PROTIME-INR
INR: 1.18 (ref 0.00–1.49)
PROTHROMBIN TIME: 15.2 s (ref 11.6–15.2)

## 2016-02-27 LAB — TYPE AND SCREEN
ABO/RH(D): A POS
ANTIBODY SCREEN: NEGATIVE

## 2016-02-27 LAB — ABO/RH: ABO/RH(D): A POS

## 2016-02-27 LAB — SURGICAL PCR SCREEN
MRSA, PCR: NEGATIVE
Staphylococcus aureus: NEGATIVE

## 2016-03-04 NOTE — H&P (Signed)
TOTAL HIP ADMISSION H&P  Patient is admitted for left total hip arthroplasty.  Subjective:  Chief Complaint: left hip pain  HPI: Maria Barker, 55 y.o. female, has a history of pain and functional disability in the left hip(s) due to arthritis and patient has failed non-surgical conservative treatments for greater than 12 weeks to include NSAID's and/or analgesics, flexibility and strengthening excercises, use of assistive devices, weight reduction as appropriate and activity modification.  Onset of symptoms was gradual starting 6 years ago with gradually worsening course since that time.The patient noted no past surgery on the left hip(s).  Patient currently rates pain in the left hip at 10 out of 10 with activity. Patient has night pain, worsening of pain with activity and weight bearing, trendelenberg gait, pain that interfers with activities of daily living, pain with passive range of motion and crepitus. Patient has evidence of subchondral cysts, periarticular osteophytes, joint subluxation and joint space narrowing by imaging studies. This condition presents safety issues increasing the risk of falls.   There is no current active infection.  Patient Active Problem List   Diagnosis Date Noted  . Arthritis of left hip 11/03/2015  . History of total replacement of right hip 11/03/2015  . Pernicious anemia 08/12/2015  . Cephalalgia 08/12/2015  . Family history of brain aneurysm 08/12/2015  . Gastroesophageal reflux disease without esophagitis 08/12/2015  . Hypothyroid 02/06/2015  . Depression   . Hyperlipidemia with target LDL less than 130   . Osteoarthritis, hip, bilateral 04/13/2013  . Chronic insomnia 04/13/2013   Past Medical History  Diagnosis Date  . Arthritis     R THR 2011  . Hyperlipidemia LDL goal < 130   . Pain of left thigh   . Back pain   . Left hip pain   . Insomnia   . Frequent headaches   . Hypothyroid 02/06/2015    Dx 01/2015: TSH 5.4    Past Surgical History   Procedure Laterality Date  . 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 Tubal ligation    . Upper gastrointestinal endoscopy      No prescriptions prior to admission   Allergies  Allergen Reactions  . Asa [Aspirin]     UPSET STOMACH    Social History  Substance Use Topics  . Smoking status: Never Smoker   . Smokeless tobacco: Never Used  . Alcohol Use: No    Family History  Problem Relation Age of Onset  . CVA Mother   . Hypertension Mother      Review of Systems  Constitutional: Negative.   HENT: Positive for hearing loss.   Eyes: Positive for blurred vision.  Respiratory: Negative.   Cardiovascular: Negative.   Gastrointestinal: Negative.   Genitourinary: Negative.   Musculoskeletal: Positive for joint pain.  Skin: Negative.   Neurological: Positive for dizziness and headaches.  Endo/Heme/Allergies: Negative.   Psychiatric/Behavioral: Negative.     Objective:  Physical Exam  Constitutional: She is oriented to person, place, and time. She appears well-developed and well-nourished.  HENT:  Head: Normocephalic and atraumatic.  Eyes: Pupils are equal, round, and reactive to light.  Neck: Normal range of motion. Neck supple.  Cardiovascular: Intact distal pulses.   Respiratory: Effort normal.  Musculoskeletal: She exhibits tenderness.  she continues to have obvious pain with any motion of the left hip.  Again she continues to have limited range of motion with virtually no motion with internal and external rotation.  She does walk with a  Trendelenburg gait and obvious limp on the left side.  She is neurovascularly intact distally.  Neurological: She is alert and oriented to person, place, and time.  Skin: Skin is warm and dry.  Psychiatric: She has a normal mood and affect. Her behavior is normal. Judgment and thought content normal.    Vital signs in last 24 hours:    Labs:   Estimated body mass index is 23.62 kg/(m^2) as calculated from the  following:   Height as of 11/26/15: 4\' 11"  (1.499 m).   Weight as of 11/26/15: 53.071 kg (117 lb).   Imaging Review Plain radiographs demonstrate severe end-stage arthritis with periarticular osteophytes and lateral subluxation of the femoral head.  She also has subchondral cyst formation.  Patient right total hip which appears to be a Tri-Lock stem appears to be well-placed.  I do question whether she has a halo visible on the crosstable lateral view possibly suggest a wobble track.  Assessment/Plan:  End stage arthritis, left hip(s)  The patient history, physical examination, clinical judgement of the provider and imaging studies are consistent with end stage degenerative joint disease of the left hip(s) and total hip arthroplasty is deemed medically necessary. The treatment options including medical management, injection therapy, arthroscopy and arthroplasty were discussed at length. The risks and benefits of total hip arthroplasty were presented and reviewed. The risks due to aseptic loosening, infection, stiffness, dislocation/subluxation,  thromboembolic complications and other imponderables were discussed.  The patient acknowledged the explanation, agreed to proceed with the plan and consent was signed. Patient is being admitted for inpatient treatment for surgery, pain control, PT, OT, prophylactic antibiotics, VTE prophylaxis, progressive ambulation and ADL's and discharge planning.The patient is planning to be discharged home with home health services

## 2016-03-05 MED ORDER — DEXTROSE-NACL 5-0.45 % IV SOLN: INTRAVENOUS | Status: DC

## 2016-03-05 MED ORDER — CEFAZOLIN SODIUM-DEXTROSE 2-3 GM-% IV SOLR
2.0000 g | INTRAVENOUS | Status: AC
  Administered 2016-03-08: 2 g via INTRAVENOUS
  Filled 2016-03-05: qty 50

## 2016-03-07 DIAGNOSIS — M1612 Unilateral primary osteoarthritis, left hip: Secondary | ICD-10-CM | POA: Diagnosis present

## 2016-03-08 ENCOUNTER — Encounter (HOSPITAL_COMMUNITY)
Admission: RE | Disposition: A | Discharge status: Home / Self Care | Payer: Self-pay | Source: Ambulatory Visit | Attending: Orthopedic Surgery

## 2016-03-08 ENCOUNTER — Encounter (HOSPITAL_COMMUNITY): Payer: Self-pay | Admitting: *Deleted

## 2016-03-08 ENCOUNTER — Inpatient Hospital Stay (HOSPITAL_COMMUNITY): Payer: Managed Care, Other (non HMO) | Admitting: Certified Registered Nurse Anesthetist

## 2016-03-08 ENCOUNTER — Inpatient Hospital Stay (HOSPITAL_COMMUNITY): Payer: Managed Care, Other (non HMO)

## 2016-03-08 ENCOUNTER — Inpatient Hospital Stay (HOSPITAL_COMMUNITY)
Admission: RE | Admit: 2016-03-08 | Discharge: 2016-03-10 | DRG: 470 | Disposition: A | Discharge status: Home / Self Care | Payer: Managed Care, Other (non HMO) | Source: Ambulatory Visit | Attending: Orthopedic Surgery | Admitting: Orthopedic Surgery

## 2016-03-08 DIAGNOSIS — K219 Gastro-esophageal reflux disease without esophagitis: Secondary | ICD-10-CM | POA: Diagnosis present

## 2016-03-08 DIAGNOSIS — F329 Major depressive disorder, single episode, unspecified: Secondary | ICD-10-CM | POA: Diagnosis present

## 2016-03-08 DIAGNOSIS — Z8249 Family history of ischemic heart disease and other diseases of the circulatory system: Secondary | ICD-10-CM | POA: Diagnosis not present

## 2016-03-08 DIAGNOSIS — G4700 Insomnia, unspecified: Secondary | ICD-10-CM | POA: Diagnosis present

## 2016-03-08 DIAGNOSIS — D510 Vitamin B12 deficiency anemia due to intrinsic factor deficiency: Secondary | ICD-10-CM | POA: Diagnosis present

## 2016-03-08 DIAGNOSIS — E785 Hyperlipidemia, unspecified: Secondary | ICD-10-CM | POA: Diagnosis present

## 2016-03-08 DIAGNOSIS — Z96649 Presence of unspecified artificial hip joint: Secondary | ICD-10-CM

## 2016-03-08 DIAGNOSIS — M1632 Unilateral osteoarthritis resulting from hip dysplasia, left hip: Secondary | ICD-10-CM | POA: Diagnosis present

## 2016-03-08 DIAGNOSIS — E039 Hypothyroidism, unspecified: Secondary | ICD-10-CM | POA: Diagnosis present

## 2016-03-08 DIAGNOSIS — M25552 Pain in left hip: Secondary | ICD-10-CM | POA: Diagnosis present

## 2016-03-08 DIAGNOSIS — D62 Acute posthemorrhagic anemia: Secondary | ICD-10-CM | POA: Diagnosis not present

## 2016-03-08 DIAGNOSIS — Z96641 Presence of right artificial hip joint: Secondary | ICD-10-CM | POA: Diagnosis present

## 2016-03-08 DIAGNOSIS — M1612 Unilateral primary osteoarthritis, left hip: Secondary | ICD-10-CM | POA: Diagnosis present

## 2016-03-08 DIAGNOSIS — Z886 Allergy status to analgesic agent status: Secondary | ICD-10-CM | POA: Diagnosis not present

## 2016-03-08 HISTORY — PX: TOTAL HIP ARTHROPLASTY: SHX124

## 2016-03-08 SURGERY — ARTHROPLASTY, HIP, TOTAL,POSTERIOR APPROACH
Anesthesia: Spinal | Site: Hip | Laterality: Left

## 2016-03-08 MED ORDER — HYDROMORPHONE HCL 1 MG/ML IJ SOLN
0.5000 mg | INTRAMUSCULAR | Status: DC | PRN
  Administered 2016-03-08 – 2016-03-09 (×3): 1 mg via INTRAVENOUS
  Filled 2016-03-08 (×2): qty 1

## 2016-03-08 MED ORDER — PHENYLEPHRINE HCL 10 MG/ML IJ SOLN
INTRAMUSCULAR | Status: DC | PRN
  Administered 2016-03-08 (×3): 80 ug via INTRAVENOUS

## 2016-03-08 MED ORDER — GLYCOPYRROLATE 0.2 MG/ML IJ SOLN
INTRAMUSCULAR | Status: DC | PRN
  Administered 2016-03-08: 0.2 mg via INTRAVENOUS

## 2016-03-08 MED ORDER — APIXABAN 2.5 MG PO TABS
2.5000 mg | ORAL_TABLET | Freq: Two times a day (BID) | ORAL | Status: DC
  Administered 2016-03-09 – 2016-03-10 (×3): 2.5 mg via ORAL
  Filled 2016-03-08 (×3): qty 1

## 2016-03-08 MED ORDER — PROPOFOL 500 MG/50ML IV EMUL
INTRAVENOUS | Status: DC | PRN
  Administered 2016-03-08: 25 ug/kg/min via INTRAVENOUS

## 2016-03-08 MED ORDER — FENTANYL CITRATE (PF) 250 MCG/5ML IJ SOLN
INTRAMUSCULAR | Status: DC | PRN
  Administered 2016-03-08 (×4): 25 ug via INTRAVENOUS

## 2016-03-08 MED ORDER — BISACODYL 5 MG PO TBEC: 5.0000 mg | DELAYED_RELEASE_TABLET | Freq: Every day | ORAL | Status: DC | PRN

## 2016-03-08 MED ORDER — ACETAMINOPHEN 650 MG RE SUPP: 650.0000 mg | Freq: Four times a day (QID) | RECTAL | Status: DC | PRN

## 2016-03-08 MED ORDER — ONDANSETRON HCL 4 MG/2ML IJ SOLN
4.0000 mg | Freq: Four times a day (QID) | INTRAMUSCULAR | Status: DC | PRN
  Administered 2016-03-08 – 2016-03-09 (×3): 4 mg via INTRAVENOUS
  Filled 2016-03-08 (×4): qty 2

## 2016-03-08 MED ORDER — DOCUSATE SODIUM 100 MG PO CAPS
100.0000 mg | ORAL_CAPSULE | Freq: Two times a day (BID) | ORAL | Status: DC
  Administered 2016-03-08 – 2016-03-10 (×4): 100 mg via ORAL
  Filled 2016-03-08 (×4): qty 1

## 2016-03-08 MED ORDER — METHOCARBAMOL 1000 MG/10ML IJ SOLN
500.0000 mg | Freq: Four times a day (QID) | INTRAVENOUS | Status: DC | PRN
  Filled 2016-03-08: qty 5

## 2016-03-08 MED ORDER — POLYETHYLENE GLYCOL 3350 17 G PO PACK: 17.0000 g | PACK | Freq: Every day | ORAL | Status: DC | PRN

## 2016-03-08 MED ORDER — MENTHOL 3 MG MT LOZG: 1.0000 | LOZENGE | OROMUCOSAL | Status: DC | PRN

## 2016-03-08 MED ORDER — DIPHENHYDRAMINE HCL 12.5 MG/5ML PO ELIX: 12.5000 mg | ORAL_SOLUTION | ORAL | Status: DC | PRN

## 2016-03-08 MED ORDER — HYDROMORPHONE HCL 1 MG/ML IJ SOLN
INTRAMUSCULAR | Status: AC
Start: 2016-03-08 — End: 2016-03-08
  Administered 2016-03-08: 1 mg via INTRAVENOUS
  Filled 2016-03-08: qty 1

## 2016-03-08 MED ORDER — PHENOL 1.4 % MT LIQD: 1.0000 | OROMUCOSAL | Status: DC | PRN

## 2016-03-08 MED ORDER — APIXABAN 2.5 MG PO TABS: 2.5000 mg | ORAL_TABLET | Freq: Two times a day (BID) | ORAL | Status: DC

## 2016-03-08 MED ORDER — VITAMIN B-12 1000 MCG PO TABS
1000.0000 ug | ORAL_TABLET | Freq: Every day | ORAL | Status: DC
  Administered 2016-03-09: 1000 ug via ORAL
  Filled 2016-03-08 (×2): qty 1

## 2016-03-08 MED ORDER — FENTANYL CITRATE (PF) 100 MCG/2ML IJ SOLN: 25.0000 ug | INTRAMUSCULAR | Status: DC | PRN

## 2016-03-08 MED ORDER — METHOCARBAMOL 500 MG PO TABS: 500.0000 mg | ORAL_TABLET | Freq: Two times a day (BID) | ORAL | Status: DC

## 2016-03-08 MED ORDER — SODIUM CHLORIDE 0.9 % IV SOLN
2000.0000 mg | Freq: Once | INTRAVENOUS | Status: AC
  Administered 2016-03-08: 2000 mg via TOPICAL
  Filled 2016-03-08: qty 20

## 2016-03-08 MED ORDER — SODIUM CHLORIDE 0.9 % IR SOLN
Status: DC | PRN
  Administered 2016-03-08: 1000 mL

## 2016-03-08 MED ORDER — ONDANSETRON HCL 4 MG PO TABS: 4.0000 mg | ORAL_TABLET | Freq: Four times a day (QID) | ORAL | Status: DC | PRN

## 2016-03-08 MED ORDER — BUPIVACAINE-EPINEPHRINE 0.5% -1:200000 IJ SOLN
INTRAMUSCULAR | Status: DC | PRN
  Administered 2016-03-08: 20 mL

## 2016-03-08 MED ORDER — BUPIVACAINE-EPINEPHRINE (PF) 0.5% -1:200000 IJ SOLN
INTRAMUSCULAR | Status: AC
  Filled 2016-03-08: qty 30

## 2016-03-08 MED ORDER — EPHEDRINE SULFATE 50 MG/ML IJ SOLN
INTRAMUSCULAR | Status: AC
  Filled 2016-03-08: qty 1

## 2016-03-08 MED ORDER — BUPIVACAINE HCL (PF) 0.5 % IJ SOLN
INTRAMUSCULAR | Status: DC | PRN
  Administered 2016-03-08: 2.5 mL

## 2016-03-08 MED ORDER — FAMOTIDINE 20 MG PO TABS: 20.0000 mg | ORAL_TABLET | Freq: Every day | ORAL | Status: DC | PRN

## 2016-03-08 MED ORDER — ONDANSETRON HCL 4 MG/2ML IJ SOLN
INTRAMUSCULAR | Status: DC | PRN
  Administered 2016-03-08: 4 mg via INTRAVENOUS

## 2016-03-08 MED ORDER — ACETAMINOPHEN 325 MG PO TABS
650.0000 mg | ORAL_TABLET | Freq: Four times a day (QID) | ORAL | Status: DC | PRN
  Administered 2016-03-08: 650 mg via ORAL

## 2016-03-08 MED ORDER — PROMETHAZINE HCL 25 MG/ML IJ SOLN: 6.2500 mg | INTRAMUSCULAR | Status: DC | PRN

## 2016-03-08 MED ORDER — OXYCODONE-ACETAMINOPHEN 5-325 MG PO TABS: 1.0000 | ORAL_TABLET | ORAL | Status: DC | PRN

## 2016-03-08 MED ORDER — PROPOFOL 10 MG/ML IV BOLUS
INTRAVENOUS | Status: DC | PRN
  Administered 2016-03-08 (×3): 20 mg via INTRAVENOUS
  Administered 2016-03-08: 25 mg via INTRAVENOUS

## 2016-03-08 MED ORDER — FLEET ENEMA 7-19 GM/118ML RE ENEM: 1.0000 | ENEMA | Freq: Once | RECTAL | Status: DC | PRN

## 2016-03-08 MED ORDER — LIDOCAINE HCL (CARDIAC) 20 MG/ML IV SOLN
INTRAVENOUS | Status: AC
  Filled 2016-03-08: qty 5

## 2016-03-08 MED ORDER — LACTATED RINGERS IV SOLN
INTRAVENOUS | Status: DC | PRN
  Administered 2016-03-08 (×3): via INTRAVENOUS

## 2016-03-08 MED ORDER — ATORVASTATIN CALCIUM 10 MG PO TABS
10.0000 mg | ORAL_TABLET | Freq: Every day | ORAL | Status: DC
  Administered 2016-03-08 – 2016-03-09 (×2): 10 mg via ORAL
  Filled 2016-03-08 (×2): qty 1

## 2016-03-08 MED ORDER — FAMOTIDINE 20 MG PO TABS: 20.0000 mg | ORAL_TABLET | Freq: Every day | ORAL | Status: DC

## 2016-03-08 MED ORDER — OXYCODONE HCL 5 MG PO TABS
5.0000 mg | ORAL_TABLET | ORAL | Status: DC | PRN
  Administered 2016-03-08 – 2016-03-09 (×5): 10 mg via ORAL
  Filled 2016-03-08 (×4): qty 2

## 2016-03-08 MED ORDER — MIDAZOLAM HCL 2 MG/2ML IJ SOLN
INTRAMUSCULAR | Status: DC | PRN
  Administered 2016-03-08 (×2): 1 mg via INTRAVENOUS

## 2016-03-08 MED ORDER — FENTANYL CITRATE (PF) 250 MCG/5ML IJ SOLN
INTRAMUSCULAR | Status: AC
  Filled 2016-03-08: qty 5

## 2016-03-08 MED ORDER — EPHEDRINE SULFATE 50 MG/ML IJ SOLN
INTRAMUSCULAR | Status: DC | PRN
  Administered 2016-03-08 (×2): 5 mg via INTRAVENOUS

## 2016-03-08 MED ORDER — MEPERIDINE HCL 25 MG/ML IJ SOLN: 6.2500 mg | INTRAMUSCULAR | Status: DC | PRN

## 2016-03-08 MED ORDER — TRANEXAMIC ACID 1000 MG/10ML IV SOLN
1000.0000 mg | Freq: Once | INTRAVENOUS | Status: AC
  Administered 2016-03-08: 1000 mg via INTRAVENOUS
  Filled 2016-03-08: qty 10

## 2016-03-08 MED ORDER — DEXAMETHASONE SODIUM PHOSPHATE 10 MG/ML IJ SOLN
10.0000 mg | Freq: Once | INTRAMUSCULAR | Status: AC
  Administered 2016-03-09: 10 mg via INTRAVENOUS
  Filled 2016-03-08: qty 1

## 2016-03-08 MED ORDER — TRANEXAMIC ACID 1000 MG/10ML IV SOLN
1000.0000 mg | INTRAVENOUS | Status: AC
  Administered 2016-03-08: 1000 mg via INTRAVENOUS
  Filled 2016-03-08: qty 10

## 2016-03-08 MED ORDER — ONDANSETRON HCL 4 MG/2ML IJ SOLN
INTRAMUSCULAR | Status: AC
  Filled 2016-03-08: qty 2

## 2016-03-08 MED ORDER — PHENYLEPHRINE 40 MCG/ML (10ML) SYRINGE FOR IV PUSH (FOR BLOOD PRESSURE SUPPORT)
PREFILLED_SYRINGE | INTRAVENOUS | Status: AC
  Filled 2016-03-08: qty 10

## 2016-03-08 MED ORDER — ALUMINUM HYDROXIDE GEL 320 MG/5ML PO SUSP
15.0000 mL | ORAL | Status: DC | PRN
Start: 2016-03-08 — End: 2016-03-10
  Filled 2016-03-08: qty 30

## 2016-03-08 MED ORDER — ATROPINE SULFATE 0.1 MG/ML IJ SOLN
INTRAMUSCULAR | Status: AC
  Filled 2016-03-08: qty 10

## 2016-03-08 MED ORDER — OXYCODONE HCL 5 MG PO TABS
ORAL_TABLET | ORAL | Status: AC
  Administered 2016-03-08: 10 mg via ORAL
  Filled 2016-03-08: qty 2

## 2016-03-08 MED ORDER — METOCLOPRAMIDE HCL 5 MG/ML IJ SOLN
5.0000 mg | Freq: Three times a day (TID) | INTRAMUSCULAR | Status: DC | PRN
  Administered 2016-03-08 – 2016-03-09 (×2): 10 mg via INTRAVENOUS
  Filled 2016-03-08 (×2): qty 2

## 2016-03-08 MED ORDER — ACETAMINOPHEN 325 MG PO TABS
ORAL_TABLET | ORAL | Status: AC
Start: 2016-03-08 — End: 2016-03-08
  Administered 2016-03-08: 650 mg via ORAL
  Filled 2016-03-08: qty 2

## 2016-03-08 MED ORDER — CHLORHEXIDINE GLUCONATE 4 % EX LIQD: 60.0000 mL | Freq: Once | CUTANEOUS | Status: DC

## 2016-03-08 MED ORDER — GLYCOPYRROLATE 0.2 MG/ML IJ SOLN
INTRAMUSCULAR | Status: AC
  Filled 2016-03-08: qty 1

## 2016-03-08 MED ORDER — LEVOTHYROXINE SODIUM 50 MCG PO TABS
50.0000 ug | ORAL_TABLET | Freq: Every day | ORAL | Status: DC
  Administered 2016-03-09 – 2016-03-10 (×2): 50 ug via ORAL
  Filled 2016-03-08 (×2): qty 1

## 2016-03-08 MED ORDER — METHOCARBAMOL 500 MG PO TABS
500.0000 mg | ORAL_TABLET | Freq: Four times a day (QID) | ORAL | Status: DC | PRN
  Administered 2016-03-08 – 2016-03-09 (×2): 500 mg via ORAL
  Filled 2016-03-08 (×2): qty 1

## 2016-03-08 MED ORDER — MIDAZOLAM HCL 2 MG/2ML IJ SOLN
INTRAMUSCULAR | Status: AC
  Filled 2016-03-08: qty 2

## 2016-03-08 MED ORDER — KCL IN DEXTROSE-NACL 20-5-0.45 MEQ/L-%-% IV SOLN
INTRAVENOUS | Status: DC
  Administered 2016-03-08: 21:00:00 via INTRAVENOUS
  Filled 2016-03-08 (×2): qty 1000

## 2016-03-08 MED ORDER — LIDOCAINE HCL (CARDIAC) 20 MG/ML IV SOLN
INTRAVENOUS | Status: DC | PRN
  Administered 2016-03-08: 50 mg via INTRATRACHEAL

## 2016-03-08 MED ORDER — LACTATED RINGERS IV SOLN: INTRAVENOUS | Status: DC

## 2016-03-08 MED ORDER — METOCLOPRAMIDE HCL 5 MG PO TABS
5.0000 mg | ORAL_TABLET | Freq: Three times a day (TID) | ORAL | Status: DC | PRN
  Administered 2016-03-10: 10 mg via ORAL
  Filled 2016-03-08: qty 2

## 2016-03-08 SURGICAL SUPPLY — 52 items
BLADE SAW SGTL 18X1.27X75 (BLADE) ×2 IMPLANT
CAPT HIP TOTAL 2 ×1 IMPLANT
COVER SURGICAL LIGHT HANDLE (MISCELLANEOUS) ×3 IMPLANT
DECANTER SPIKE VIAL GLASS SM (MISCELLANEOUS) ×2 IMPLANT
DRAPE ORTHO SPLIT 77X108 STRL (DRAPES) ×2
DRAPE PROXIMA HALF (DRAPES) ×2 IMPLANT
DRAPE SURG ORHT 6 SPLT 77X108 (DRAPES) ×1 IMPLANT
DRILL BIT 7/64X5 (BIT) ×2 IMPLANT
DRSG AQUACEL AG ADV 3.5X10 (GAUZE/BANDAGES/DRESSINGS) ×2 IMPLANT
DRSG AQUACEL AG ADV 3.5X14 (GAUZE/BANDAGES/DRESSINGS) IMPLANT
DURAPREP 26ML APPLICATOR (WOUND CARE) ×2 IMPLANT
ELECT BLADE 4.0 EZ CLEAN MEGAD (MISCELLANEOUS)
ELECT REM PT RETURN 9FT ADLT (ELECTROSURGICAL) ×2
ELECTRODE BLDE 4.0 EZ CLN MEGD (MISCELLANEOUS) IMPLANT
ELECTRODE REM PT RTRN 9FT ADLT (ELECTROSURGICAL) ×1 IMPLANT
GLOVE BIO SURGEON STRL SZ7 (GLOVE) ×2 IMPLANT
GLOVE BIO SURGEON STRL SZ7.5 (GLOVE) ×3 IMPLANT
GLOVE BIO SURGEON STRL SZ8.5 (GLOVE) ×4 IMPLANT
GLOVE BIOGEL PI IND STRL 8 (GLOVE) ×2 IMPLANT
GLOVE BIOGEL PI IND STRL 9 (GLOVE) ×1 IMPLANT
GLOVE BIOGEL PI INDICATOR 8 (GLOVE) ×2
GLOVE BIOGEL PI INDICATOR 9 (GLOVE) ×1
GLOVE SURG ORTHO 7.0 STRL STRW (GLOVE) ×1 IMPLANT
GOWN STRL REUS W/ TWL LRG LVL3 (GOWN DISPOSABLE) ×2 IMPLANT
GOWN STRL REUS W/ TWL XL LVL3 (GOWN DISPOSABLE) ×2 IMPLANT
GOWN STRL REUS W/TWL LRG LVL3 (GOWN DISPOSABLE) ×4
GOWN STRL REUS W/TWL XL LVL3 (GOWN DISPOSABLE) ×4
HOOD PEEL AWAY FACE SHEILD DIS (HOOD) ×5 IMPLANT
KIT BASIN OR (CUSTOM PROCEDURE TRAY) ×2 IMPLANT
KIT ROOM TURNOVER OR (KITS) ×2 IMPLANT
MANIFOLD NEPTUNE II (INSTRUMENTS) ×2 IMPLANT
NEEDLE 22X1 1/2 (OR ONLY) (NEEDLE) ×2 IMPLANT
NS IRRIG 1000ML POUR BTL (IV SOLUTION) ×2 IMPLANT
PACK TOTAL JOINT (CUSTOM PROCEDURE TRAY) ×2 IMPLANT
PAD ARMBOARD 7.5X6 YLW CONV (MISCELLANEOUS) ×4 IMPLANT
PASSER SUT SWANSON 36MM LOOP (INSTRUMENTS) ×2 IMPLANT
SUT ETHIBOND 2 V 37 (SUTURE) ×2 IMPLANT
SUT VIC AB 0 CTX 36 (SUTURE) ×2
SUT VIC AB 0 CTX36XBRD ANTBCTR (SUTURE) ×1 IMPLANT
SUT VIC AB 1 CTX 36 (SUTURE) ×2
SUT VIC AB 1 CTX36XBRD ANBCTR (SUTURE) ×1 IMPLANT
SUT VIC AB 2-0 CTX 27 (SUTURE) ×2 IMPLANT
SUT VIC AB 3-0 CT1 27 (SUTURE) ×2
SUT VIC AB 3-0 CT1 TAPERPNT 27 (SUTURE) ×1 IMPLANT
SUT VIC AB 3-0 X1 27 (SUTURE) ×1 IMPLANT
SYR CONTROL 10ML LL (SYRINGE) ×2 IMPLANT
TOWEL OR 17X24 6PK STRL BLUE (TOWEL DISPOSABLE) ×2 IMPLANT
TOWEL OR 17X26 10 PK STRL BLUE (TOWEL DISPOSABLE) ×2 IMPLANT
TRAY CATH 16FR W/PLASTIC CATH (SET/KITS/TRAYS/PACK) ×1 IMPLANT
TUBE CONNECTING 12X1/4 (SUCTIONS) ×1 IMPLANT
WATER STERILE IRR 1000ML POUR (IV SOLUTION) ×8 IMPLANT
YANKAUER SUCT BULB TIP NO VENT (SUCTIONS) ×1 IMPLANT

## 2016-03-08 NOTE — Transfer of Care (Signed)
Immediate Anesthesia Transfer of Care Note  Patient: Maria Barker  Procedure(s) Performed: Procedure(s): TOTAL HIP ARTHROPLASTY (Left)  Patient Location: PACU  Anesthesia Type:Spinal  Level of Consciousness: awake, alert , oriented and patient cooperative  Airway & Oxygen Therapy: Patient Spontanous Breathing and Patient connected to nasal cannula oxygen  Post-op Assessment: Report given to RN, Post -op Vital signs reviewed and stable and Patient able to stick tongue midline  Post vital signs: Reviewed and stable  Last Vitals:  Filed Vitals:   03/08/16 0821  BP: 138/85  Pulse: 72  Temp: 36.5 C  Resp: 20    Complications: No apparent anesthesia complications

## 2016-03-08 NOTE — Op Note (Signed)
PATIENT ID:      Maria Barker  MRN:     [id number] DOB/AGE:    March 19, 1961 / 55 y.o.       OPERATIVE REPORT    DATE OF PROCEDURE:  03/08/2016       PREOPERATIVE DIAGNOSIS:  LEFT HIP OSTEOARTHRITIS                                                       Estimated body mass index is 23.82 kg/(m^2) as calculated from the following:   Height as of 02/27/16: 4\' 11"  (1.499 m).   Weight as of this encounter: 53.524 kg (118 lb).     POSTOPERATIVE DIAGNOSIS:  LEFT HIP OSTEOARTHRITIS                                                           PROCEDURE:  L total hip arthroplasty using a 50 mm DePuy Pinnacle  Cup, [employer], 10-degree polyethylene liner index superior  and posterior, a +3 32 mm ceramic head, a 16x11x150x36 SROM Stem, 16Bsm Sleeve  SURGEON: Etheline Geppert J    ASSISTANT:   Eric K. Barton Dubois  (present throughout entire procedure and necessary for timely completion of the procedure)  ANESTHESIA: Spinal  BLOOD LOSS: 200 FLUID REPLACEMENT: 1600 crystalloid Tranexamic Acid: 1gm iv 2gm topical DRAINS: None COMPLICATIONS: None    INDICATIONS FOR PROCEDURE:Patient with DDH, end-stage arthritis of the L hip.  X-rays show bone-on-bone arthritic changes, peri chondral cyts. Despite conservative measures with observation, anti-inflammatory medicine, narcotics, use of a cane, has severe unremitting pain and can ambulate only 6 blocks before resting.  Patient desires elective right total hip arthroplasty to decrease pain and increase function. The risks, benefits, and alternatives were discussed at length including but not limited to the risks of infection, bleeding, nerve injury, stiffness, blood clots, the need for revision surgery, cardiopulmonary complications, among others, and they were willing to proceed.Benefits have been discussed. Questions answered.     PROCEDURE IN DETAIL: The patient was identified by armband,  received preoperative IV antibiotics in the holding area at [facility], taken to the operating room , appropriate anesthetic monitors  were attached and general endotracheal anesthesia induced. Foley catheter was inserted. Patient was rolled into the R lateral decubitus position and fixed there with a Stulberg Mark II pelvic clamp and the L lower extremity was then prepped and draped  in the usual sterile fashion from the ankle to the hemipelvis. A time-out  procedure was performed. The skin along the lateral hip and thigh  infiltrated with 10 mL of 0.5% Marcaine and epinephrine solution. We  then made a posterolateral approach to the hip. With a #10 blade, 14 cm  incision through skin and subcutaneous tissue down to the level of the  IT band. Small bleeders were identified and cauterized. IT band cut in  line with skin incision exposing the greater trochanter. A Cobra retractor was placed between the gluteus minimus and the superior hip joint capsule, and a spiked Cobra between the quadratus femoris and the inferior hip joint capsule. This isolated the short  external rotators and  piriformis tendons. These were tagged with a #2 Ethibond  suture and cut off their insertion on the intertrochanteric crest. The posterior  capsule was then developed into an acetabular-based flap from Posterior Superior off of the acetabulum out over the femoral neck and back posterior inferior to the acetabular rim. This flap was tagged with two #2 Ethibond sutures and retracted protecting the sciatic nerve. This exposed the arthritic femoral head and osteophytes. The hip was then flexed and internally rotated, dislocating the femoral head and a standard neck cut performed 1 fingerbreadth above the lesser trochanter.  A spiked Cobra was placed in the cotyloid notch and a Hohmann retractor was then used to lever the femur anteriorly off of the anterior pelvic column. A posterior-inferior wing retractor was placed at the junction of the acetabulum and the ischium completing the  acetabular exposure.We then removed the peripheral osteophytes and labrum from the acetabulum. We then reamed the acetabulum up to 49 mm with basket reamers obtaining good coverage in all quadrants, irrigated out with normal  saline solution and hammered into place a 50 mm pinnacle cup in 45  degrees of abduction and about 20 degrees of anteversion. More  peripheral osteophytes removed, the apex hole eliminator was placed, and a 10-degree liner placed with the  IndexPS. The hip was then flexed and internally rotated exposing the  proximal femur, which was entered with the box cutting chisel, the initiating reamer followed by axial reaming up to 11.74mm full depth, and 12 partial depth. We then conically reamed up to 16B. And milled the calcar to 16Bsm. We then placed the trial sleeve, and a trial stem. A trial reduction was then  performed with a +0  36-mm ball on the standard neck and  excellent stability was noted with at 90 of flexion with 80 of  internal rotation and then full extension withexternal rotation. The hip  could not be dislocated in full extension. The knee could easily flex  to about 140 degrees. We also stretched the abductors at this point,  because of the preexisting adductor contractures. All trial components  were then removed. The real sleeve was then hammered into place, through the sleeve we reamed with a 11.5 reamer to ensure that the stem did not become incarcerated. The stem itself was then inserted in o in relation to the calcar.  At this point, a + 3 32-mm ceramic head was  hammered on the stem. The hip was reduced. We checked our stability  one more time and found to be excellent. The wound was once again  thoroughly irrigated out with normal saline solution pulse lavage. The  capsular flap and short external rotators were repaired back to the  intertrochanteric crest through drill holes with a #2 Ethibond suture.  The IT band was closed with running 1 Vicryl suture.  The subcutaneous  tissue with 0 and 2-0 undyed Vicryl suture and the skin with running  3-0 Vivryl SQ suture. Aquacil dessing was applied. The patient was then unclamped, rolled supine, awaken extubated and taken to recovery room without difficulty in stable condition.   Masen Luallen J 03/08/2016, 11:32 AM

## 2016-03-08 NOTE — Anesthesia Postprocedure Evaluation (Signed)
Anesthesia Post Note  Patient: Maria Barker  Procedure(s) Performed: Procedure(s) (LRB): TOTAL HIP ARTHROPLASTY (Left)  Patient location during evaluation: PACU Anesthesia Type: Spinal Level of consciousness: awake and alert Pain management: pain level controlled Vital Signs Assessment: post-procedure vital signs reviewed and stable Respiratory status: spontaneous breathing, nonlabored ventilation, respiratory function stable and patient connected to nasal cannula oxygen Cardiovascular status: blood pressure returned to baseline and stable Postop Assessment: spinal receding Anesthetic complications: no    Last Vitals:  Filed Vitals:   03/08/16 1345 03/08/16 1400  BP: 148/85 148/75  Pulse: 64 61  Temp: 36.7 C 36.6 C  Resp: 12 18    Last Pain:  Filed Vitals:   03/08/16 1408  PainSc: Asleep                 Montez Hageman

## 2016-03-08 NOTE — Evaluation (Signed)
Physical Therapy Evaluation Patient Details Name: Maria Barker MRN: [id number] DOB: [birth year] Today's Date: 03/08/2016   History of Present Illness  Pt is a 55 y/o F s/p Lt THA.  Pt's PMH includes Rt THA, pernicious anemia, depression.   Clinical Impression  Pt is s/p Lt THA resulting in the deficits listed below (see PT Problem List). Maria Barker was Ind PTA, using can prn due to pain.  She currently requires min assist for all mobility.  She is planning to return home at d/c w/ her son and daughter. Session limited by +emesis and lethargy. Pt will benefit from skilled PT to increase their independence and safety with mobility to allow discharge to the venue listed below.     Follow Up Recommendations Home health PT;Supervision for mobility/OOB    Equipment Recommendations  None recommended by PT    Recommendations for Other Services OT consult     Precautions / Restrictions Precautions Precautions: Fall;Posterior Hip Precaution Booklet Issued: Yes (comment) Precaution Comments: Per op note "posterolateral approach".  No specific hip precaution orders but educated pt on posterior total hip precautions and provided handout. Restrictions Weight Bearing Restrictions: Yes LLE Weight Bearing: Weight bearing as tolerated      Mobility  Bed Mobility Overal bed mobility: Needs Assistance Bed Mobility: Supine to Sit     Supine to sit: Min assist;HOB elevated     General bed mobility comments: Increased time and pt requires min assist and verbal cues to adhere to precautions and to elevate trunk  Transfers Overall transfer level: Needs assistance Equipment used: Rolling walker (2 wheeled) Transfers: Sit to/from [facility] Sit to Stand: Min assist Stand pivot transfers: Min assist       General transfer comment: Cues for hand placement.  Pt slow to stand and requires min assist to manage RW to [facility] and to chair.  Ambulation/Gait             General  Gait Details: deferred for pt/therapist safety as pt very lethargic maintaining eyes closed throughout most of session  Stairs            Wheelchair Mobility    Modified Rankin (Stroke Patients Only)       Balance Overall balance assessment: Needs assistance Sitting-balance support: Bilateral upper extremity supported;Feet supported Sitting balance-Leahy Scale: Fair Sitting balance - Comments: Close min guard assist for safety   Standing balance support: Bilateral upper extremity supported;During functional activity Standing balance-Leahy Scale: Poor Standing balance comment: Relies on support from RW                             Pertinent Vitals/Pain Pain Assessment: 0-10 Pain Score: 7  Pain Location: Lt hip Pain Descriptors / Indicators: Aching;Discomfort;Grimacing Pain Intervention(s): Limited activity within patient's tolerance;Monitored during session;Repositioned;Premedicated before session;Ice applied    Home Living Family/patient expects to be discharged to:: Private residence Living Arrangements: Children Available Help at Discharge: Family;Available 24 hours/day (daughter and son) Type of Home: House Home Access: Stairs to enter Entrance Stairs-Rails: None Entrance Stairs-Number of Steps: 1 Home Layout: One level Home Equipment: Environmental consultant - 2 wheels;Bedside commode;Cane - single point      Prior Function Level of Independence: Independent with assistive device(s)         Comments: Would use cane at times due to pain.  Ind w/ all ADLs     Hand Dominance        Extremity/Trunk Assessment   Upper  Extremity Assessment: Defer to OT evaluation           Lower Extremity Assessment: LLE deficits/detail   LLE Deficits / Details: limited ROM and strength as expected s/p Lt THA  Cervical / Trunk Assessment: Normal  Communication   Communication: No difficulties  Cognition Arousal/Alertness: Lethargic;Suspect due to  medications Behavior During Therapy: Flat affect Overall Cognitive Status: Within Functional Limits for tasks assessed                      General Comments General comments (skin integrity, edema, etc.): +emesis sitting EOB    Exercises General Exercises - Lower Extremity Ankle Circles/Pumps: AROM;Both;10 reps;Supine Quad Sets: Strengthening;Both;10 reps;Supine Long Arc Quad: AROM;Left;5 reps;Seated      Assessment/Plan    PT Assessment Patient needs continued PT services  PT Diagnosis Difficulty walking;Abnormality of gait;Acute pain   PT Problem List Decreased strength;Decreased range of motion;Decreased activity tolerance;Decreased balance;Decreased mobility;Decreased knowledge of use of DME;Decreased safety awareness;Decreased knowledge of precautions;Pain  PT Treatment Interventions DME instruction;Gait training;Stair training;Functional mobility training;Therapeutic activities;Therapeutic exercise;Balance training;Neuromuscular re-education;Patient/family education;Modalities   PT Goals (Current goals can be found in the Care Plan section) Acute Rehab PT Goals Patient Stated Goal: to feel better PT Goal Formulation: With patient/family Time For Goal Achievement: 03/15/16 Potential to Achieve Goals: Good    Frequency 7X/week   Barriers to discharge Inaccessible home environment 1 step to enter home    Co-evaluation               End of Session Equipment Utilized During Treatment: Gait belt Activity Tolerance: Treatment limited secondary to medical complications (Comment);Patient limited by lethargy (+emesis) Patient left: in chair;with call bell/phone within reach;with chair alarm set;with family/visitor present Nurse Communication: Mobility status;Other (comment);Precautions;Weight bearing status (+emesis)         Time: [id number] PT Time Calculation (min) (ACUTE ONLY): 28 min   Charges:   PT Evaluation $PT Eval Moderate Complexity: 1  Procedure PT Treatments $Therapeutic Exercise: 8-22 mins   PT G Codes:       Collie Siad PT, DPT  Pager: [phone] Phone: [phone] 03/08/2016, 4:18 PM

## 2016-03-08 NOTE — Anesthesia Procedure Notes (Signed)
Spinal  Patient location during procedure: OR Staffing Anesthesiologist: Chigozie Basaldua Performed by: anesthesiologist  Preanesthetic Checklist Completed: patient identified, site marked, surgical consent, pre-op evaluation, timeout performed, IV checked, risks and benefits discussed and monitors and equipment checked Spinal Block Patient position: sitting Prep: Betadine Patient monitoring: heart rate, continuous pulse ox and blood pressure Approach: right paramedian Location: L3-4 Injection technique: single-shot Needle Needle type: Sprotte  Needle gauge: 24 G Needle length: 9 cm Additional Notes Expiration date of kit checked and confirmed. Patient tolerated procedure well, without complications.     

## 2016-03-08 NOTE — Discharge Instructions (Addendum)
INSTRUCTIONS AFTER JOINT REPLACEMENT  ° °o Remove items at home which could result in a fall. This includes throw rugs or furniture in walking pathways °o ICE to the affected joint every three hours while awake for 30 minutes at a time, for at least the first 3-5 days, and then as needed for pain and swelling.  Continue to use ice for pain and swelling. You may notice swelling that will progress down to the foot and ankle.  This is normal after surgery.  Elevate your leg when you are not up walking on it.   °o Continue to use the breathing machine you got in the hospital (incentive spirometer) which will help keep your temperature down.  It is common for your temperature to cycle up and down following surgery, especially at night when you are not up moving around and exerting yourself.  The breathing machine keeps your lungs expanded and your temperature down. ° ° °DIET:  As you were doing prior to hospitalization, we recommend a well-balanced diet. ° °DRESSING / WOUND CARE / SHOWERING ° °Keep the surgical dressing until follow up.  The dressing is water proof, so you can shower without any extra covering.  IF THE DRESSING FALLS OFF or the wound gets wet inside, change the dressing with sterile gauze.  Please use good hand washing techniques before changing the dressing.  Do not use any lotions or creams on the incision until instructed by your surgeon.   ° °ACTIVITY ° °o Increase activity slowly as tolerated, but follow the weight bearing instructions below.   °o No driving for 6 weeks or until further direction given by your physician.  You cannot drive while taking narcotics.  °o No lifting or carrying greater than 10 lbs. until further directed by your surgeon. °o Avoid periods of inactivity such as sitting longer than an hour when not asleep. This helps prevent blood clots.  °o You may return to work once you are authorized by your doctor.  ° ° ° °WEIGHT BEARING  ° °Weight bearing as tolerated with assist  device (walker, cane, etc) as directed, use it as long as suggested by your surgeon or therapist, typically at least 4-6 weeks. ° ° °EXERCISES ° °Results after joint replacement surgery are often greatly improved when you follow the exercise, range of motion and muscle strengthening exercises prescribed by your doctor. Safety measures are also important to protect the joint from further injury. Any time any of these exercises cause you to have increased pain or swelling, decrease what you are doing until you are comfortable again and then slowly increase them. If you have problems or questions, call your caregiver or physical therapist for advice.  ° °Rehabilitation is important following a joint replacement. After just a few days of immobilization, the muscles of the leg can become weakened and shrink (atrophy).  These exercises are designed to build up the tone and strength of the thigh and leg muscles and to improve motion. Often times heat used for twenty to thirty minutes before working out will loosen up your tissues and help with improving the range of motion but do not use heat for the first two weeks following surgery (sometimes heat can increase post-operative swelling).  ° °These exercises can be done on a training (exercise) mat, on the floor, on a table or on a bed. Use whatever works the best and is most comfortable for you.    Use music or television while you are exercising so that   the exercises are a pleasant break in your day. This will make your life better with the exercises acting as a break in your routine that you can look forward to.   Perform all exercises about fifteen times, three times per day or as directed.  You should exercise both the operative leg and the other leg as well. ° °Exercises include: °  °• Quad Sets - Tighten up the muscle on the front of the thigh (Quad) and hold for 5-10 seconds.   °• Straight Leg Raises - With your knee straight (if you were given a brace, keep it on),  lift the leg to 60 degrees, hold for 3 seconds, and slowly lower the leg.  Perform this exercise against resistance later as your leg gets stronger.  °• Leg Slides: Lying on your back, slowly slide your foot toward your buttocks, bending your knee up off the floor (only go as far as is comfortable). Then slowly slide your foot back down until your leg is flat on the floor again.  °• Angel Wings: Lying on your back spread your legs to the side as far apart as you can without causing discomfort.  °• Hamstring Strength:  Lying on your back, push your heel against the floor with your leg straight by tightening up the muscles of your buttocks.  Repeat, but this time bend your knee to a comfortable angle, and push your heel against the floor.  You may put a pillow under the heel to make it more comfortable if necessary.  ° °A rehabilitation program following joint replacement surgery can speed recovery and prevent re-injury in the future due to weakened muscles. Contact your doctor or a physical therapist for more information on knee rehabilitation.  ° ° °CONSTIPATION ° °Constipation is defined medically as fewer than three stools per week and severe constipation as less than one stool per week.  Even if you have a regular bowel pattern at home, your normal regimen is likely to be disrupted due to multiple reasons following surgery.  Combination of anesthesia, postoperative narcotics, change in appetite and fluid intake all can affect your bowels.  ° °YOU MUST use at least one of the following options; they are listed in order of increasing strength to get the job done.  They are all available over the counter, and you may need to use some, POSSIBLY even all of these options:   ° °Drink plenty of fluids (prune juice may be helpful) and high fiber foods °Colace 100 mg by mouth twice a day  °Senokot for constipation as directed and as needed Dulcolax (bisacodyl), take with full glass of water  °Miralax (polyethylene glycol)  once or twice a day as needed. ° °If you have tried all these things and are unable to have a bowel movement in the first 3-4 days after surgery call either your surgeon or your primary doctor.   ° °If you experience loose stools or diarrhea, hold the medications until you stool forms back up.  If your symptoms do not get better within 1 week or if they get worse, check with your doctor.  If you experience "the worst abdominal pain ever" or develop nausea or vomiting, please contact the office immediately for further recommendations for treatment. ° ° °ITCHING:  If you experience itching with your medications, try taking only a single pain pill, or even half a pain pill at a time.  You can also use Benadryl over the counter for itching or also to   help with sleep.   TED HOSE STOCKINGS:  Use stockings on both legs until for at least 2 weeks or as directed by physician office. They may be removed at night for sleeping.  MEDICATIONS:  See your medication summary on the After Visit Summary that nursing will review with you.  You may have some home medications which will be placed on hold until you complete the course of blood thinner medication.  It is important for you to complete the blood thinner medication as prescribed.  PRECAUTIONS:  If you experience chest pain or shortness of breath - call 911 immediately for transfer to the hospital emergency department.   If you develop a fever greater that 101 F, purulent drainage from wound, increased redness or drainage from wound, foul odor from the wound/dressing, or calf pain - CONTACT YOUR SURGEON.                                                   FOLLOW-UP APPOINTMENTS:  If you do not already have a post-op appointment, please call the office for an appointment to be seen by your surgeon.  Guidelines for how soon to be seen are listed in your After Visit Summary, but are typically between 1-4 weeks after surgery.  OTHER INSTRUCTIONS:   Knee  Replacement:  Do not place pillow under knee, focus on keeping the knee straight while resting. CPM instructions: 0-90 degrees, 2 hours in the morning, 2 hours in the afternoon, and 2 hours in the evening. Place foam block, curve side up under heel at all times except when in CPM or when walking.  DO NOT modify, tear, cut, or change the foam block in any way.  MAKE SURE YOU:   Understand these instructions.   Get help right away if you are not doing well or get worse.    Thank you for letting us be a part of your medical care team.  It is a privilege we respect greatly.  We hope these instructions will help you stay on track for a fast and full recovery!   ----------------------------------------------------------------------------------------------------------------------------------  Information on my medicine - ELIQUIS (apixaban)  This medication education was reviewed with me or my healthcare representative as part of my discharge preparation.  The pharmacist that spoke with me during my hospital stay was:  Arty Baumgartner, [facility]  Why was Eliquis prescribed for you? Eliquis was prescribed for you to reduce the risk of blood clots forming after orthopedic surgery.    What do You need to know about Eliquis? Take your Eliquis TWICE DAILY - one tablet in the morning and one tablet in the evening with or without food.  It would be best to take the dose about the same time each day.  If you have difficulty swallowing the tablet whole please discuss with your pharmacist how to take the medication safely.  Take Eliquis exactly as prescribed by your doctor and DO NOT stop taking Eliquis without talking to the doctor who prescribed the medication.  Stopping without other medication to take the place of Eliquis may increase your risk of developing a clot.  After discharge, you should have regular check-up appointments with your healthcare provider that is prescribing your  Eliquis.  What do you do if you miss a dose? If a dose of ELIQUIS is not taken at the  scheduled time, take it as soon as possible on the same day and twice-daily administration should be resumed.  The dose should not be doubled to make up for a missed dose.  Do not take more than one tablet of ELIQUIS at the same time.  Important Safety Information A possible side effect of Eliquis is bleeding. You should call your healthcare provider right away if you experience any of the following: ? Bleeding from an injury or your nose that does not stop. ? Unusual colored urine (red or dark brown) or unusual colored stools (red or black). ? Unusual bruising for unknown reasons. ? A serious fall or if you hit your head (even if there is no bleeding).  Some medicines may interact with Eliquis and might increase your risk of bleeding or clotting while on Eliquis. To help avoid this, consult your healthcare provider or pharmacist prior to using any new prescription or non-prescription medications, including herbals, vitamins, non-steroidal anti-inflammatory drugs (NSAIDs) and supplements.  This website has more information on Eliquis (apixaban): http://www.eliquis.com/eliquis/home

## 2016-03-08 NOTE — Interval H&P Note (Signed)
History and Physical Interval Note:  03/08/2016 9:42 AM  Maria Barker  has presented today for surgery, with the diagnosis of LEFT HIP OSTEOARTHRITIS  The various methods of treatment have been discussed with the patient and family. After consideration of risks, benefits and other options for treatment, the patient has consented to  Procedure(s): TOTAL HIP ARTHROPLASTY (Left) as a surgical intervention .  The patient's history has been reviewed, patient examined, no change in status, stable for surgery.  I have reviewed the patient's chart and labs.  Questions were answered to the patient's satisfaction.     Kerin Salen

## 2016-03-08 NOTE — Progress Notes (Signed)
Report from R. Hunt, RN (PACU lunch relief)

## 2016-03-08 NOTE — Anesthesia Preprocedure Evaluation (Addendum)
Anesthesia Evaluation  Patient identified by MRN, date of birth, ID band Patient awake    Reviewed: Allergy & Precautions, NPO status , Patient's Chart, lab work & pertinent test results  Airway Mallampati: II  TM Distance: >3 FB Neck ROM: Full    Dental no notable dental hx.    Pulmonary neg pulmonary ROS,    Pulmonary exam normal breath sounds clear to auscultation       Cardiovascular negative cardio ROS Normal cardiovascular exam Rhythm:Regular Rate:Normal     Neuro/Psych negative neurological ROS  negative psych ROS   GI/Hepatic Neg liver ROS, GERD  Medicated and Controlled,  Endo/Other  Hypothyroidism   Renal/GU negative Renal ROS  negative genitourinary   Musculoskeletal negative musculoskeletal ROS (+)   Abdominal   Peds negative pediatric ROS (+)  Hematology negative hematology ROS (+)   Anesthesia Other Findings   Reproductive/Obstetrics negative OB ROS                             Anesthesia Physical Anesthesia Plan  ASA: II  Anesthesia Plan: Spinal   Post-op Pain Management:    Induction:   Airway Management Planned: Simple Face Mask  Additional Equipment:   Intra-op Plan:   Post-operative Plan:   Informed Consent: I have reviewed the patients History and Physical, chart, labs and discussed the procedure including the risks, benefits and alternatives for the proposed anesthesia with the patient or authorized representative who has indicated his/her understanding and acceptance.   Dental advisory given  Plan Discussed with: CRNA  Anesthesia Plan Comments:         Anesthesia Quick Evaluation

## 2016-03-09 ENCOUNTER — Encounter (HOSPITAL_COMMUNITY): Payer: Self-pay | Admitting: Orthopedic Surgery

## 2016-03-09 LAB — CBC
HEMATOCRIT: 29.5 % — AB (ref 36.0–46.0)
HEMOGLOBIN: 9.1 g/dL — AB (ref 12.0–15.0)
MCH: 26 pg (ref 26.0–34.0)
MCHC: 30.8 g/dL (ref 30.0–36.0)
MCV: 84.3 fL (ref 78.0–100.0)
Platelets: 282 10*3/uL (ref 150–400)
RBC: 3.5 MIL/uL — ABNORMAL LOW (ref 3.87–5.11)
RDW: 13.7 % (ref 11.5–15.5)
WBC: 10.2 10*3/uL (ref 4.0–10.5)

## 2016-03-09 LAB — BASIC METABOLIC PANEL
Anion gap: 9 (ref 5–15)
BUN: <5 mg/dL — ABNORMAL LOW (ref 6–20)
CALCIUM: 8.3 mg/dL — AB (ref 8.9–10.3)
CHLORIDE: 100 mmol/L — AB (ref 101–111)
CO2: 25 mmol/L (ref 22–32)
Creatinine, Ser: 0.61 mg/dL (ref 0.44–1.00)
GFR calc Af Amer: >60 mL/min (ref >60)
GFR calc non Af Amer: >60 mL/min (ref >60)
GLUCOSE: 110 mg/dL — AB (ref 65–99)
Potassium: 3.8 mmol/L (ref 3.5–5.1)
Sodium: 134 mmol/L — ABNORMAL LOW (ref 135–145)

## 2016-03-09 NOTE — Progress Notes (Signed)
Physical Therapy Treatment Patient Details Name: Maria Barker MRN: [id number] DOB: 1961-09-21 Today's Date: 03/09/2016    History of Present Illness Pt is a 55 y/o F s/p Lt THA.  Pt's PMH includes Rt THA, pernicious anemia, depression.     PT Comments    Patient remains limited with activity tolerance secondary to nausea and dizziness. BPs assessed, supine 109/72, sitting improved to 120/73 and standing 134/66. Ambulated in room with RW. Educated patient regarding posterior hip precautions and provided handout.  Will continue to see and progress as tolerated. Will attempt BID session.   Follow Up Recommendations  Home health PT;Supervision for mobility/OOB     Equipment Recommendations  None recommended by PT    Recommendations for Other Services       Precautions / Restrictions Precautions Precautions: Fall;Posterior Hip Precaution Booklet Issued: No Precaution Comments: Reviewed posterior hip precautions Restrictions Weight Bearing Restrictions: Yes LLE Weight Bearing: Weight bearing as tolerated    Mobility  Bed Mobility Overal bed mobility: Needs Assistance Bed Mobility: Supine to Sit;Sit to Supine     Supine to sit: Min assist;HOB elevated Sit to supine: Min assist;HOB elevated   General bed mobility comments: Min assist to progress LLE on and off bed. Verbal cues to adhere to precautions while exiting bed on L side.   Transfers Overall transfer level: Needs assistance Equipment used: Rolling walker (2 wheeled) Transfers: Sit to/from Stand Sit to Stand: Min guard;+2 safety/equipment         General transfer comment: Min +2 assist for safety due to pt complaints of increasing dizziness and low BP (see PT note for orthostatic BP)  Ambulation/Gait Ambulation/Gait assistance: Min assist Ambulation Distance (Feet): 30 Feet Assistive device: Rolling walker (2 wheeled) Gait Pattern/deviations: Step-to pattern;Decreased stride length;Antalgic Gait velocity:  decreased Gait velocity interpretation: Below normal speed for age/gender General Gait Details: limited distance secondary to patient dizziness and nausea   Stairs            Wheelchair Mobility    Modified Rankin (Stroke Patients Only)       Balance Overall balance assessment: Needs assistance Sitting-balance support: No upper extremity supported;Feet supported Sitting balance-Leahy Scale: Fair     Standing balance support: No upper extremity supported;During functional activity Standing balance-Leahy Scale: Poor                      Cognition Arousal/Alertness: Awake/alert Behavior During Therapy: Flat affect Overall Cognitive Status: Within Functional Limits for tasks assessed                      Exercises      General Comments        Pertinent Vitals/Pain Pain Assessment: 0-10 Pain Score: 5  Pain Location: L hip Pain Descriptors / Indicators: Aching;Sore Pain Intervention(s): Limited activity within patient's tolerance;Monitored during session;Repositioned    Home Living Family/patient expects to be discharged to:: Private residence Living Arrangements: Children (son and daughter) Available Help at Discharge: Family;Available 24 hours/day Type of Home: House Home Access: Stairs to enter Entrance Stairs-Rails: None Home Layout: One level Home Equipment: Environmental consultant - 2 wheels;Bedside commode;Cane - single point;Shower seat      Prior Function Level of Independence: Independent with assistive device(s)      Comments: Would use cane at times due to pain. Ind w/ all ADLs. Works as a Glass blower/designer   PT Goals (current goals can now be found in the care plan section) Acute Rehab PT Goals  Patient Stated Goal: to stop feeling dizzy PT Goal Formulation: With patient/family Time For Goal Achievement: 03/15/16 Potential to Achieve Goals: Good Progress towards PT goals: Progressing toward goals    Frequency  7X/week    PT Plan Current  plan remains appropriate    Co-evaluation PT/OT/SLP Co-Evaluation/Treatment: Yes Reason for Co-Treatment: For patient/therapist safety (called OT to dove tail due to pt dizziness and nausea) PT goals addressed during session: Mobility/safety with mobility OT goals addressed during session: ADL's and self-care;Proper use of Adaptive equipment and DME     End of Session Equipment Utilized During Treatment: Gait belt Activity Tolerance: Treatment limited secondary to medical complications (Comment) (dizziness and nausea) Patient left: in bed;with call bell/phone within reach;with family/visitor present     Time: [id number] PT Time Calculation (min) (ACUTE ONLY): 20 min  Charges:  $Gait Training: 8-22 mins                    G CodesDuncan Dull 03/30/2016, 2:49 PM Alben Deeds, [place] DPT  [phone]

## 2016-03-09 NOTE — Care Management (Signed)
Utilization review completed. Jozi Malachi, RN Case Manager [phone]. 

## 2016-03-09 NOTE — Care Management Note (Signed)
Case Management Note  Patient Details  Name: Alandrea Suleman MRN: [id number] Date of Birth: 1961/09/04  Subjective/Objective:     55 yr old female s/p left total hip arthroplasty.               Action/Plan: Case manager spoke with patient and her son concerning home health and DME needs at discharge. Patient was preoperatively setup with Chloride, no changes. Son states she has rolling walker and 3in1 from previous surgery. She will have family support at discharge.  Expected Discharge Date:     03/10/16             Expected Discharge Plan:  [place]  In-House Referral:     Discharge planning Services  CM Consult  Post Acute Care Choice:  Home Health Choice offered to:  Adult Children, Patient  DME Arranged:  N/A DME Agency:  NA  HH Arranged:  PT [place] Agency:  [facility]  Status of Service:  Completed, signed off  Medicare Important Message Given:    Date Medicare IM Given:    Medicare IM give by:    Date Additional Medicare IM Given:    Additional Medicare Important Message give by:     If discussed at [place] of Stay Meetings, dates discussed:    Additional Comments:  Ninfa Meeker, RN 03/09/2016, 10:48 AM

## 2016-03-09 NOTE — Progress Notes (Signed)
Physical Therapy Treatment Patient Details Name: Maria Barker MRN: [id number] DOB: April 05, 1961 Today's Date: 03/09/2016    History of Present Illness Pt is a 55 y/o F s/p Lt THA.  Pt's PMH includes Rt THA, pernicious anemia, depression.     PT Comments    Patient progressing well this pm, ambulated in hall without difficulty, performed transfers x3 from bed and commode, and performed stair negotiaton without difficulty.  Anticipate patient will be safe for d/c home.  Follow Up Recommendations  Home health PT;Supervision for mobility/OOB     Equipment Recommendations  None recommended by PT    Recommendations for Other Services OT consult     Precautions / Restrictions Precautions Precautions: Fall;Posterior Hip Precaution Booklet Issued: Yes (comment) Precaution Comments: Per op note "posterolateral approach".  No specific hip precaution orders but educated pt on posterior total hip precautions and provided handout. Restrictions Weight Bearing Restrictions: Yes LLE Weight Bearing: Weight bearing as tolerated    Mobility  Bed Mobility Overal bed mobility: Modified Independent             General bed mobility comments: no physical assist, increased time to perform  Transfers Overall transfer level: Needs assistance Equipment used: Rolling walker (2 wheeled) Transfers: Sit to/from Stand Sit to Stand: Supervision         General transfer comment: No physical assist needed, supervision for safety  Ambulation/Gait Ambulation/Gait assistance: Supervision Ambulation Distance (Feet): 180 Feet Assistive device: Rolling walker (2 wheeled) Gait Pattern/deviations: Step-through pattern;Decreased stride length;Trunk flexed;Antalgic Gait velocity: decreased   General Gait Details: steady with gait   Stairs Stairs: Yes Stairs assistance: Supervision Stair Management: Two rails;Step to pattern;Forwards Number of Stairs: 3 General stair comments: VCs for sequencing  and technique  Wheelchair Mobility    Modified Rankin (Stroke Patients Only)       Balance     Sitting balance-Leahy Scale: Fair     Standing balance support: No upper extremity supported Standing balance-Leahy Scale: Poor                      Cognition Arousal/Alertness: Lethargic;Suspect due to medications Behavior During Therapy: Flat affect Overall Cognitive Status: Within Functional Limits for tasks assessed                      Exercises      General Comments        Pertinent Vitals/Pain Pain Assessment: 0-10 Pain Score: 4  Pain Location: left hip Pain Descriptors / Indicators: Aching Pain Intervention(s): Monitored during session    Home Living                      Prior Function            PT Goals (current goals can now be found in the care plan section) Acute Rehab PT Goals Patient Stated Goal: to feel better PT Goal Formulation: With patient/family Time For Goal Achievement: 03/15/16 Potential to Achieve Goals: Good Progress towards PT goals: Progressing toward goals    Frequency  7X/week    PT Plan Current plan remains appropriate    Co-evaluation             End of Session Equipment Utilized During Treatment: Gait belt Activity Tolerance: Treatment limited secondary to medical complications (Comment);Patient limited by lethargy (+emesis) Patient left: in bed;with call bell/phone within reach;with family/visitor present     Time: [id number] PT Time Calculation (min) (ACUTE ONLY): 21 min  Charges:  $Gait Training: 8-22 mins                    G CodesDuncan Dull 03/24/2016, 6:06 PM Alben Deeds, [place] DPT  [phone]

## 2016-03-09 NOTE — Progress Notes (Signed)
Patient ID: Maria Barker, female   DOB: [birth year], 55 y.o.   MRN: [id number] PATIENT ID: Maria Barker  MRN: [id number]  DOB/AGE:  10/08/1961 / 55 y.o.  1 Day Post-Op Procedure(s) (LRB): TOTAL HIP ARTHROPLASTY (Left)    PROGRESS NOTE Subjective: Patient is alert, oriented, x1 Nausea, x1 Vomiting, yes passing gas, . Taking PO well. Denies SOB, Chest or Calf Pain. Using Incentive Spirometer, PAS in place. Ambulate WBAT Patient reports pain as  6/10  .    Objective: Vital signs in last 24 hours: Filed Vitals:   03/08/16 1400 03/08/16 2300 03/09/16 0048 03/09/16 0500  BP: 148/75 127/76 147/74 113/62  Pulse: 61 86 80 83  Temp: 97.9 F (36.6 C) 98.8 F (37.1 C) 98.8 F (37.1 C) 98.8 F (37.1 C)  TempSrc:      Resp: 18 18  18   Weight:      SpO2: 100% 98% 99% 97%      Intake/Output from previous day: I/O last 3 completed shifts: In: 2400 [I.V.:2400] Out: 101 [Urine:1; Blood:100]   Intake/Output this shift:     LABORATORY DATA:  Recent Labs  03/09/16 0516  WBC 10.2  HGB 9.1*  HCT 29.5*  PLT 282  NA 134*  K 3.8  CL 100*  CO2 25  BUN <5*  CREATININE 0.61  GLUCOSE 110*  CALCIUM 8.3*    Examination: Neurologically intact ABD soft Neurovascular intact Sensation intact distally Intact pulses distally Dorsiflexion/Plantar flexion intact Incision: dressing C/D/I No cellulitis present Compartment soft} XR AP&Lat of hip shows well placed\fixed THA  Assessment:   1 Day Post-Op Procedure(s) (LRB): TOTAL HIP ARTHROPLASTY (Left) ADDITIONAL DIAGNOSIS:  Expected Acute Blood Loss Anemia, anemia, hypothyroid  Plan: PT/OT WBAT, THA  DVT Prophylaxis: SCDx72 hrs, ASA 325 mg BID x 2 weeks  DISCHARGE PLAN: Home  DISCHARGE NEEDS: HHPT, Walker and 3-in-1 comode seat

## 2016-03-09 NOTE — Progress Notes (Signed)
Occupational Therapy Evaluation Patient Details Name: Maria Barker MRN: [id number] DOB: [birth year] Today's Date: 03/09/2016    History of Present Illness Pt is a 55 y/o F s/p Lt THA.  Pt's PMH includes Rt THA, pernicious anemia, depression.    Clinical Impression   PTA, pt was independent with ADLs and used [place] for mobility occasionally. Pt currently requires min guard assist for functional transfers and ambulation and mod assist for LB ADLs. Began education on posterior hip precautions and compensatory strategies for LB ADLs. Pt plans to d/c home with 24/7 assistance from her 2 children. Pt will benefit from continued acute OT to increase independence and safety with ADLs and mobility. No OT follow up or DME recommendations at this time.    Follow Up Recommendations  No OT follow up;Supervision/Assistance - 24 hour    Equipment Recommendations  Other (comment) (Adaptive equipment - pt to purchase if needed)    Recommendations for Other Services       Precautions / Restrictions Precautions Precautions: Fall;Posterior Hip Precaution Booklet Issued: No Precaution Comments: Reviewed posterior hip precautions Restrictions Weight Bearing Restrictions: Yes LLE Weight Bearing: Weight bearing as tolerated      Mobility Bed Mobility Overal bed mobility: Needs Assistance Bed Mobility: Supine to Sit;Sit to Supine     Supine to sit: Min assist;HOB elevated Sit to supine: Min assist;HOB elevated   General bed mobility comments: Min assist to progress LLE on and off bed. Verbal cues to adhere to precautions while exiting bed on L side.   Transfers Overall transfer level: Needs assistance Equipment used: Rolling walker (2 wheeled) Transfers: Sit to/from Stand Sit to Stand: Min guard;+2 safety/equipment         General transfer comment: Min +2 assist for safety due to pt complaints of increasing dizziness and low BP (see PT note for orthostatic BP)    Balance Overall balance  assessment: Needs assistance Sitting-balance support: No upper extremity supported;Feet supported Sitting balance-Leahy Scale: Fair     Standing balance support: No upper extremity supported;During functional activity Standing balance-Leahy Scale: Poor                              ADL Overall ADL's : Needs assistance/impaired     Grooming: Wash/dry hands;Min guard;Standing           Upper Body Dressing : Set up;Sitting   Lower Body Dressing: Minimal assistance;Sit to/from stand Lower Body Dressing Details (indicate cue type and reason): Due to posterior precautions Toilet Transfer: Min guard;Ambulation;BSC;RW;Cueing for safety Toilet Transfer Details (indicate cue type and reason): Cues for safe hand placement Toileting- Clothing Manipulation and Hygiene: Min guard;Sit to/from stand       Functional mobility during ADLs: Min guard;Rolling walker General ADL Comments: Reviewed hip precautions and began education on compensatory strategies for ADLs and availability of AE.      Vision Vision Assessment?: No apparent visual deficits   Perception     Praxis      Pertinent Vitals/Pain Pain Assessment: 0-10 Pain Score: 5  Pain Location: L hip Pain Descriptors / Indicators: Aching;Sore Pain Intervention(s): Limited activity within patient's tolerance;Monitored during session;Repositioned     Hand Dominance Right   Extremity/Trunk Assessment Upper Extremity Assessment Upper Extremity Assessment: Overall WFL for tasks assessed   Lower Extremity Assessment Lower Extremity Assessment: LLE deficits/detail LLE Deficits / Details: Decreased ROM and strength as expected post op   Cervical / Trunk Assessment Cervical / Trunk  Assessment: Normal   Communication Communication Communication: No difficulties   Cognition Arousal/Alertness: Awake/alert Behavior During Therapy: Flat affect Overall Cognitive Status: Within Functional Limits for tasks assessed                      General Comments       Exercises       Shoulder Instructions      Home Living Family/patient expects to be discharged to:: Private residence Living Arrangements: Children (son and daughter) Available Help at Discharge: Family;Available 24 hours/day Type of Home: House Home Access: Stairs to enter [employer] of Steps: 1 Entrance Stairs-Rails: None Home Layout: One level     Bathroom Shower/Tub: Tub/shower unit;Curtain Shower/tub characteristics: Architectural technologist: Standard     Home Equipment: Environmental consultant - 2 wheels;Bedside commode;Cane - single point;Shower seat          Prior Functioning/Environment Level of Independence: Independent with assistive device(s)        Comments: Would use cane at times due to pain. Ind w/ all ADLs. Works as a Administrator, [employer] Diagnosis: Acute pain   OT Problem List: Decreased strength;Decreased range of motion;Decreased activity tolerance;Impaired balance (sitting and/or standing);Decreased coordination;Decreased safety awareness;Decreased knowledge of use of DME or AE;Decreased knowledge of precautions;Pain   OT Treatment/Interventions: Self-care/ADL training;Therapeutic exercise;Energy conservation;DME and/or AE instruction;Therapeutic activities;Patient/family education;Balance training    OT Goals(Current goals can be found in the care plan section) Acute Rehab OT Goals Patient Stated Goal: to stop feeling dizzy OT Goal Formulation: With patient Time For Goal Achievement: 03/23/16 Potential to Achieve Goals: Good ADL Goals Pt Will Perform Lower Body Bathing: with supervision;with adaptive equipment;sitting/lateral leans;sit to/from stand Pt Will Perform Lower Body Dressing: with supervision;with adaptive equipment;sitting/lateral leans;sit to/from stand Pt Will Transfer to Toilet: with supervision;ambulating;bedside commode (with BSC over toilet) Pt Will Perform Toileting - Clothing  Manipulation and hygiene: with supervision;sitting/lateral leans;sit to/from stand Pt Will Perform Tub/Shower Transfer: Tub transfer;with supervision;ambulating;shower seat;rolling walker  OT Frequency: Min 2X/week   Barriers to D/C:            Co-evaluation PT/OT/SLP Co-Evaluation/Treatment: Yes     OT goals addressed during session: ADL's and self-care;Proper use of Adaptive equipment and DME      End of Session Equipment Utilized During Treatment: Gait belt;Rolling walker Nurse Communication: Mobility status  Activity Tolerance: Treatment limited secondary to medical complications (Comment) (dizziness) Patient left: in bed;with call bell/phone within reach;with family/visitor present   Time: 1140-1159 OT Time Calculation (min): 19 min Charges:  OT General Charges $OT Visit: 1 Procedure OT Evaluation $OT Eval Moderate Complexity: 1 Procedure G-Codes:    Redmond Baseman, OTR/L [phone] 03/09/2016, 1:07 PM

## 2016-03-10 LAB — CBC
HCT: 28.1 % — ABNORMAL LOW (ref 36.0–46.0)
HEMOGLOBIN: 9.2 g/dL — AB (ref 12.0–15.0)
MCH: 27.1 pg (ref 26.0–34.0)
MCHC: 32.7 g/dL (ref 30.0–36.0)
MCV: 82.9 fL (ref 78.0–100.0)
Platelets: 281 10*3/uL (ref 150–400)
RBC: 3.39 MIL/uL — AB (ref 3.87–5.11)
RDW: 13.4 % (ref 11.5–15.5)
WBC: 19.7 10*3/uL — AB (ref 4.0–10.5)

## 2016-03-10 NOTE — Progress Notes (Signed)
Physical Therapy Treatment Patient Details Name: Maria Barker MRN: [id number] DOB: 11-17-1961 Today's Date: 03/10/2016    History of Present Illness Pt is a 55 y/o F s/p Lt THA.  Pt's PMH includes Rt THA, pernicious anemia, depression.     PT Comments    Patient mobilizing well, performed stairs, curb and verbally reviewed technique for car transfer. Good recall of precautions. At this time, feel patient is adequate for safe d/c home with family. Nsg aware  Follow Up Recommendations  Home health PT;Supervision for mobility/OOB     Equipment Recommendations  None recommended by PT    Recommendations for Other Services OT consult     Precautions / Restrictions Precautions Precautions: Fall;Posterior Hip Precaution Booklet Issued: Yes (comment) Precaution Comments: goo recall 3/3 precautions with no cues Restrictions Weight Bearing Restrictions: Yes LLE Weight Bearing: Weight bearing as tolerated    Mobility  Bed Mobility Overal bed mobility: Modified Independent             General bed mobility comments: no physical assist, increased time to perform  Transfers Overall transfer level: Modified independent Equipment used: Rolling walker (2 wheeled)             General transfer comment: no assist or cues required  Ambulation/Gait Ambulation/Gait assistance: Supervision Ambulation Distance (Feet): 200 Feet Assistive device: Rolling walker (2 wheeled) Gait Pattern/deviations: Step-through pattern;Decreased stride length;Trunk flexed;Antalgic Gait velocity: decreased   General Gait Details: steady with gait   Stairs Stairs: Yes Stairs assistance: Modified independent (Device/Increase time) Stair Management: Two rails;Step to pattern;Forwards Number of Stairs: 4 General stair comments: no physical assist or cues required. Performed curb training with initial cues. patient recpetive and performing well  Wheelchair Mobility    Modified Rankin (Stroke  Patients Only)       Balance     Sitting balance-Leahy Scale: Good       Standing balance-Leahy Scale: Fair                      Cognition Arousal/Alertness: Awake/alert Behavior During Therapy: WFL for tasks assessed/performed Overall Cognitive Status: Within Functional Limits for tasks assessed                      Exercises      General Comments        Pertinent Vitals/Pain Pain Assessment: Faces Faces Pain Scale: Hurts a little bit Pain Location: left hip Pain Descriptors / Indicators: Guarding Pain Intervention(s): Monitored during session    Home Living                      Prior Function            PT Goals (current goals can now be found in the care plan section) Acute Rehab PT Goals Patient Stated Goal: to feel better PT Goal Formulation: With patient/family Time For Goal Achievement: 03/15/16 Potential to Achieve Goals: Good Progress towards PT goals: Progressing toward goals    Frequency  7X/week    PT Plan Current plan remains appropriate    Co-evaluation             End of Session Equipment Utilized During Treatment: Gait belt Activity Tolerance: Patient tolerated treatment well;No increased pain Patient left:  (With OT in gym)     Time: [id number] PT Time Calculation (min) (ACUTE ONLY): 9 min  Charges:  $Gait Training: 8-22 mins  G CodesDuncan Dull 03-24-16, 8:37 AM Alben Deeds, PT DPT  [phone]

## 2016-03-10 NOTE — Progress Notes (Signed)
Occupational Therapy Treatment Patient Details Name: Maria Barker MRN: [id number] DOB: 22-Jul-1961 Today's Date: 03/10/2016    History of present illness Pt is a 54 y/o F s/p Lt THA.  Pt's PMH includes Rt THA, pernicious anemia, depression.    OT comments  Pt making good progress toward OT goals. Overall supervision for safety with toilet transfer, grooming in standing, and LB ADLs with use of AE. Educated on tub transfer technique with shower seat; pt able to return demo with min guard assist. Pt able to return demo use of AE for increased independence with LB ADLs. D/c plan remains appropriate. Feel pt is ready to d/c from OT standpoint but will continue to follow acutely.   Follow Up Recommendations  No OT follow up;Supervision/Assistance - 24 hour    Equipment Recommendations  None recommended by OT    Recommendations for Other Services      Precautions / Restrictions Precautions Precautions: Fall;Posterior Hip Precaution Booklet Issued: Yes (comment) Precaution Comments: Pt able to recall 3/3 hip precautions and maintain throughout functional activities. Restrictions Weight Bearing Restrictions: Yes LLE Weight Bearing: Weight bearing as tolerated       Mobility Bed Mobility Overal bed mobility: Modified Independent             General bed mobility comments: no physical assist, increased time to perform  Transfers Overall transfer level: Modified independent Equipment used: Rolling walker (2 wheeled)             General transfer comment: no assist or cues required    Balance Overall balance assessment: Needs assistance Sitting-balance support: Feet supported;No upper extremity supported Sitting balance-Leahy Scale: Good     Standing balance support: No upper extremity supported;During functional activity Standing balance-Leahy Scale: Fair Standing balance comment: able to stand at sink and wash hands without UE support                   ADL  Overall ADL's : Needs assistance/impaired     Grooming: Supervision/safety;Standing;Wash/dry hands         Lower Body Bathing Details (indicate cue type and reason): Educated on use of long handled sponge; pt able to verbalize understanding     Lower Body Dressing: Supervision/safety;Sit to/from stand Lower Body Dressing Details (indicate cue type and reason): Educated on use of reacher, long handled shoe horn, and sock aide; pt able to return demo use of sock aide and reacher. Educated on compensatory strategies for LB ADLs. Toilet Transfer: Supervision/safety;Ambulation;BSC;RW (BSC over toilet)   Toileting- Clothing Manipulation and Hygiene: Supervision/safety;Sit to/from stand Toileting - Clothing Manipulation Details (indicate cue type and reason): for toilet hygiene Tub/ Shower Transfer: Min guard;Ambulation;Shower Technical sales engineer Details (indicate cue type and reason): Educated pt on tub transfer technique; pt able to return demo with min guard assist for safety. Educated pt on need for close supervision during tub transfers; she verbalized understanding. Functional mobility during ADLs: Supervision/safety;Rolling walker General ADL Comments: Pt reports she has AE at home from prior THA; reviewed use per pt request. Reviewed hip precautions; pt able to maintain throughout session without cues.      Vision                     Perception     Praxis      Cognition   Behavior During Therapy: [facility] for tasks assessed/performed Overall Cognitive Status: Within Functional Limits for tasks assessed  Extremity/Trunk Assessment               Exercises     Shoulder Instructions       General Comments      Pertinent Vitals/ Pain       Pain Assessment: Faces Faces Pain Scale: Hurts a little bit Pain Location: L hip Pain Descriptors / Indicators: Sore Pain Intervention(s): Monitored during  session;Repositioned  Home Living                                          Prior Functioning/Environment              Frequency Min 2X/week     Progress Toward Goals  OT Goals(current goals can now be found in the care plan section)  Progress towards OT goals: Progressing toward goals  Acute Rehab OT Goals Patient Stated Goal: to feel better OT Goal Formulation: With patient  Plan Discharge plan remains appropriate    Co-evaluation                 End of Session Equipment Utilized During Treatment: Gait belt;Rolling walker   Activity Tolerance Patient tolerated treatment well   Patient Left in bed;with call bell/phone within reach;with family/visitor present   Nurse Communication          Time: [id number] OT Time Calculation (min): 13 min  Charges: OT General Charges $OT Visit: 1 Procedure OT Treatments $Self Care/Home Management : 8-22 mins  Binnie Kand M.S., OTR/L Pager: [phone]  03/10/2016, 9:29 AM

## 2016-03-10 NOTE — Discharge Summary (Signed)
Patient ID: Maria Barker MRN: [id number] DOB/AGE: [birth year] 55 y.o.  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 Arthritis     R THR 2011  . Hyperlipidemia LDL goal < 130   . Pain of left thigh   . Back pain   . Left hip pain   . Insomnia   . Frequent headaches   . Hypothyroid 02/06/2015    Dx 01/2015: TSH 5.4    Surgeries: Procedure(s): TOTAL HIP ARTHROPLASTY on 03/08/2016   Consultants:    Discharged Condition: Improved  Hospital Course: Maria Barker is an 55 y.o. female who was admitted 03/08/2016 for operative treatment ofPrimary osteoarthritis of left hip. Patient has severe unremitting pain that affects sleep, daily activities, and work/hobbies. After pre-op clearance the patient was taken to the operating room on 03/08/2016 and underwent  Procedure(s): TOTAL HIP ARTHROPLASTY.    Patient was given perioperative antibiotics: Anti-infectives    Start     Dose/Rate Route Frequency Ordered Stop   03/08/16 0930  ceFAZolin (ANCEF) IVPB 2 g/50 mL premix     2 g 100 mL/hr over 30 Minutes Intravenous To ShortStay Surgical 03/05/16 0815 03/08/16 1018       Patient was given sequential compression devices, early ambulation, and chemoprophylaxis to prevent DVT.  Patient benefited maximally from hospital stay and there were no complications.    Recent vital signs: Patient Vitals for the past 24 hrs:  BP Temp Temp src Pulse Resp SpO2  03/10/16 0500 110/67 mmHg 99.9 F (37.7 C) Oral 91 17 96 %  03/09/16 2100 113/62 mmHg 98.9 F (37.2 C) Oral 84 17 98 %  03/09/16 1500 111/66 mmHg 98.1 F (36.7 C) Oral 77 17 98 %  03/09/16 1129 110/67 mmHg 98.1 F (36.7 C) Oral 74 18 98 %     Recent laboratory studies:  Recent Labs  03/09/16 0516 03/10/16 0517   WBC 10.2 19.7*  HGB 9.1* 9.2*  HCT 29.5* 28.1*  PLT 282 281  NA 134*  --   K 3.8  --   CL 100*  --   CO2 25  --   BUN <5*  --   CREATININE 0.61  --   GLUCOSE 110*  --   CALCIUM 8.3*  --      Discharge Medications:     Medication List    STOP taking these medications        acetaminophen 650 MG CR tablet  Commonly known as:  TYLENOL     diclofenac 50 MG tablet  Commonly known as:  CATAFLAM      TAKE these medications        apixaban 2.5 MG Tabs tablet  Commonly known as:  ELIQUIS  Take 1 tablet (2.5 mg total) by mouth 2 (two) times daily.     atorvastatin 10 MG tablet  Commonly known as:  LIPITOR  TAKE ONE TABLET BY MOUTH ONCE DAILY     CALCIUM-VITAMIN D3 PO  Take 1 each by mouth 2 (two) times daily.     levothyroxine 50 MCG tablet  Commonly known as:  SYNTHROID, LEVOTHROID  Take 1 tablet (50 mcg total) by mouth daily before breakfast.     methocarbamol 500 MG tablet  Commonly known as:  ROBAXIN  Take 1 tablet (500 mg total)  by mouth 2 (two) times daily with a meal.     oxyCODONE-acetaminophen 5-325 MG tablet  Commonly known as:  ROXICET  Take 1 tablet by mouth every 4 (four) hours as needed.     ranitidine 150 MG tablet  Commonly known as:  ZANTAC  Take 1 tablet (150 mg total) by mouth 2 (two) times daily.     vitamin B-12 1000 MCG tablet  Commonly known as:  CYANOCOBALAMIN  Take 1,000 mcg by mouth daily.     zaleplon 5 MG capsule  Commonly known as:  SONATA  Take 1 capsule (5 mg total) by mouth at bedtime as needed for sleep.        Diagnostic Studies: Dg Chest 2 View  02/27/2016  CLINICAL DATA:  Pre-admission for left hip replacement. EXAM: CHEST  2 VIEW COMPARISON:  None. FINDINGS: The heart size and mediastinal contours are within normal limits. Both lungs are clear. No pleural effusion or pneumothorax. The visualized skeletal structures are unremarkable. IMPRESSION: No active cardiopulmonary disease. Electronically Signed   By: Lajean Manes  M.D.   On: 02/27/2016 13:17   Dg Pelvis Portable  03/08/2016  CLINICAL DATA:  Left total hip arthroplasty EXAM: PORTABLE PELVIS 1-2 VIEWS COMPARISON:  11/03/2015 bilateral hip radiographs FINDINGS: The patient is status post partially visualized interval left total hip arthroplasty, with well-positioned left acetabular and left proximal femoral prostheses. Status post previous right total hip arthroplasty, with no evidence of right hip hardware fracture or loosening. No evidence of hip dislocation on this single frontal view. No osseous fracture or suspicious focal osseous lesion. Expected gas within and surrounding the left hip joint. IMPRESSION: Satisfactory appearance status post interval left total hip arthroplasty. Electronically Signed   By: Ilona Sorrel M.D.   On: 03/08/2016 13:23    Disposition: 01-Home or Self Care      Discharge Instructions    Call MD / Call 911    Complete by:  As directed   If you experience chest pain or shortness of breath, CALL 911 and be transported to the hospital emergency room.  If you develope a fever above 101 F, pus (white drainage) or increased drainage or redness at the wound, or calf pain, call your surgeon's office.     Constipation Prevention    Complete by:  As directed   Drink plenty of fluids.  Prune juice may be helpful.  You may use a stool softener, such as Colace (over the counter) 100 mg twice a day.  Use MiraLax (over the counter) for constipation as needed.     Diet - low sodium heart healthy    Complete by:  As directed      Driving restrictions    Complete by:  As directed   No driving for 2 weeks     Follow the hip precautions as taught in Physical Therapy    Complete by:  As directed      Increase activity slowly as tolerated    Complete by:  As directed      Patient may shower    Complete by:  As directed   You may shower without a dressing once there is no drainage.  Do not wash over the wound.  If drainage remains, cover wound  with plastic wrap and then shower.           Follow-up Information    Follow up with Kerin Salen, MD In 2 weeks.   Specialty:  Orthopedic Surgery  Contact information:   [place] [address] [phone]       Follow up with [facility].   Why:  someone from [facility] will contact you concerning start date and time for therapy/   Contact information:   [address] [place] [address] [phone]        Signed: Theodosia Quay 03/10/2016, 7:54 AM

## 2016-03-10 NOTE — Progress Notes (Signed)
Discharge instructions given. Pt verbalized understanding and all questions were answered.  

## 2016-03-10 NOTE — Progress Notes (Signed)
PATIENT ID: Maria Barker  MRN: [id number]  DOB/AGE:  08-03-1961 / 55 y.o.  2 Days Post-Op Procedure(s) (LRB): TOTAL HIP ARTHROPLASTY (Left)    PROGRESS NOTE Subjective: Patient is alert, oriented, no Nausea, no Vomiting, yes passing gas, . Taking PO with small bites. Denies SOB, Chest or Calf Pain. Using Incentive Spirometer, PAS in place. Ambulate 180 ft with therapy Patient reports pain as  moderate .    Objective: Vital signs in last 24 hours: Filed Vitals:   03/09/16 1129 03/09/16 1500 03/09/16 2100 03/10/16 0500  BP: 110/67 111/66 113/62 110/67  Pulse: 74 77 84 91  Temp: 98.1 F (36.7 C) 98.1 F (36.7 C) 98.9 F (37.2 C) 99.9 F (37.7 C)  TempSrc: Oral Oral Oral Oral  Resp: 18 17 17 17   Weight:      SpO2: 98% 98% 98% 96%      Intake/Output from previous day: I/O last 3 completed shifts: In: 690 [P.O.:690] Out: 1 [Urine:1]   Intake/Output this shift:     LABORATORY DATA:  Recent Labs  03/09/16 0516 03/10/16 0517  WBC 10.2 19.7*  HGB 9.1* 9.2*  HCT 29.5* 28.1*  PLT 282 281  NA 134*  --   K 3.8  --   CL 100*  --   CO2 25  --   BUN <5*  --   CREATININE 0.61  --   GLUCOSE 110*  --   CALCIUM 8.3*  --     Examination: Neurologically intact Neurovascular intact Sensation intact distally Intact pulses distally Dorsiflexion/Plantar flexion intact Incision: dressing C/D/I and no drainage No cellulitis present Compartment soft} XR AP&Lat of hip shows well placed\fixed THA  Assessment:   2 Days Post-Op Procedure(s) (LRB): TOTAL HIP ARTHROPLASTY (Left) ADDITIONAL DIAGNOSIS:  Expected Acute Blood Loss Anemia, anemia, hypothyroid  Plan: PT/OT WBAT, posterior THA precautions  DVT Prophylaxis: SCDx72 hrs, Eliquis 2.5 BID x 2 weeks  DISCHARGE PLAN: Home, when pt meets therapy goals  DISCHARGE NEEDS: HHPT, Walker and 3-in-1 comode seat

## 2016-06-01 ENCOUNTER — Other Ambulatory Visit: Payer: Self-pay | Admitting: *Deleted

## 2016-06-01 MED ORDER — ATORVASTATIN CALCIUM 10 MG PO TABS: 10.0000 mg | ORAL_TABLET | Freq: Every day | ORAL | Status: DC

## 2016-08-04 ENCOUNTER — Ambulatory Visit: Payer: Managed Care, Other (non HMO) | Admitting: Internal Medicine

## 2016-08-13 ENCOUNTER — Ambulatory Visit (INDEPENDENT_AMBULATORY_CARE_PROVIDER_SITE_OTHER): Payer: Managed Care, Other (non HMO) | Admitting: Internal Medicine

## 2016-08-13 ENCOUNTER — Other Ambulatory Visit (INDEPENDENT_AMBULATORY_CARE_PROVIDER_SITE_OTHER): Payer: Managed Care, Other (non HMO)

## 2016-08-13 ENCOUNTER — Encounter: Payer: Self-pay | Admitting: Internal Medicine

## 2016-08-13 VITALS — BP 130/84 | HR 62 | Temp 97.8°F | Resp 16 | Ht 59.0 in | Wt 121.0 lb

## 2016-08-13 DIAGNOSIS — E538 Deficiency of other specified B group vitamins: Secondary | ICD-10-CM | POA: Diagnosis not present

## 2016-08-13 DIAGNOSIS — G4700 Insomnia, unspecified: Secondary | ICD-10-CM

## 2016-08-13 DIAGNOSIS — E785 Hyperlipidemia, unspecified: Secondary | ICD-10-CM

## 2016-08-13 DIAGNOSIS — Z9660 Presence of unspecified orthopedic joint implant: Secondary | ICD-10-CM

## 2016-08-13 DIAGNOSIS — Z96643 Presence of artificial hip joint, bilateral: Secondary | ICD-10-CM

## 2016-08-13 DIAGNOSIS — F5104 Psychophysiologic insomnia: Secondary | ICD-10-CM

## 2016-08-13 DIAGNOSIS — E038 Other specified hypothyroidism: Secondary | ICD-10-CM

## 2016-08-13 DIAGNOSIS — K219 Gastro-esophageal reflux disease without esophagitis: Secondary | ICD-10-CM

## 2016-08-13 LAB — COMPREHENSIVE METABOLIC PANEL
ALBUMIN: 4.3 g/dL (ref 3.5–5.2)
ALT: 17 U/L (ref 0–35)
AST: 24 U/L (ref 0–37)
Alkaline Phosphatase: 74 U/L (ref 39–117)
BILIRUBIN TOTAL: 0.5 mg/dL (ref 0.2–1.2)
BUN: 12 mg/dL (ref 6–23)
CALCIUM: 9.5 mg/dL (ref 8.4–10.5)
CO2: 26 meq/L (ref 19–32)
CREATININE: 0.62 mg/dL (ref 0.40–1.20)
Chloride: 102 mEq/L (ref 96–112)
GFR: 106.95 mL/min (ref >60.00)
Glucose, Bld: 90 mg/dL (ref 70–99)
Potassium: 4.1 mEq/L (ref 3.5–5.1)
SODIUM: 135 meq/L (ref 135–145)
Total Protein: 8.4 g/dL — ABNORMAL HIGH (ref 6.0–8.3)

## 2016-08-13 LAB — CBC WITH DIFFERENTIAL/PLATELET
BASOS PCT: 0.4 % (ref 0.0–3.0)
Basophils Absolute: 0 10*3/uL (ref 0.0–0.1)
EOS ABS: 1.6 10*3/uL — AB (ref 0.0–0.7)
HCT: 37.2 % (ref 36.0–46.0)
HEMOGLOBIN: 12.4 g/dL (ref 12.0–15.0)
LYMPHS ABS: 1.4 10*3/uL (ref 0.7–4.0)
Lymphocytes Relative: 21 % (ref 12.0–46.0)
MCHC: 33.4 g/dL (ref 30.0–36.0)
MCV: 79.5 fl (ref 78.0–100.0)
MONO ABS: 0.5 10*3/uL (ref 0.1–1.0)
Monocytes Relative: 7.2 % (ref 3.0–12.0)
NEUTROS ABS: 3.2 10*3/uL (ref 1.4–7.7)
NEUTROS PCT: 47.9 % (ref 43.0–77.0)
PLATELETS: 362 10*3/uL (ref 150.0–400.0)
RBC: 4.68 Mil/uL (ref 3.87–5.11)
RDW: 14.5 % (ref 11.5–15.5)
WBC: 6.7 10*3/uL (ref 4.0–10.5)

## 2016-08-13 LAB — VITAMIN B12

## 2016-08-13 LAB — LIPID PANEL
CHOL/HDL RATIO: 3
CHOLESTEROL: 149 mg/dL (ref 0–200)
HDL: 59.5 mg/dL (ref >39.00)
LDL Cholesterol: 76 mg/dL (ref 0–99)
NonHDL: 89.34
TRIGLYCERIDES: 65 mg/dL (ref 0.0–149.0)
VLDL: 13 mg/dL (ref 0.0–40.0)

## 2016-08-13 LAB — TSH: TSH: 0.51 u[IU]/mL (ref 0.35–4.50)

## 2016-08-13 NOTE — Assessment & Plan Note (Signed)
Check lipids, cmp Continue lipitor 20 mg daily

## 2016-08-13 NOTE — Progress Notes (Signed)
Pre visit review using our clinic review tool, if applicable. No additional management support is needed unless otherwise documented below in the visit note. 

## 2016-08-13 NOTE — Assessment & Plan Note (Signed)
Not taking any medication Sleep improved after starting a juice

## 2016-08-13 NOTE — Patient Instructions (Addendum)
  Test(s) ordered today. Your results will be released to MyChart (or called to you) after review, usually within 72hours after test completion. If any changes need to be made, you will be notified at that same time.  All other Health Maintenance issues reviewed.   All recommended immunizations and age-appropriate screenings are up-to-date or discussed.  No immunizations administered today.   Medications reviewed and updated.  No changes recommended at this time.   Please followup in one year   

## 2016-08-13 NOTE — Assessment & Plan Note (Signed)
Intermittent Takes zantac as needed Continue zantac prn

## 2016-08-13 NOTE — Assessment & Plan Note (Signed)
Continue B12 daily

## 2016-08-13 NOTE — Progress Notes (Signed)
Subjective:    Patient ID: Maria Barker, female    DOB: [birth year], 55 y.o.   MRN: [id number]  HPI The patient is here for follow up.  Anemia:  She had anemia after her total hip replacement in March.  She denies fatigue.    B12 deficiency:  She is taking her B12 daily.    GERD:  She has sharp pain on occasion and she takes the zantac as needed and it helps.  She denies daily GERD.    Chronic insomnia:  She is sleeping better and sleeping more than before.   He is drinking a juice that helps.  She is not taking any medication.    Hyperlipidemia: She is taking her medication daily. She is compliant with a low fat/cholesterol diet. She is exercising regularly. She denies myalgias.   Hypothyroidism:  She is taking her medication daily.  She denies any recent changes in energy or weight that are unexplained.    Medications and allergies reviewed with patient and updated if appropriate.  Patient Active Problem List   Diagnosis Date Noted  . B12 deficiency 08/13/2016  . S/P bilateral hip replacements 08/13/2016  . 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 Primary osteoarthritis of left hip 03/07/2016  . Cephalalgia 08/12/2015  . Family history of brain aneurysm 08/12/2015  . Gastroesophageal reflux disease without esophagitis 08/12/2015  . Hypothyroid 02/06/2015  . Depression   . Hyperlipidemia with target LDL less than 130   . Osteoarthritis, hip, bilateral 04/13/2013  . Chronic insomnia 04/13/2013    Current Outpatient Prescriptions on File Prior to Visit  Medication Sig Dispense Refill  . apixaban (ELIQUIS) 2.5 MG TABS tablet Take 1 tablet (2.5 mg total) by mouth 2 (two) times daily. 30 tablet 0  . atorvastatin (LIPITOR) 10 MG tablet Take 1 tablet (10 mg total) by mouth daily. 90 tablet 1  . Calcium Carbonate-Vitamin D (CALCIUM-VITAMIN D3 PO) Take 1 each by mouth 2 (two) times daily.    Marland Kitchen levothyroxine (SYNTHROID, LEVOTHROID) 50 MCG tablet Take 1  tablet (50 mcg total) by mouth daily before breakfast. 90 tablet 3  . ranitidine (ZANTAC) 150 MG tablet Take 1 tablet (150 mg total) by mouth 2 (two) times daily. (Patient taking differently: Take 150 mg by mouth daily as needed for heartburn. ) 60 tablet 11  . vitamin B-12 (CYANOCOBALAMIN) 1000 MCG tablet Take 1,000 mcg by mouth daily.     No current facility-administered medications on file prior to visit.     Past Medical History:  Diagnosis Date  . Arthritis    R THR 2011  . Back pain   . Frequent headaches   . Hyperlipidemia LDL goal < 130   . Hypothyroid 02/06/2015   Dx 01/2015: TSH 5.4  . Insomnia   . Left hip pain   . Pain of left thigh     Past Surgical History:  Procedure Laterality Date  . 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 TOTAL HIP ARTHROPLASTY Left 03/08/2016   Procedure: TOTAL HIP ARTHROPLASTY;  Surgeon: Frederik Pear, MD;  Location: [facility];  Service: Orthopedics;  Laterality: Left;  . TUBAL LIGATION    . UPPER GASTROINTESTINAL ENDOSCOPY      Social History   Social History  . Marital status: Married    Spouse name: N/A  . Number of children: 5  . Years of education: N/A   Occupational History  . machine operator [employer]   Social History  Main Topics  . Smoking status: Never Smoker  . Smokeless tobacco: Never Used  . Alcohol use No  . Drug use: No  . Sexual activity: Not Asked   Other Topics Concern  . None   Social History Narrative  . None    Family History  Problem Relation Age of Onset  . CVA Mother   . Hypertension Mother     Review of Systems  Constitutional: Negative for fatigue and fever.  Respiratory: Negative for cough, shortness of breath and wheezing.   Cardiovascular: Negative for chest pain, palpitations and leg swelling.  Neurological: Positive for headaches (on occasion). Negative for light-headedness.       Objective:   Vitals:   08/13/16 1056  BP: 130/84  Pulse: 62  Resp: 16  Temp: 97.8 F (36.6  C)   Filed Weights   08/13/16 1056  Weight: 121 lb (54.9 kg)   Body mass index is 24.44 kg/m.   Physical Exam    Constitutional: Appears well-developed and well-nourished. No distress.  HENT:  Head: Normocephalic and atraumatic.  Neck: Neck supple. No tracheal deviation present. No thyromegaly present.  Cardiovascular: Normal rate, regular rhythm and normal heart sounds.   No murmur heard. No carotid bruit  Pulmonary/Chest: Effort normal and breath sounds normal. No respiratory distress. No has no wheezes. No rales.  Musculoskeletal: No edema.  Lymphadenopathy: No cervical adenopathy.  Skin: Skin is warm and dry. Not diaphoretic.  Psychiatric: Normal mood and affect. Behavior is normal.     Assessment & Plan:    See Problem List for Assessment and Plan of chronic medical problems.   F/u in one year

## 2016-08-13 NOTE — Assessment & Plan Note (Signed)
Check tsh  Titrate med dose if needed  

## 2016-09-15 ENCOUNTER — Other Ambulatory Visit: Payer: Self-pay | Admitting: Internal Medicine

## 2016-10-04 ENCOUNTER — Other Ambulatory Visit: Payer: Self-pay | Admitting: Internal Medicine

## 2016-10-04 DIAGNOSIS — K219 Gastro-esophageal reflux disease without esophagitis: Secondary | ICD-10-CM

## 2017-01-03 ENCOUNTER — Other Ambulatory Visit: Payer: Self-pay | Admitting: Internal Medicine

## 2017-03-15 DIAGNOSIS — M1612 Unilateral primary osteoarthritis, left hip: Secondary | ICD-10-CM | POA: Diagnosis not present

## 2017-04-01 DIAGNOSIS — Z01419 Encounter for gynecological examination (general) (routine) without abnormal findings: Secondary | ICD-10-CM | POA: Diagnosis not present

## 2017-04-12 ENCOUNTER — Ambulatory Visit (INDEPENDENT_AMBULATORY_CARE_PROVIDER_SITE_OTHER): Payer: 59 | Admitting: Family

## 2017-04-12 ENCOUNTER — Encounter: Payer: Self-pay | Admitting: Family

## 2017-04-12 ENCOUNTER — Ambulatory Visit (INDEPENDENT_AMBULATORY_CARE_PROVIDER_SITE_OTHER)
Admission: RE | Admit: 2017-04-12 | Discharge: 2017-04-12 | Disposition: A | Discharge status: Home / Self Care | Payer: 59 | Source: Ambulatory Visit | Attending: Family | Admitting: Family

## 2017-04-12 VITALS — BP 144/84 | HR 58 | Temp 97.8°F | Resp 16 | Ht 59.0 in | Wt 127.4 lb

## 2017-04-12 DIAGNOSIS — G8929 Other chronic pain: Secondary | ICD-10-CM | POA: Insufficient documentation

## 2017-04-12 DIAGNOSIS — M25562 Pain in left knee: Secondary | ICD-10-CM

## 2017-04-12 DIAGNOSIS — M1712 Unilateral primary osteoarthritis, left knee: Secondary | ICD-10-CM | POA: Insufficient documentation

## 2017-04-12 MED ORDER — DICLOFENAC SODIUM 2 % TD SOLN: 1.0000 "application " | Freq: Two times a day (BID) | TRANSDERMAL | 1 refills | Status: DC | PRN

## 2017-04-12 MED ORDER — NAPROXEN-ESOMEPRAZOLE 500-20 MG PO TBEC: 1.0000 | DELAYED_RELEASE_TABLET | Freq: Two times a day (BID) | ORAL | 0 refills | Status: DC | PRN

## 2017-04-12 NOTE — Assessment & Plan Note (Signed)
This is a new problem with symptoms consistent with osteoarthritis given no significant trauma or injury. Obtain x-rays. Treat conservatively with ice, Pennsaid, and home exercise therapy. Follow-up pending x-ray results or if symptoms worsen or do not improve.

## 2017-04-12 NOTE — Patient Instructions (Signed)
Thank you for choosing [facility]!  Ice  x 20 minutes every 2 hours and as needed or following activity  Exercises 1-2 times per day as instructed.   You will receive a call to schedule your imaging, appointment or physical therapy.  Medications  Pennsaid - Approximately 1/2 packet to the affected site twice daily.  Vimovo - 1 tablet twice per day for the next 5-7 days and then as needed.  You will receive a call from a pharmacy regarding your Pennsaid/Duexis/Vimovo. The medication will be mailed to you and should cost you no more than $10 per item or possibly free depending upon your insurance.   Your prescription(s) have been submitted to your pharmacy or been printed and provided for you. Please take as directed and contact our office if you believe you are having problem(s) with the medication(s) or have any questions.  Imaging  Please stop by radiology on the basement level of the building for your x-rays. Your results will be released to [facility] (or called to you) after review, usually within 72 hours after test completion. If any treatments or changes are necessary, you will be notified at that same time.  If your symptoms worsen or fail to improve, please contact our office for further instruction, or in case of emergency go directly to the emergency room at the closest medical facility.    Osteoarthritis Osteoarthritis is a type of arthritis that affects tissue that covers the ends of bones in joints (cartilage). Cartilage acts as a cushion between the bones and helps them move smoothly. Osteoarthritis results when cartilage in the joints gets worn down. Osteoarthritis is sometimes called "wear and tear" arthritis. Osteoarthritis is the most common form of arthritis. It often occurs in older people. It is a condition that gets worse over time (a progressive condition). Joints that are most often affected by this condition are  in:  Fingers.  Toes.  Hips.  Knees.  Spine, including neck and lower back. What are the causes? This condition is caused by age-related wearing down of cartilage that covers the ends of bones. What increases the risk? The following factors may make you more likely to develop this condition:  Older age.  Being overweight or obese.  Overuse of joints, such as in athletes.  Past injury of a joint.  Past surgery on a joint.  Family history of osteoarthritis. What are the signs or symptoms? The main symptoms of this condition are pain, swelling, and stiffness in the joint. The joint may lose its shape over time. Small pieces of bone or cartilage may break off and float inside of the joint, which may cause more pain and damage to the joint. Small deposits of bone (osteophytes) may grow on the edges of the joint. Other symptoms may include:  A grating or scraping feeling inside the joint when you move it.  Popping or creaking sounds when you move. Symptoms may affect one or more joints. Osteoarthritis in a major joint, such as your knee or hip, can make it painful to walk or exercise. If you have osteoarthritis in your hands, you might not be able to grip items, twist your hand, or control small movements of your hands and fingers (fine motor skills). How is this diagnosed? This condition may be diagnosed based on:  Your medical history.  A physical exam.  Your symptoms.  X-rays of the affected joint(s).  Blood tests to rule out other types of arthritis. How is this treated? There is  no cure for this condition, but treatment can help to control pain and improve joint function. Treatment plans may include:  A prescribed exercise program that allows for rest and joint relief. You may work with a physical therapist.  A weight control plan.  Pain relief techniques, such as:  Applying heat and cold to the joint.  Electric pulses delivered to nerve endings under the skin  (transcutaneous electrical nerve stimulation, or TENS).  Massage.  Certain nutritional supplements.  NSAIDs or prescription medicines to help relieve pain.  Medicine to help relieve pain and inflammation (corticosteroids). This can be given by mouth (orally) or as an injection.  Assistive devices, such as a brace, wrap, splint, specialized glove, or cane.  Surgery, such as:  An osteotomy. This is done to reposition the bones and relieve pain or to remove loose pieces of bone and cartilage.  Joint replacement surgery. You may need this surgery if you have very bad (advanced) osteoarthritis. Follow these instructions at home: Activity   Rest your affected joints as directed by your health care provider.  Do not drive or use heavy machinery while taking prescription pain medicine.  Exercise as directed. Your health care provider or physical therapist may recommend specific types of exercise, such as:  Strengthening exercises. These are done to strengthen the muscles that support joints that are affected by arthritis. They can be performed with weights or with exercise bands to add resistance.  Aerobic activities. These are exercises, such as brisk walking or water aerobics, that get your heart pumping.  Range-of-motion activities. These keep your joints easy to move.  Balance and agility exercises. Managing pain, stiffness, and swelling   If directed, apply heat to the affected area as often as told by your health care provider. Use the heat source that your health care provider recommends, such as a moist heat pack or a heating pad.  If you have a removable assistive device, remove it as told by your health care provider.  Place a towel between your skin and the heat source. If your health care provider tells you to keep the assistive device on while you apply heat, place a towel between the assistive device and the heat source.  Leave the heat on for 20-30 minutes.  Remove  the heat if your skin turns bright red. This is especially important if you are unable to feel pain, heat, or cold. You may have a greater risk of getting burned.  If directed, put ice on the affected joint:  If you have a removable assistive device, remove it as told by your health care provider.  Put ice in a plastic bag.  Place a towel between your skin and the bag. If your health care provider tells you to keep the assistive device on during icing, place a towel between the assistive device and the bag.  Leave the ice on for 20 minutes, 2-3 times a day. General instructions   Take over-the-counter and prescription medicines only as told by your health care provider.  Maintain a healthy weight. Follow instructions from your health care provider for weight control. These may include dietary restrictions.  Do not use any products that contain nicotine or tobacco, such as cigarettes and e-cigarettes. These can delay bone healing. If you need help quitting, ask your health care provider.  Use assistive devices as directed by your health care provider.  Keep all follow-up visits as told by your health care provider. This is important. Where to find more  information:  [employer] of Arthritis and Musculoskeletal and Skin Diseases: www.niams.SouthExposed.es  [employer] on Aging: http://kim-miller.com/  American College of Rheumatology: www.rheumatology.org Contact a health care provider if:  Your skin turns red.  You develop a rash.  You have pain that gets worse.  You have a fever along with joint or muscle aches. Get help right away if:  You lose a lot of weight.  You suddenly lose your appetite.  You have night sweats. Summary  Osteoarthritis is a type of arthritis that affects tissue covering the ends of bones in joints (cartilage).  This condition is caused by age-related wearing down of cartilage that covers the ends of bones.  The main symptom of this condition  is pain, swelling, and stiffness in the joint.  There is no cure for this condition, but treatment can help to control pain and improve joint function. This information is not intended to replace advice given to you by your health care provider. Make sure you discuss any questions you have with your health care provider. Document Released: 12/13/2005 Document Revised: 08/16/2016 Document Reviewed: 08/16/2016 Elsevier Interactive Patient Education  2017 [employer].   Knee Exercises Ask your health care provider which exercises are safe for you. Do exercises exactly as told by your health care provider and adjust them as directed. It is normal to feel mild stretching, pulling, tightness, or discomfort as you do these exercises, but you should stop right away if you feel sudden pain or your pain gets worse.Do not begin these exercises until told by your health care provider. STRETCHING AND RANGE OF MOTION EXERCISES  These exercises warm up your muscles and joints and improve the movement and flexibility of your knee. These exercises also help to relieve pain, numbness, and tingling. Exercise A: Knee Extension, Prone  1. Lie on your abdomen on a bed. 2. Place your left / right knee just beyond the edge of the surface so your knee is not on the bed. You can put a towel under your left / right thigh just above your knee for comfort. 3. Relax your leg muscles and allow gravity to straighten your knee. You should feel a stretch behind your left / right knee. 4. Hold this position for __________ seconds. 5. Scoot up so your knee is supported between repetitions. Repeat __________ times. Complete this stretch __________ times a day. Exercise B: Knee Flexion, Active   1. Lie on your back with both knees straight. If this causes back discomfort, bend your left / right knee so your foot is flat on the floor. 2. Slowly slide your left / right heel back toward your buttocks until you feel a gentle stretch  in the front of your knee or thigh. 3. Hold this position for __________ seconds. 4. Slowly slide your left / right heel back to the starting position. Repeat __________ times. Complete this exercise __________ times a day. Exercise C: Quadriceps, Prone   1. Lie on your abdomen on a firm surface, such as a bed or padded floor. 2. Bend your left / right knee and hold your ankle. If you cannot reach your ankle or pant leg, loop a belt around your foot and grab the belt instead. 3. Gently pull your heel toward your buttocks. Your knee should not slide out to the side. You should feel a stretch in the front of your thigh and knee. 4. Hold this position for __________ seconds. Repeat __________ times. Complete this stretch __________ times a day.  Exercise D: Hamstring, Supine  1. Lie on your back. 2. Loop a belt or towel over the ball of your left / right foot. The ball of your foot is on the walking surface, right under your toes. 3. Straighten your left / right knee and slowly pull on the belt to raise your leg until you feel a gentle stretch behind your knee.  Do not let your left / right knee bend while you do this.  Keep your other leg flat on the floor. 4. Hold this position for __________ seconds. Repeat __________ times. Complete this stretch __________ times a day. STRENGTHENING EXERCISES  These exercises build strength and endurance in your knee. Endurance is the ability to use your muscles for a long time, even after they get tired. Exercise E: Quadriceps, Isometric   1. Lie on your back with your left / right leg extended and your other knee bent. Put a rolled towel or small pillow under your knee if told by your health care provider. 2. Slowly tense the muscles in the front of your left / right thigh. You should see your kneecap slide up toward your hip or see increased dimpling just above the knee. This motion will push the back of the knee toward the floor. 3. For __________  seconds, keep the muscle as tight as you can without increasing your pain. 4. Relax the muscles slowly and completely. Repeat __________ times. Complete this exercise __________ times a day. Exercise F: Straight Leg Raises - Quadriceps  1. Lie on your back with your left / right leg extended and your other knee bent. 2. Tense the muscles in the front of your left / right thigh. You should see your kneecap slide up or see increased dimpling just above the knee. Your thigh may even shake a bit. 3. Keep these muscles tight as you raise your leg 4-6 inches (10-15 cm) off the floor. Do not let your knee bend. 4. Hold this position for __________ seconds. 5. Keep these muscles tense as you lower your leg. 6. Relax your muscles slowly and completely after each repetition. Repeat __________ times. Complete this exercise __________ times a day. Exercise G: Hamstring, Isometric  1. Lie on your back on a firm surface. 2. Bend your left / right knee approximately __________ degrees. 3. Dig your left / right heel into the surface as if you are trying to pull it toward your buttocks. Tighten the muscles in the back of your thighs to dig as hard as you can without increasing any pain. 4. Hold this position for __________ seconds. 5. Release the tension gradually and allow your muscles to relax completely for __________ seconds after each repetition. Repeat __________ times. Complete this exercise __________ times a day. Exercise H: Hamstring Curls   If told by your health care provider, do this exercise while wearing ankle weights. Begin with __________ weights. Then increase the weight by 1 lb (0.5 kg) increments. Do not wear ankle weights that are more than __________. 1. Lie on your abdomen with your legs straight. 2. Bend your left / right knee as far as you can without feeling pain. Keep your hips flat against the floor. 3. Hold this position for __________ seconds. 4. Slowly lower your leg to the  starting position. Repeat __________ times. Complete this exercise __________ times a day. Exercise I: Squats (Quadriceps)  1. Stand in front of a table, with your feet and knees pointing straight ahead. You may rest your hands on  the table for balance but not for support. 2. Slowly bend your knees and lower your hips like you are going to sit in a chair.  Keep your weight over your heels, not over your toes.  Keep your lower legs upright so they are parallel with the table legs.  Do not let your hips go lower than your knees.  Do not bend lower than told by your health care provider.  If your knee pain increases, do not bend as low. 3. Hold the squat position for __________ seconds. 4. Slowly push with your legs to return to standing. Do not use your hands to pull yourself to standing. Repeat __________ times. Complete this exercise __________ times a day. Exercise J: Wall Slides (Quadriceps)   1. Lean your back against a smooth wall or door while you walk your feet out 18-24 inches (46-61 cm) from it. 2. Place your feet hip-width apart. 3. Slowly slide down the wall or door until your knees Repeat __________ times. Complete this exercise __________ times a day. 4. Exercise K: Straight Leg Raises - Hip Abductors  1. Lie on your side with your left / right leg in the top position. Lie so your head, shoulder, knee, and hip line up. You may bend your bottom knee to help you keep your balance. 2. Roll your hips slightly forward so your hips are stacked directly over each other and your left / right knee is facing forward. 3. Leading with your heel, lift your top leg 4-6 inches (10-15 cm). You should feel the muscles in your outer hip lifting.  Do not let your foot drift forward.  Do not let your knee roll toward the ceiling. 4. Hold this position for __________ seconds. 5. Slowly return your leg to the starting position. 6. Let your muscles relax completely after each  repetition. Repeat __________ times. Complete this exercise __________ times a day. Exercise L: Straight Leg Raises - Hip Extensors  1. Lie on your abdomen on a firm surface. You can put a pillow under your hips if that is more comfortable. 2. Tense the muscles in your buttocks and lift your left / right leg about 4-6 inches (10-15 cm). Keep your knee straight as you lift your leg. 3. Hold this position for __________ seconds. 4. Slowly lower your leg to the starting position. 5. Let your leg relax completely after each repetition. Repeat __________ times. Complete this exercise __________ times a day. This information is not intended to replace advice given to you by your health care provider. Make sure you discuss any questions you have with your health care provider. Document Released: 10/27/2005 Document Revised: 09/06/2016 Document Reviewed: 10/19/2015 Elsevier Interactive Patient Education  2017 [employer].

## 2017-04-12 NOTE — Progress Notes (Signed)
Subjective:    Patient ID: Maria Barker, female    DOB: 04/20/1961, 56 y.o.   MRN: [id number]  Chief Complaint  Patient presents with  . Knee Pain    left knee pain, started sunday night, can not put pressure on her knee    HPI:  Maria Barker is a 56 y.o. female who  has a past medical history of Arthritis; Back pain; Frequent headaches; Hyperlipidemia LDL goal < 130; Hypothyroid (02/06/2015); Insomnia; Left hip pain; and Pain of left thigh. and presents today for an acute office visit.  This is a new problem. Associated symptoms of pain located in her left knee that has been going on for about about for about 3 days. Pains are described as sharp, dull and achy. Denies any trauma or injury that she can recall. No previous history of knee injury. No sounds/sensation heard or felt. No locking or giving way. Severity is enough that she has to use a cane. Course of the symptoms has improved a little since initial onset. Modifying factors include Aleve which did help with some of her pain.   Allergies  Allergen Reactions  . Asa [Aspirin] Nausea And Vomiting    UPSET STOMACH      Outpatient Medications Prior to Visit  Medication Sig Dispense Refill  . atorvastatin (LIPITOR) 10 MG tablet TAKE ONE TABLET BY MOUTH ONCE DAILY 90 tablet 1  . Calcium Carbonate-Vitamin D (CALCIUM-VITAMIN D3 PO) Take 1 each by mouth 2 (two) times daily.    Marland Kitchen levothyroxine (SYNTHROID, LEVOTHROID) 50 MCG tablet Take 1 tablet (50 mcg total) by mouth daily before breakfast. 90 tablet 3  . ranitidine (ZANTAC) 150 MG tablet TAKE ONE TABLET BY MOUTH TWICE DAILY 60 tablet 3  . vitamin B-12 (CYANOCOBALAMIN) 1000 MCG tablet Take 1,000 mcg by mouth daily.     No facility-administered medications prior to visit.       Past Surgical History:  Procedure Laterality Date  . 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 TOTAL HIP ARTHROPLASTY Left 03/08/2016   Procedure: TOTAL HIP ARTHROPLASTY;  Surgeon: Frederik Pear, MD;   Location: [facility];  Service: Orthopedics;  Laterality: Left;  . TUBAL LIGATION    . UPPER GASTROINTESTINAL ENDOSCOPY        Past Medical History:  Diagnosis Date  . Arthritis    R THR 2011  . Back pain   . Frequent headaches   . Hyperlipidemia LDL goal < 130   . Hypothyroid 02/06/2015   Dx 01/2015: TSH 5.4  . Insomnia   . Left hip pain   . Pain of left thigh       Review of Systems  Constitutional: Negative for chills and fever.  Musculoskeletal:       Positive for left knee pain.  Neurological: Negative for weakness and numbness.      Objective:    BP (!) 144/84 (BP Location: Left Arm, Patient Position: Sitting, Cuff Size: Normal)   Pulse (!) 58   Temp 97.8 F (36.6 C) (Oral)   Resp 16   Ht 4\' 11"  (1.499 m)   Wt 127 lb 6.4 oz (57.8 kg)   SpO2 97%   BMI 25.73 kg/m  Nursing note and vital signs reviewed.  Physical Exam  Constitutional: She is oriented to person, place, and time. She appears well-developed and well-nourished. No distress.  Cardiovascular: Normal rate, regular rhythm, normal heart sounds and intact distal pulses.   Pulmonary/Chest: Effort normal and breath sounds normal.  Musculoskeletal:  Left knee - no obvious deformity or discoloration. Mild edema. No palpable tenderness, crepitus or deformity noted. Range of motion within normal limits. Strength is normal. Weightbearing is limited secondary to discomfort. Ligamentous and meniscal testing are negative. Distal pulses and sensation are intact and appropriate.  Neurological: She is alert and oriented to person, place, and time.  Skin: Skin is warm and dry.  Psychiatric: She has a normal mood and affect. Her behavior is normal. Judgment and thought content normal.       Assessment & Plan:   Problem List Items Addressed This Visit      Other   Acute pain of left knee - Primary    This is a new problem with symptoms consistent with osteoarthritis given no significant trauma or injury. Obtain  x-rays. Treat conservatively with ice, Pennsaid, and home exercise therapy. Follow-up pending x-ray results or if symptoms worsen or do not improve.      Relevant Medications   Diclofenac Sodium (PENNSAID) 2 % SOLN   Other Relevant Orders   DG Knee Complete 4 Views Left       I am having Ms. Amick start on Diclofenac Sodium and Naproxen-Esomeprazole. I am also having her maintain her Calcium Carbonate-Vitamin D (CALCIUM-VITAMIN D3 PO), vitamin B-12, levothyroxine, ranitidine, and atorvastatin.   Meds ordered this encounter  Medications  . Diclofenac Sodium (PENNSAID) 2 % SOLN    Sig: Place 1 application onto the skin 2 (two) times daily as needed.    Dispense:  112 g    Refill:  1    Order Specific Question:   Supervising Provider    Answer:   Pricilla Holm A [[id number]]  . Naproxen-Esomeprazole 500-20 MG TBEC    Sig: Take 1 tablet by mouth 2 (two) times daily as needed.    Dispense:  60 tablet    Refill:  0    Order Specific Question:   Supervising Provider    Answer:   Pricilla Holm A [[id number]     Follow-up: Return in about 3 weeks (around 05/03/2017), or if symptoms worsen or fail to improve.  Mauricio Po, FNP

## 2017-04-26 ENCOUNTER — Ambulatory Visit (INDEPENDENT_AMBULATORY_CARE_PROVIDER_SITE_OTHER): Payer: 59 | Admitting: Internal Medicine

## 2017-04-26 ENCOUNTER — Encounter: Payer: Self-pay | Admitting: Internal Medicine

## 2017-04-26 VITALS — BP 144/76 | HR 72 | Temp 97.9°F | Resp 16 | Wt 127.0 lb

## 2017-04-26 DIAGNOSIS — M25562 Pain in left knee: Secondary | ICD-10-CM

## 2017-04-26 DIAGNOSIS — G8929 Other chronic pain: Secondary | ICD-10-CM

## 2017-04-26 NOTE — Progress Notes (Signed)
Pre visit review using our clinic review tool, if applicable. No additional management support is needed unless otherwise documented below in the visit note. 

## 2017-04-26 NOTE — Patient Instructions (Signed)
  A referral was ordered for orthopedics

## 2017-04-26 NOTE — Assessment & Plan Note (Signed)
Chronic for months - she did tell our PA two weeks ago this pain was acute Xray reviewed  - minimal arthritis Pain is severe per patient, no improvement with nsaids Will refer to ortho for further evaluation

## 2017-04-26 NOTE — Progress Notes (Signed)
Subjective:    Patient ID: Maria Barker, female    DOB: Nov 17, 1961, 56 y.o.   MRN: [id number]  HPI She is here for an acute visit.   Left knee pain:  She has pain in her left knee and the pain radiates toward the foot and the hip. The pain is constant, but denies pain now.  The pain is worse in the cold weather.  When she gets up in the morning the pain is bad when she first starts walking.  Walking longer does not make it better.  Her whole leg cramps.  She denies lower back pain. Her left knee has a rubbing or clicking sensation if she walks fast.   She had her left hip replaced about one year ago.  This pain started about 4 months after her surgery.  The pain has progressively worsened.  She has difficulty walking.  She did see her our PA and he told her it was arthritis in her knee.  He prescribed Pennsaid and Vimovo.  These medicaions and the exercises have not helped.    She uses a cane to walk every morning -  She is not currently using one.     Medications and allergies reviewed with patient and updated if appropriate.  Patient Active Problem List   Diagnosis Date Noted  . Acute pain of left knee 04/12/2017  . B12 deficiency 08/13/2016  . S/P bilateral hip replacements 08/13/2016  . 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 Primary osteoarthritis of left hip 03/07/2016  . Cephalalgia 08/12/2015  . Family history of brain aneurysm 08/12/2015  . Gastroesophageal reflux disease without esophagitis 08/12/2015  . Hypothyroid 02/06/2015  . Depression   . Hyperlipidemia with target LDL less than 130   . Osteoarthritis, hip, bilateral 04/13/2013  . Chronic insomnia 04/13/2013    Current Outpatient Prescriptions on File Prior to Visit  Medication Sig Dispense Refill  . atorvastatin (LIPITOR) 10 MG tablet TAKE ONE TABLET BY MOUTH ONCE DAILY 90 tablet 1  . Calcium Carbonate-Vitamin D (CALCIUM-VITAMIN D3 PO) Take 1 each by mouth 2 (two) times daily.    Marland Kitchen  levothyroxine (SYNTHROID, LEVOTHROID) 50 MCG tablet Take 1 tablet (50 mcg total) by mouth daily before breakfast. 90 tablet 3  . Naproxen-Esomeprazole 500-20 MG TBEC Take 1 tablet by mouth 2 (two) times daily as needed. 60 tablet 0  . ranitidine (ZANTAC) 150 MG tablet TAKE ONE TABLET BY MOUTH TWICE DAILY 60 tablet 3  . vitamin B-12 (CYANOCOBALAMIN) 1000 MCG tablet Take 1,000 mcg by mouth daily.     No current facility-administered medications on file prior to visit.     Past Medical History:  Diagnosis Date  . Arthritis    R THR 2011  . Back pain   . Frequent headaches   . Hyperlipidemia LDL goal < 130   . Hypothyroid 02/06/2015   Dx 01/2015: TSH 5.4  . Insomnia   . Left hip pain   . Pain of left thigh     Past Surgical History:  Procedure Laterality Date  . 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 TOTAL HIP ARTHROPLASTY Left 03/08/2016   Procedure: TOTAL HIP ARTHROPLASTY;  Surgeon: Frederik Pear, MD;  Location: [facility];  Service: Orthopedics;  Laterality: Left;  . TUBAL LIGATION    . UPPER GASTROINTESTINAL ENDOSCOPY      Social History   Social History  . Marital status: Married    Spouse name: N/A  .  Number of children: 5  . Years of education: N/A   Occupational History  . 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 Smoking status: Never Smoker  . Smokeless tobacco: Never Used  . Alcohol use No  . Drug use: No  . Sexual activity: Not on file   Other Topics Concern  . Not on file   Social History Narrative  . No narrative on file    Family History  Problem Relation Age of Onset  . CVA Mother   . Hypertension Mother     Review of Systems  Constitutional: Negative for fever.  Musculoskeletal: Positive for arthralgias and gait problem (due to knee pain). Negative for back pain and joint swelling.  Skin: Negative for color change.  Neurological: Negative for weakness and numbness.       Objective:   Vitals:   04/26/17 0953    BP: (!) 144/76  Pulse: 72  Resp: 16  Temp: 97.9 F (36.6 C)   Filed Weights   04/26/17 0953  Weight: 127 lb (57.6 kg)   Body mass index is 25.65 kg/m.  Wt Readings from Last 3 Encounters:  04/26/17 127 lb (57.6 kg)  04/12/17 127 lb 6.4 oz (57.8 kg)  08/13/16 121 lb (54.9 kg)     Physical Exam A left knee exam was performed.   SWELLING: no EFFUSION: no WARMTH: no warmth  TENDERNESS: no tenderness throughout knee with palpation  ROM: full extension, full flexion  GAIT: walks with a mild limp  NEUROLOGICAL EXAM: normal sensation  CALF TENDERNESS: no    DG Knee Complete 4 Views Left CLINICAL DATA:  Generalized left knee pain.  Pain for 3 days.  EXAM: LEFT KNEE - COMPLETE 4+ VIEW  COMPARISON:  None.  FINDINGS: No evidence of fracture, dislocation, or joint effusion. Minimal medial femorotibial compartment joint space narrowing. Soft tissues are unremarkable.  IMPRESSION: No acute osseous injury of the left knee.  Minimal medial femorotibial compartment joint space narrowing.  Electronically Signed   By: Kathreen Devoid   On: 04/12/2017 15:03      Assessment & Plan:   See Problem List for Assessment and Plan of chronic medical problems.

## 2017-05-06 ENCOUNTER — Encounter (INDEPENDENT_AMBULATORY_CARE_PROVIDER_SITE_OTHER): Payer: Self-pay | Admitting: Orthopaedic Surgery

## 2017-05-06 ENCOUNTER — Ambulatory Visit (INDEPENDENT_AMBULATORY_CARE_PROVIDER_SITE_OTHER): Payer: 59 | Admitting: Orthopaedic Surgery

## 2017-05-06 DIAGNOSIS — M25562 Pain in left knee: Secondary | ICD-10-CM | POA: Diagnosis not present

## 2017-05-06 DIAGNOSIS — G8929 Other chronic pain: Secondary | ICD-10-CM

## 2017-05-06 MED ORDER — METHYLPREDNISOLONE ACETATE 40 MG/ML IJ SUSP
40.0000 mg | INTRAMUSCULAR | Status: AC | PRN
  Administered 2017-05-06: 40 mg via INTRA_ARTICULAR

## 2017-05-06 MED ORDER — LIDOCAINE HCL 1 % IJ SOLN
2.0000 mL | INTRAMUSCULAR | Status: AC | PRN
  Administered 2017-05-06: 2 mL

## 2017-05-06 MED ORDER — BUPIVACAINE HCL 0.5 % IJ SOLN
2.0000 mL | INTRAMUSCULAR | Status: AC | PRN
  Administered 2017-05-06: 2 mL via INTRA_ARTICULAR

## 2017-05-06 NOTE — Progress Notes (Signed)
Office Visit Note   Patient: Maria Barker           Date of Birth: 04-06-1961           MRN: [id number] Visit Date: 05/06/2017              Requested by: Binnie Rail, MD [place], [place] [address] PCP: Binnie Rail, MD   Assessment & Plan: Visit Diagnoses:  1. Chronic pain of left knee     Plan: Impression is left knee pain possibly arthritis flareup. Left knee injection was performed today. I offered physical therapy which she declined. She has GI upset from NSAIDs. I'll see her back as needed.  Follow-Up Instructions: Return if symptoms worsen or fail to improve.   Orders:  No orders of the defined types were placed in this encounter.  No orders of the defined types were placed in this encounter.     Procedures: Large Joint Inj Date/Time: 05/06/2017 10:37 AM Performed by: Leandrew Koyanagi Authorized by: Leandrew Koyanagi   Consent Given by:  Patient Timeout: prior to procedure the correct patient, procedure, and site was verified   Indications:  Pain Location:  Knee Site:  R knee Prep: patient was prepped and draped in usual sterile fashion   Needle Size:  22 G Ultrasound Guidance: No   Fluoroscopic Guidance: No   Arthrogram: No   Medications:  2 mL lidocaine 1 %; 2 mL bupivacaine 0.5 %; 40 mg methylPREDNISolone acetate 40 MG/ML Patient tolerance:  Patient tolerated the procedure well with no immediate complications     Clinical Data: No additional findings.   Subjective: Chief Complaint  Patient presents with  . Left Knee - Pain    Patient is a 56 year old female comes in with left knee pain that is constant 10/10 since April. She endorses pain and cramping. Naproxen and Tylenol do not help. She endorses pain at the superior aspect of the patella. She denies any swelling. The pain does radiate down the anterior aspect of the shin. Denies any mechanical symptoms. Denies any constitutional symptoms    Review of Systems  Constitutional:  Negative.   HENT: Negative.   Eyes: Negative.   Respiratory: Negative.   Cardiovascular: Negative.   Endocrine: Negative.   Musculoskeletal: Negative.   Neurological: Negative.   Hematological: Negative.   Psychiatric/Behavioral: Negative.   All other systems reviewed and are negative.    Objective: Vital Signs: There were no vitals taken for this visit.  Physical Exam  Constitutional: She is oriented to person, place, and time. She appears well-developed and well-nourished.  HENT:  Head: Normocephalic and atraumatic.  Eyes: EOM are normal.  Neck: Neck supple.  Pulmonary/Chest: Effort normal.  Abdominal: Soft.  Neurological: She is alert and oriented to person, place, and time.  Skin: Skin is warm. Capillary refill takes less than 2 seconds.  Psychiatric: She has a normal mood and affect. Her behavior is normal. Judgment and thought content normal.  Nursing note and vitals reviewed.   Ortho Exam Left knee exam shows no joint effusion. No joint line tenderness. Collaterals and cruciate's are stable. She is mainly pain that refers to the superior aspect of the patella. She has normal range of motion. Specialty Comments:  No specialty comments available.  Imaging: No results found.   PMFS History: Patient Active Problem List   Diagnosis Date Noted  . Chronic pain of left knee 04/12/2017  . B12 deficiency 08/13/2016  . S/P bilateral  hip replacements 08/13/2016  . 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 Primary osteoarthritis of left hip 03/07/2016  . Cephalalgia 08/12/2015  . Family history of brain aneurysm 08/12/2015  . Gastroesophageal reflux disease without esophagitis 08/12/2015  . Hypothyroid 02/06/2015  . Depression   . Hyperlipidemia with target LDL less than 130   . Osteoarthritis, hip, bilateral 04/13/2013  . Chronic insomnia 04/13/2013   Past Medical History:  Diagnosis Date  . Arthritis    R THR 2011  . Back pain   .  Frequent headaches   . Hyperlipidemia LDL goal < 130   . Hypothyroid 02/06/2015   Dx 01/2015: TSH 5.4  . Insomnia   . Left hip pain   . Pain of left thigh     Family History  Problem Relation Age of Onset  . CVA Mother   . Hypertension Mother     Past Surgical History:  Procedure Laterality Date  . 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 TOTAL HIP ARTHROPLASTY Left 03/08/2016   Procedure: TOTAL HIP ARTHROPLASTY;  Surgeon: Frederik Pear, MD;  Location: [facility];  Service: Orthopedics;  Laterality: Left;  . TUBAL LIGATION    . UPPER GASTROINTESTINAL ENDOSCOPY     Social History   Occupational History  . 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 Smoking status: Never Smoker  . Smokeless tobacco: Never Used  . Alcohol use No  . Drug use: No  . Sexual activity: Not on file

## 2017-05-10 IMAGING — CR DG PORTABLE PELVIS
1 series · 1 of 1 positions shown · non-contrast
Comparison: 11/03/2015 bilateral hip radiographs

CLINICAL DATA: Left total hip arthroplasty

EXAM:
PORTABLE PELVIS 1-2 VIEWS

[ap]
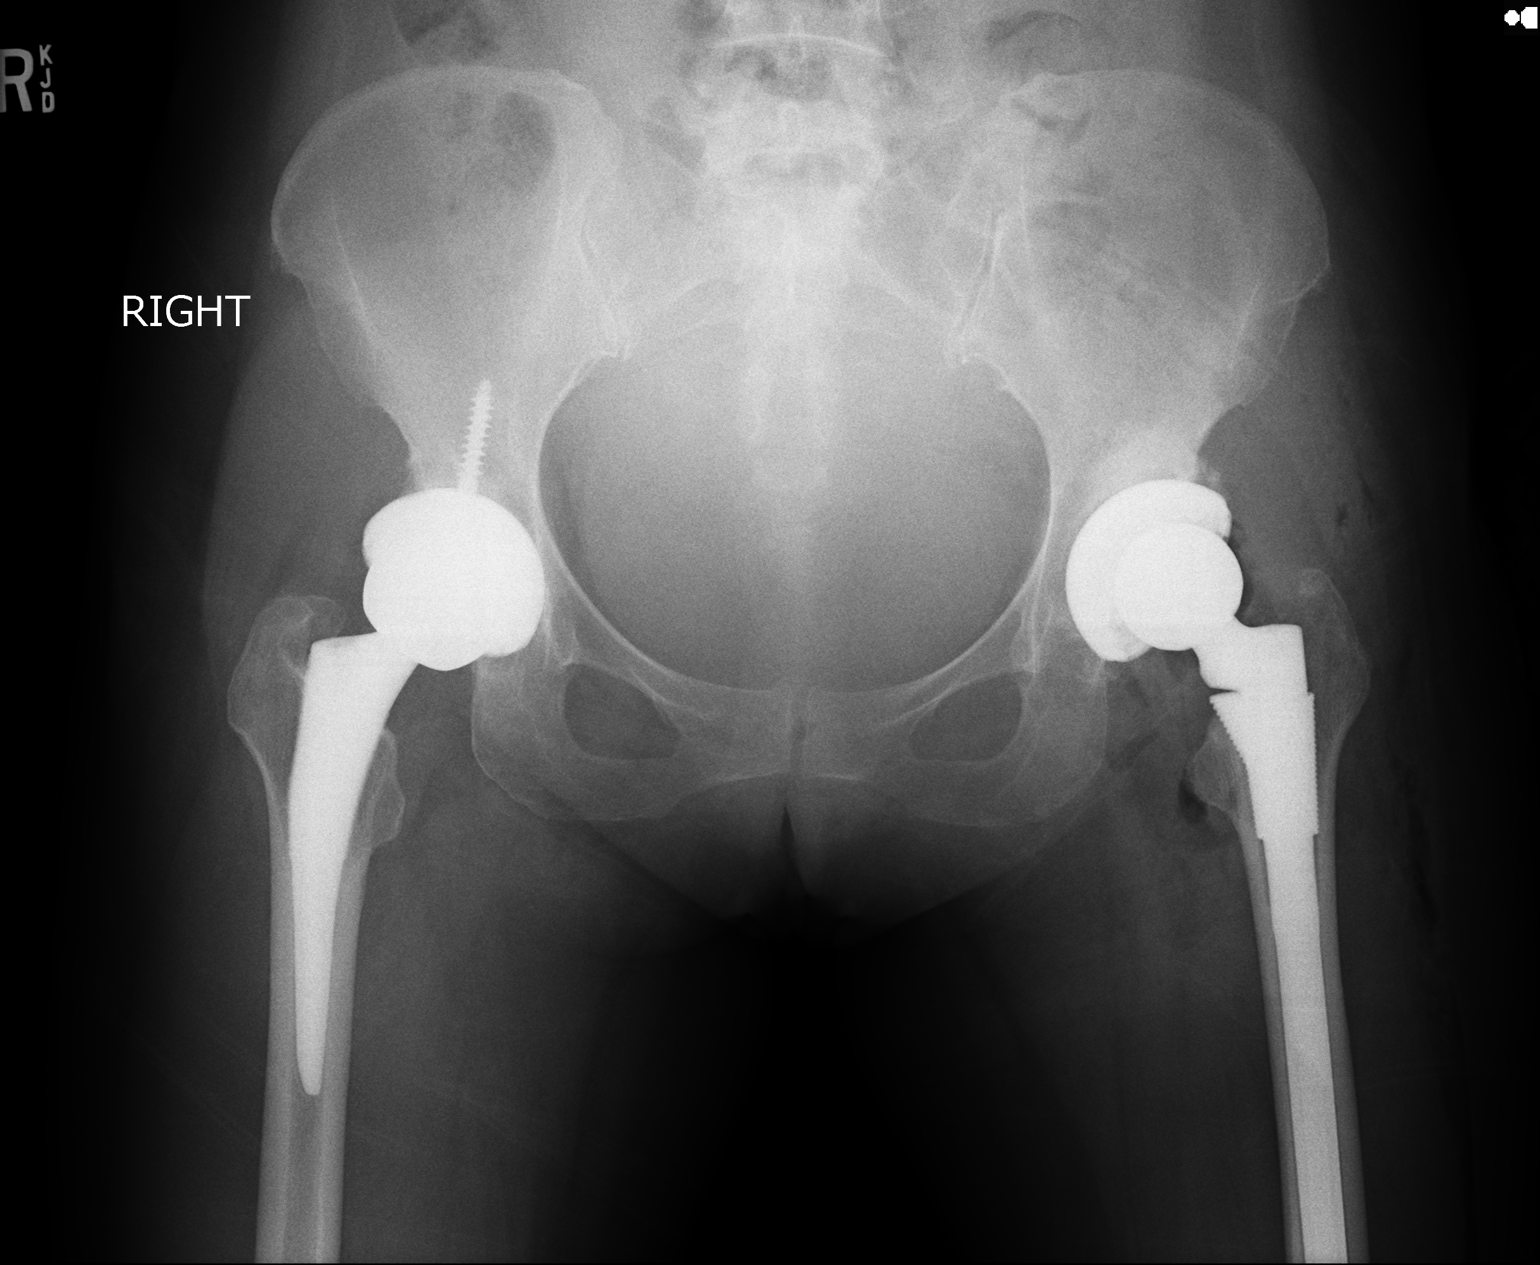

[1 of 1 positions shown; findings below may reference images not displayed]

FINDINGS: The patient is status post partially visualized interval left total
hip arthroplasty, with well-positioned left acetabular and left
proximal femoral prostheses. Status post previous right total hip
arthroplasty, with no evidence of right hip hardware fracture or
loosening. No evidence of hip dislocation on this single frontal
view. No osseous fracture or suspicious focal osseous lesion.
Expected gas within and surrounding the left hip joint.
IMPRESSION: Satisfactory appearance status post interval left total hip
arthroplasty.

## 2017-08-12 ENCOUNTER — Other Ambulatory Visit: Payer: Self-pay | Admitting: Internal Medicine

## 2017-09-14 ENCOUNTER — Encounter: Payer: 59 | Admitting: Internal Medicine

## 2017-09-15 ENCOUNTER — Encounter: Payer: Self-pay | Admitting: Internal Medicine

## 2017-09-15 NOTE — Progress Notes (Signed)
Subjective:    Patient ID: Maria Barker, female    DOB: Nov 29, 1961, 56 y.o.   MRN: [id number]  HPI She is here for a physical exam.   She is not sleeping well.  If she does not sleep well she is lightheaded or dizzy.  She sometimes has difficulty falling asleep.  She wakes up frequently. She sometimes has difficulty falling back asleep.  She feels tired during the day.    Joint pain:  She has b/l shoulder pain the right more than the left and her right middle finger pain.  She wonders if it is from work - she does repetitive work regularly.  She takes tylenol arthritis.   Medications and allergies reviewed with patient and updated if appropriate.  Patient Active Problem List   Diagnosis Date Noted  . Chronic pain of left knee 04/12/2017  . B12 deficiency 08/13/2016  . S/P bilateral hip replacements 08/13/2016  . 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 Primary osteoarthritis of left hip 03/07/2016  . Cephalalgia 08/12/2015  . Family history of brain aneurysm 08/12/2015  . Gastroesophageal reflux disease without esophagitis 08/12/2015  . Hypothyroid 02/06/2015  . Depression   . Hyperlipidemia with target LDL less than 130   . Osteoarthritis, hip, bilateral 04/13/2013  . Chronic insomnia 04/13/2013    Current Outpatient Prescriptions on File Prior to Visit  Medication Sig Dispense Refill  . atorvastatin (LIPITOR) 10 MG tablet Take 1 tablet (10 mg total) by mouth daily. Need office visit with labs for further refills 30 tablet 0  . Calcium Carbonate-Vitamin D (CALCIUM-VITAMIN D3 PO) Take 1 each by mouth 2 (two) times daily.    Marland Kitchen levothyroxine (SYNTHROID, LEVOTHROID) 50 MCG tablet Take 1 tablet (50 mcg total) by mouth daily before breakfast. 90 tablet 3  . ranitidine (ZANTAC) 150 MG tablet TAKE ONE TABLET BY MOUTH TWICE DAILY 60 tablet 3  . vitamin B-12 (CYANOCOBALAMIN) 1000 MCG tablet Take 1,000 mcg by mouth daily.     No current facility-administered  medications on file prior to visit.     Past Medical History:  Diagnosis Date  . Arthritis    R THR 2011  . Back pain   . Frequent headaches   . Hyperlipidemia LDL goal < 130   . Hypothyroid 02/06/2015   Dx 01/2015: TSH 5.4  . Insomnia   . Left hip pain   . Pain of left thigh     Past Surgical History:  Procedure Laterality Date  . 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 TOTAL HIP ARTHROPLASTY Left 03/08/2016   Procedure: TOTAL HIP ARTHROPLASTY;  Surgeon: Frederik Pear, MD;  Location: [facility];  Service: Orthopedics;  Laterality: Left;  . TUBAL LIGATION    . UPPER GASTROINTESTINAL ENDOSCOPY      Social History   Social History  . Marital status: Married    Spouse name: N/A  . Number of children: 5  . Years of education: N/A   Occupational History  . 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 Smoking status: Never Smoker  . Smokeless tobacco: Never Used  . Alcohol use No  . Drug use: No  . Sexual activity: Not Asked   Other Topics Concern  . None   Social History Narrative  . None    Family History  Problem Relation Age of Onset  . CVA Mother   . Hypertension Mother     Review of Systems  Constitutional: Positive for fatigue. Negative for chills and fever.  Eyes: Negative for visual disturbance.  Respiratory: Negative for cough, shortness of breath and wheezing.   Cardiovascular: Negative for chest pain, palpitations and leg swelling.  Gastrointestinal: Negative for abdominal pain, blood in stool, constipation, diarrhea and nausea.       Occ gerd  Genitourinary: Negative for dysuria and enuresis.  Musculoskeletal: Positive for arthralgias.  Skin: Negative for color change and rash.  Neurological: Positive for dizziness, light-headedness and headaches (from not sleeping enough).  Psychiatric/Behavioral: Positive for sleep disturbance. Negative for dysphoric mood. The patient is not nervous/anxious.        Objective:    Vitals:   09/16/17 0850  BP: 130/80  Pulse: (!) 58  Temp: 98 F (36.7 C)  SpO2: 98%   Filed Weights   09/16/17 0850  Weight: 128 lb 8 oz (58.3 kg)   Body mass index is 25.95 kg/m.  Wt Readings from Last 3 Encounters:  09/16/17 128 lb 8 oz (58.3 kg)  04/26/17 127 lb (57.6 kg)  04/12/17 127 lb 6.4 oz (57.8 kg)     Physical Exam Constitutional: She appears well-developed and well-nourished. No distress.  HENT:  Head: Normocephalic and atraumatic.  Right Ear: External ear normal. Normal ear canal and TM Left Ear: External ear normal.  Normal ear canal and TM Mouth/Throat: Oropharynx is clear and moist.  Eyes: Conjunctivae and EOM are normal.  Neck: Neck supple. No tracheal deviation present. No thyromegaly present.  No carotid bruit  Cardiovascular: Normal rate, regular rhythm and normal heart sounds.   No murmur heard.  No edema. Pulmonary/Chest: Effort normal and breath sounds normal. No respiratory distress. She has no wheezes. She has no rales.  Breast: deferred to Gyn Abdominal: Soft. She exhibits no distension. There is no tenderness.  Lymphadenopathy: She has no cervical adenopathy.  Skin: Skin is warm and dry. She is not diaphoretic.  Psychiatric: She has a normal mood and affect. Her behavior is normal.       Assessment & Plan:   Physical exam: Screening blood work  ordered Immunizations   Flu recommended Colonoscopy   Up to date  Mammogram   Up to date  Gyn     Up to date  EKG  Last done 02/2016 Exercise  Not exercising, stressed regular exercise Weight  BMI good Skin   No concerns Substance abuse   none  See Problem List for Assessment and Plan of chronic medical problems.   FU annually

## 2017-09-15 NOTE — Assessment & Plan Note (Signed)
Check lipid panel  Continue daily statin Regular exercise and healthy diet encouraged  

## 2017-09-15 NOTE — Patient Instructions (Addendum)
Try melatonin, which is over the counter, for your sleep. Take 2-6 mg 30 minutes prior to bedtime.  Go to bed and wake up at the same time ideally.   If that does not work let me know and we can try a prescription medication.   Test(s) ordered today. Your results will be released to [place] (or called to you) after review, usually within 72hours after test completion. If any changes need to be made, you will be notified at that same time.  All other Health Maintenance issues reviewed.   All recommended immunizations and age-appropriate screenings are up-to-date or discussed.  Flu immunization administered today.   Medications reviewed and updated.  No changes recommended at this time.    Please followup in 1 year       Health Maintenance, Female Adopting a healthy lifestyle and getting preventive care can go a long way to promote health and wellness. Talk with your health care provider about what schedule of regular examinations is right for you. This is a good chance for you to check in with your provider about disease prevention and staying healthy. In between checkups, there are plenty of things you can do on your own. Experts have done a lot of research about which lifestyle changes and preventive measures are most likely to keep you healthy. Ask your health care provider for more information. Weight and diet Eat a healthy diet  Be sure to include plenty of vegetables, fruits, low-fat dairy products, and lean protein.  Do not eat a lot of foods high in solid fats, added sugars, or salt.  Get regular exercise. This is one of the most important things you can do for your health. ? Most adults should exercise for at least 150 minutes each week. The exercise should increase your heart rate and make you sweat (moderate-intensity exercise). ? Most adults should also do strengthening exercises at least twice a week. This is in addition to the moderate-intensity exercise.  Maintain a healthy  weight  Body mass index (BMI) is a measurement that can be used to identify possible weight problems. It estimates body fat based on height and weight. Your health care provider can help determine your BMI and help you achieve or maintain a healthy weight.  For females 12 years of age and older: ? A BMI below 18.5 is considered underweight. ? A BMI of 18.5 to 24.9 is normal. ? A BMI of 25 to 29.9 is considered overweight. ? A BMI of 30 and above is considered obese.  Watch levels of cholesterol and blood lipids  You should start having your blood tested for lipids and cholesterol at 54 years of age, then have this test every 5 years.  You may need to have your cholesterol levels checked more often if: ? Your lipid or cholesterol levels are high. ? You are older than 54 years of age. ? You are at high risk for heart disease.  Cancer screening Lung Cancer  Lung cancer screening is recommended for adults 53-78 years old who are at high risk for lung cancer because of a history of smoking.  A yearly low-dose CT scan of the lungs is recommended for people who: ? Currently smoke. ? Have quit within the past 15 years. ? Have at least a 30-pack-year history of smoking. A pack year is smoking an average of one pack of cigarettes a day for 1 year.  Yearly screening should continue until it has been 15 years since you quit.  Yearly screening should stop if you develop a health problem that would prevent you from having lung cancer treatment.  Breast Cancer  Practice breast self-awareness. This means understanding how your breasts normally appear and feel.  It also means doing regular breast self-exams. Let your health care provider know about any changes, no matter how small.  If you are in your 20s or 30s, you should have a clinical breast exam (CBE) by a health care provider every 1-3 years as part of a regular health exam.  If you are 51 or older, have a CBE every year. Also consider  having a breast X-ray (mammogram) every year.  If you have a family history of breast cancer, talk to your health care provider about genetic screening.  If you are at high risk for breast cancer, talk to your health care provider about having an MRI and a mammogram every year.  Breast cancer gene (BRCA) assessment is recommended for women who have family members with BRCA-related cancers. BRCA-related cancers include: ? Breast. ? Ovarian. ? Tubal. ? Peritoneal cancers.  Results of the assessment will determine the need for genetic counseling and BRCA1 and BRCA2 testing.  Cervical Cancer Your health care provider may recommend that you be screened regularly for cancer of the pelvic organs (ovaries, uterus, and vagina). This screening involves a pelvic examination, including checking for microscopic changes to the surface of your cervix (Pap test). You may be encouraged to have this screening done every 3 years, beginning at age 71.  For women ages 68-65, health care providers may recommend pelvic exams and Pap testing every 3 years, or they may recommend the Pap and pelvic exam, combined with testing for human papilloma virus (HPV), every 5 years. Some types of HPV increase your risk of cervical cancer. Testing for HPV may also be done on women of any age with unclear Pap test results.  Other health care providers may not recommend any screening for nonpregnant women who are considered low risk for pelvic cancer and who do not have symptoms. Ask your health care provider if a screening pelvic exam is right for you.  If you have had past treatment for cervical cancer or a condition that could lead to cancer, you need Pap tests and screening for cancer for at least 20 years after your treatment. If Pap tests have been discontinued, your risk factors (such as having a new sexual partner) need to be reassessed to determine if screening should resume. Some women have medical problems that increase  the chance of getting cervical cancer. In these cases, your health care provider may recommend more frequent screening and Pap tests.  Colorectal Cancer  This type of cancer can be detected and often prevented.  Routine colorectal cancer screening usually begins at 54 years of age and continues through 54 years of age.  Your health care provider may recommend screening at an earlier age if you have risk factors for colon cancer.  Your health care provider may also recommend using home test kits to check for hidden blood in the stool.  A small camera at the end of a tube can be used to examine your colon directly (sigmoidoscopy or colonoscopy). This is done to check for the earliest forms of colorectal cancer.  Routine screening usually begins at age 65.  Direct examination of the colon should be repeated every 5-10 years through 54 years of age. However, you may need to be screened more often if early forms of precancerous  polyps or small growths are found.  Skin Cancer  Check your skin from head to toe regularly.  Tell your health care provider about any new moles or changes in moles, especially if there is a change in a mole's shape or color.  Also tell your health care provider if you have a mole that is larger than the size of a pencil eraser.  Always use sunscreen. Apply sunscreen liberally and repeatedly throughout the day.  Protect yourself by wearing long sleeves, pants, a wide-brimmed hat, and sunglasses whenever you are outside.  Heart disease, diabetes, and high blood pressure  High blood pressure causes heart disease and increases the risk of stroke. High blood pressure is more likely to develop in: ? People who have blood pressure in the high end of the normal range (130-139/85-89 mm Hg). ? People who are overweight or obese. ? People who are African American.  If you are 61-70 years of age, have your blood pressure checked every 3-5 years. If you are 29 years of age  or older, have your blood pressure checked every year. You should have your blood pressure measured twice-once when you are at a hospital or clinic, and once when you are not at a hospital or clinic. Record the average of the two measurements. To check your blood pressure when you are not at a hospital or clinic, you can use: ? An automated blood pressure machine at a pharmacy. ? A home blood pressure monitor.  If you are between 12 years and 71 years old, ask your health care provider if you should take aspirin to prevent strokes.  Have regular diabetes screenings. This involves taking a blood sample to check your fasting blood sugar level. ? If you are at a normal weight and have a low risk for diabetes, have this test once every three years after 54 years of age. ? If you are overweight and have a high risk for diabetes, consider being tested at a younger age or more often. Preventing infection Hepatitis B  If you have a higher risk for hepatitis B, you should be screened for this virus. You are considered at high risk for hepatitis B if: ? You were born in a country where hepatitis B is common. Ask your health care provider which countries are considered high risk. ? Your parents were born in a high-risk country, and you have not been immunized against hepatitis B (hepatitis B vaccine). ? You have HIV or AIDS. ? You use needles to inject street drugs. ? You live with someone who has hepatitis B. ? You have had sex with someone who has hepatitis B. ? You get hemodialysis treatment. ? You take certain medicines for conditions, including cancer, organ transplantation, and autoimmune conditions.  Hepatitis C  Blood testing is recommended for: ? Everyone born from [birth year] through 1963. ? Anyone with known risk factors for hepatitis C.  Sexually transmitted infections (STIs)  You should be screened for sexually transmitted infections (STIs) including gonorrhea and chlamydia if: ? You are  sexually active and are younger than 54 years of age. ? You are older than 54 years of age and your health care provider tells you that you are at risk for this type of infection. ? Your sexual activity has changed since you were last screened and you are at an increased risk for chlamydia or gonorrhea. Ask your health care provider if you are at risk.  If you do not have HIV, but are at  risk, it may be recommended that you take a prescription medicine daily to prevent HIV infection. This is called pre-exposure prophylaxis (PrEP). You are considered at risk if: ? You are sexually active and do not regularly use condoms or know the HIV status of your partner(s). ? You take drugs by injection. ? You are sexually active with a partner who has HIV.  Talk with your health care provider about whether you are at high risk of being infected with HIV. If you choose to begin PrEP, you should first be tested for HIV. You should then be tested every 3 months for as long as you are taking PrEP. Pregnancy  If you are premenopausal and you may become pregnant, ask your health care provider about preconception counseling.  If you may become pregnant, take 400 to 800 micrograms (mcg) of folic acid every day.  If you want to prevent pregnancy, talk to your health care provider about birth control (contraception). Osteoporosis and menopause  Osteoporosis is a disease in which the bones lose minerals and strength with aging. This can result in serious bone fractures. Your risk for osteoporosis can be identified using a bone density scan.  If you are 74 years of age or older, or if you are at risk for osteoporosis and fractures, ask your health care provider if you should be screened.  Ask your health care provider whether you should take a calcium or vitamin D supplement to lower your risk for osteoporosis.  Menopause may have certain physical symptoms and risks.  Hormone replacement therapy may reduce some  of these symptoms and risks. Talk to your health care provider about whether hormone replacement therapy is right for you. Follow these instructions at home:  Schedule regular health, dental, and eye exams.  Stay current with your immunizations.  Do not use any tobacco products including cigarettes, chewing tobacco, or electronic cigarettes.  If you are pregnant, do not drink alcohol.  If you are breastfeeding, limit how much and how often you drink alcohol.  Limit alcohol intake to no more than 1 drink per day for nonpregnant women. One drink equals 12 ounces of beer, 5 ounces of wine, or 1 ounces of hard liquor.  Do not use street drugs.  Do not share needles.  Ask your health care provider for help if you need support or information about quitting drugs.  Tell your health care provider if you often feel depressed.  Tell your health care provider if you have ever been abused or do not feel safe at home. This information is not intended to replace advice given to you by your health care provider. Make sure you discuss any questions you have with your health care provider. Document Released: 06/28/2011 Document Revised: 05/20/2016 Document Reviewed: 09/16/2015 Elsevier Interactive Patient Education  [employer].

## 2017-09-15 NOTE — Assessment & Plan Note (Signed)
Check tsh  Titrate med dose if needed  

## 2017-09-16 ENCOUNTER — Encounter: Payer: Self-pay | Admitting: Internal Medicine

## 2017-09-16 ENCOUNTER — Other Ambulatory Visit (INDEPENDENT_AMBULATORY_CARE_PROVIDER_SITE_OTHER): Payer: 59

## 2017-09-16 ENCOUNTER — Ambulatory Visit (INDEPENDENT_AMBULATORY_CARE_PROVIDER_SITE_OTHER): Payer: 59 | Admitting: Internal Medicine

## 2017-09-16 VITALS — BP 130/80 | HR 58 | Temp 98.0°F | Ht 59.0 in | Wt 128.5 lb

## 2017-09-16 DIAGNOSIS — R739 Hyperglycemia, unspecified: Secondary | ICD-10-CM

## 2017-09-16 DIAGNOSIS — E038 Other specified hypothyroidism: Secondary | ICD-10-CM

## 2017-09-16 DIAGNOSIS — F5104 Psychophysiologic insomnia: Secondary | ICD-10-CM | POA: Diagnosis not present

## 2017-09-16 DIAGNOSIS — E785 Hyperlipidemia, unspecified: Secondary | ICD-10-CM | POA: Diagnosis not present

## 2017-09-16 DIAGNOSIS — K219 Gastro-esophageal reflux disease without esophagitis: Secondary | ICD-10-CM

## 2017-09-16 DIAGNOSIS — Z23 Encounter for immunization: Secondary | ICD-10-CM

## 2017-09-16 DIAGNOSIS — Z0001 Encounter for general adult medical examination with abnormal findings: Secondary | ICD-10-CM | POA: Diagnosis not present

## 2017-09-16 DIAGNOSIS — Z0000 Encounter for general adult medical examination without abnormal findings: Secondary | ICD-10-CM

## 2017-09-16 LAB — CBC WITH DIFFERENTIAL/PLATELET
BASOS PCT: 2.2 % (ref 0.0–3.0)
Basophils Absolute: 0.2 10*3/uL — ABNORMAL HIGH (ref 0.0–0.1)
Eosinophils Absolute: 1.4 10*3/uL — ABNORMAL HIGH (ref 0.0–0.7)
HCT: 37.9 % (ref 36.0–46.0)
Hemoglobin: 12.6 g/dL (ref 12.0–15.0)
LYMPHS ABS: 1.4 10*3/uL (ref 0.7–4.0)
Lymphocytes Relative: 19.9 % (ref 12.0–46.0)
MCHC: 33.2 g/dL (ref 30.0–36.0)
MCV: 84.9 fl (ref 78.0–100.0)
MONO ABS: 0.5 10*3/uL (ref 0.1–1.0)
MONOS PCT: 7.9 % (ref 3.0–12.0)
NEUTROS PCT: 49.5 % (ref 43.0–77.0)
Neutro Abs: 3.4 10*3/uL (ref 1.4–7.7)
Platelets: 324 10*3/uL (ref 150.0–400.0)
RBC: 4.46 Mil/uL (ref 3.87–5.11)
RDW: 12.9 % (ref 11.5–15.5)
WBC: 6.9 10*3/uL (ref 4.0–10.5)

## 2017-09-16 LAB — LIPID PANEL
CHOL/HDL RATIO: 3
Cholesterol: 150 mg/dL (ref 0–200)
HDL: 55.4 mg/dL (ref >39.00)
LDL CALC: 70 mg/dL (ref 0–99)
NONHDL: 94.81
TRIGLYCERIDES: 126 mg/dL (ref 0.0–149.0)
VLDL: 25.2 mg/dL (ref 0.0–40.0)

## 2017-09-16 LAB — COMPREHENSIVE METABOLIC PANEL
ALT: 18 U/L (ref 0–35)
AST: 23 U/L (ref 0–37)
Albumin: 4.2 g/dL (ref 3.5–5.2)
Alkaline Phosphatase: 72 U/L (ref 39–117)
BUN: 13 mg/dL (ref 6–23)
CO2: 26 meq/L (ref 19–32)
Calcium: 9.5 mg/dL (ref 8.4–10.5)
Chloride: 102 mEq/L (ref 96–112)
Creatinine, Ser: 0.61 mg/dL (ref 0.40–1.20)
GFR: 108.53 mL/min (ref >60.00)
Glucose, Bld: 94 mg/dL (ref 70–99)
POTASSIUM: 3.8 meq/L (ref 3.5–5.1)
SODIUM: 136 meq/L (ref 135–145)
TOTAL PROTEIN: 8.3 g/dL (ref 6.0–8.3)
Total Bilirubin: 0.5 mg/dL (ref 0.2–1.2)

## 2017-09-16 LAB — TSH: TSH: 1.72 u[IU]/mL (ref 0.35–4.50)

## 2017-09-16 LAB — HEMOGLOBIN A1C: HEMOGLOBIN A1C: 6 % (ref 4.6–6.5)

## 2017-09-16 MED ORDER — ATORVASTATIN CALCIUM 10 MG PO TABS: 10.0000 mg | ORAL_TABLET | Freq: Every day | ORAL | 3 refills | Status: DC

## 2017-09-16 MED ORDER — RANITIDINE HCL 150 MG PO TABS: 150.0000 mg | ORAL_TABLET | Freq: Two times a day (BID) | ORAL | 3 refills | Status: DC

## 2017-09-16 NOTE — Assessment & Plan Note (Signed)
GERD controlled Continue twice daily medication

## 2017-09-16 NOTE — Assessment & Plan Note (Signed)
Try melatonin Has tried several other medications in past Stressed regular exercise, same bed time/wake time

## 2017-09-16 NOTE — Assessment & Plan Note (Signed)
Check a1c 

## 2017-09-17 ENCOUNTER — Encounter: Payer: Self-pay | Admitting: Internal Medicine

## 2017-09-17 DIAGNOSIS — R7303 Prediabetes: Secondary | ICD-10-CM | POA: Insufficient documentation

## 2017-09-26 ENCOUNTER — Other Ambulatory Visit: Payer: Self-pay | Admitting: Internal Medicine

## 2018-01-17 ENCOUNTER — Encounter: Payer: Self-pay | Admitting: Family Medicine

## 2018-01-17 ENCOUNTER — Ambulatory Visit: Payer: 59 | Admitting: Family Medicine

## 2018-01-17 ENCOUNTER — Ambulatory Visit: Payer: 59 | Admitting: Internal Medicine

## 2018-01-17 ENCOUNTER — Ambulatory Visit: Payer: Self-pay

## 2018-01-17 VITALS — BP 118/66 | HR 68 | Temp 98.1°F | Ht 59.0 in | Wt 125.0 lb

## 2018-01-17 DIAGNOSIS — R1010 Upper abdominal pain, unspecified: Secondary | ICD-10-CM | POA: Insufficient documentation

## 2018-01-17 NOTE — Progress Notes (Signed)
Maria Barker - 57 y.o. female MRN [id number]  Date of birth: Jan 25, 1961  SUBJECTIVE:  Including CC & ROS.  Chief Complaint  Patient presents with  . Abdominal Pain    Maria Barker  57 y.o. female that is presenting with abdominal pain. Pain has been ongoing for one week. Located in her upper abdominal quadrant. Pain is described as an intermittent stabbing and burning sensation. Denies acid reflux. She has been taking Zantac with no improvement. Denies nausea, vomiting or diarrhea. Denies fevers.   Review of colonoscopy from 2014 showed 1 removal of a sessile polyp that was hyperplastic in nature.  Review of Systems  Constitutional: Negative for fever.  HENT: Negative for sinus pain.   Respiratory: Negative for chest tightness.   Gastrointestinal: Positive for abdominal pain and constipation. Negative for abdominal distention.  Musculoskeletal: Negative for back pain.  Neurological: Negative for weakness.  Hematological: Negative for adenopathy.  Psychiatric/Behavioral: Negative for agitation.    HISTORY: Past Medical, Surgical, Social, and Family History Reviewed & Updated per EMR.   Pertinent Historical Findings include:  Past Medical History:  Diagnosis Date  . Arthritis    R THR 2011  . Back pain   . Frequent headaches   . Hyperlipidemia LDL goal < 130   . Hypothyroid 02/06/2015   Dx 01/2015: TSH 5.4  . Insomnia   . Left hip pain   . Pain of left thigh     Past Surgical History:  Procedure Laterality Date  . 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 TUBAL LIGATION    . UPPER GASTROINTESTINAL ENDOSCOPY      Allergies  Allergen Reactions  . Asa [Aspirin] Nausea And Vomiting    UPSET STOMACH    Family History  Problem Relation Age of Onset  . CVA Mother   . Hypertension Mother      Social  History   Socioeconomic History  . Marital status: Married    Spouse name: Not on file  . Number of children: 5  . Years of education: Not on file  . Highest education level: Not on file  Social Needs  . Financial resource strain: Not on file  . Food insecurity - worry: Not on file  . Food insecurity - inability: Not on file  . Transportation needs - medical: Not on file  . Transportation needs - non-medical: Not on file  Occupational History  . Occupation: IT sales professional: [employer]  Tobacco Use  . Smoking status: Never Smoker  . Smokeless tobacco: Never Used  Substance and Sexual Activity  . Alcohol use: No  . Drug use: No  . Sexual activity: Not on file  Other Topics Concern  . Not on file  Social History Narrative  . Not on file     PHYSICAL EXAM:  VS: BP 118/66 (BP Location: Left Arm, Patient Position: Sitting, Cuff Size: Normal)   Pulse 68   Temp 98.1 F (36.7 C) (Oral)   Ht 4\' 11"  (1.499 m)   Wt 125 lb (56.7 kg)   SpO2 98%   BMI 25.25 kg/m  Physical Exam Gen: NAD, alert, cooperative with exam, well-appearing ENT: normal lips, normal nasal mucosa,  Eye: normal EOM, normal conjunctiva and lids CV:  no edema, +2 pedal pulses   Resp: no accessory muscle  use, non-labored,  GI: no masses, mild upper quadrant tenderness, no hernia, soft, +BS Skin: no rashes, no areas of induration  Neuro: normal tone, normal sensation to touch Psych:  normal insight, alert and oriented MSK: normal gait, normal strength     ASSESSMENT & PLAN:   Pain of upper abdomen Doesn't take anti-inflammatories on a regular basis. Has thyroid disease but pre-diabetes. Possible pancreatitis versus ulcer. Possible for H. Pylori. - amylase and lipase  - will stop prilosec and counseled to follow up in two weeks to have stool h pylori testing  - if testing is inconclusive or no improvement can consider referral to EGD vs empiric treatment with higher dose PPI

## 2018-01-17 NOTE — Assessment & Plan Note (Addendum)
Doesn't take anti-inflammatories on a regular basis. Has thyroid disease but pre-diabetes. Possible pancreatitis versus ulcer. Possible for H. Pylori. - amylase and lipase  - will stop prilosec and counseled to follow up in two weeks to have stool h pylori testing  - if testing is inconclusive or no improvement can consider referral to EGD vs empiric treatment with higher dose PPI

## 2018-01-17 NOTE — Patient Instructions (Signed)
Please stop all reflux medication for two weeks.  Please return after the two weeks and return to the lab in order to get the stool test performed.  We will call you back with the results from today.

## 2018-01-17 NOTE — Telephone Encounter (Signed)
Pt calling with c/o "upper" abdominal pain. States the pain is constant but varies in intensity. Pt denies fever, but lately has been having "sharp" abdominal pain. Pt states that she has taken Zantac before for a similar pain and got some relief but is not effective now. Denies fever, but states she has been having "hot flashes" lately. Called PCP office to see if she needs an OV versus UCC. Spoke with Sam and states pt can be seen in offce. Appt made for today at 1:00 pm with Dr Raeford Razor.  Reason for Disposition . [1] MILD-MODERATE pain AND [2] constant AND [3] present > 2 hours  Answer Assessment - Initial Assessment Questions 1. LOCATION: "Where does it hurt?"      Upper abdomen 2. RADIATION: "Does the pain shoot anywhere else?" (e.g., chest, back)     Yes to the back 3. ONSET: "When did the pain begin?" (e.g., minutes, hours or days ago)      Last Wednesday 4. SUDDEN: "Gradual or sudden onset?"     gradual 5. PATTERN "Does the pain come and go, or is it constant?"    - If constant: "Is it getting better, staying the same, or worsening?"      (Note: Constant means the pain never goes away completely; most serious pain is constant and it progresses)     - If intermittent: "How long does it last?" "Do you have pain now?"     (Note: Intermittent means the pain goes away completely between bouts)     Constant but varies in intensity 6. SEVERITY: "How bad is the pain?"  (e.g., Scale 1-10; mild, moderate, or severe)   - MILD (1-3): doesn't interfere with normal activities, abdomen soft and not tender to touch    - MODERATE (4-7): interferes with normal activities or awakens from sleep, tender to touch    - SEVERE (8-10): excruciating pain, doubled over, unable to do any normal activities      When its at its worst its a 7-8 out of 10.  7. RECURRENT SYMPTOM: "Have you ever had this type of abdominal pain before?" If so, ask: "When was the last time?" and "What happened that time?"      Yes took  Zantac and it helped. Now Zantac helped "a little bit but not long." 8. CAUSE: "What do you think is causing the abdominal pain?"     Pt does not know 9. RELIEVING/AGGRAVATING FACTORS: "What makes it better or worse?" (e.g., movement, antacids, bowel movement)     Zantac helped for a little while 10. OTHER SYMPTOMS: "Has there been any vomiting, diarrhea, constipation, or urine problems?"       no 11. PREGNANCY: "Is there any chance you are pregnant?" "When was your last menstrual period?"       No no periods for 6 years.  Protocols used: ABDOMINAL PAIN [facility]

## 2018-01-19 ENCOUNTER — Ambulatory Visit: Payer: 59 | Admitting: Internal Medicine

## 2018-02-01 ENCOUNTER — Other Ambulatory Visit (INDEPENDENT_AMBULATORY_CARE_PROVIDER_SITE_OTHER): Payer: 59

## 2018-02-01 DIAGNOSIS — R1010 Upper abdominal pain, unspecified: Secondary | ICD-10-CM | POA: Diagnosis not present

## 2018-02-01 LAB — LIPASE: Lipase: 23 U/L (ref 11.0–59.0)

## 2018-02-01 LAB — AMYLASE: Amylase: 70 U/L (ref 27–131)

## 2018-02-02 LAB — HELICOBACTER PYLORI  SPECIAL ANTIGEN
MICRO NUMBER:: 90160381
SPECIMEN QUALITY: ADEQUATE

## 2018-03-02 DIAGNOSIS — R05 Cough: Secondary | ICD-10-CM | POA: Diagnosis not present

## 2018-03-08 NOTE — Progress Notes (Signed)
Subjective:    Patient ID: Maria Barker, female    DOB: 03-31-1961, 57 y.o.   MRN: [id number]  HPI The patient is here for an acute visit.  Her symptoms started last wednesday.   She was seen at urgent care 03/02/18 and was diagnosed with the flu.  She was prescribed tamiflu,but did not know she was prescribed it and did not take it.  She is still having fever, chills, dry cough, wheeze, nausea, headaches, lightheadedness/dizziness. She denies nasal congestion, ear pain, sinus pain, sore throat and diarrhea.  She has not vomited.  Her symptom have improved.  She was prescribed an anti-nausea medication, but it was too expensive.  She has been taking theraflu and excedrin.    She does have frequent headaches.  She does have some chronic headaches,  But with this illness they have been worse.  Her daughters are with her today and feels some of her headaches are because she does not sleep.  She typically does not get nausea with her headaches.  Difficulty sleeping/insomnia: She works second shift typically gets home at midnight.  She has difficulty falling asleep and typically does not sleep well.  Years ago she was on Ambien and that did work.  She is unsure if that is why she is having the headaches.  Right wrist pain:  It started 8 weeks ago.  She denies any injury.  She is doing repetitive movement at work.  Any movement of the wrist does cause pain, especially medial-lateral movements.  She denies any weakness, numbness or tingling.  There is no swelling.  Pain is primarily on the lateral aspect along the thumb.  It does radiate slightly up to the forearm.   Medications and allergies reviewed with patient and updated if appropriate.  Patient Active Problem List   Diagnosis Date Noted  . Pain of upper abdomen 01/17/2018  . Prediabetes 09/17/2017  . Chronic pain of left knee 04/12/2017  . B12 deficiency 08/13/2016  . S/P bilateral hip replacements 08/13/2016  . 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 Primary osteoarthritis of left hip 03/07/2016  . Cephalalgia 08/12/2015  . Family history of brain aneurysm 08/12/2015  . Gastroesophageal reflux disease without esophagitis 08/12/2015  . Hypothyroid 02/06/2015  . Depression   . Hyperlipidemia with target LDL less than 130   . Osteoarthritis, hip, bilateral 04/13/2013  . Chronic insomnia 04/13/2013    Current Outpatient Medications on File Prior to Visit  Medication Sig Dispense Refill  . atorvastatin (LIPITOR) 10 MG tablet Take 1 tablet (10 mg total) by mouth daily. Need office visit with labs for further refills 90 tablet 3  . Calcium Carbonate-Vitamin D (CALCIUM-VITAMIN D3 PO) Take 1 each by mouth 2 (two) times daily.    Marland Kitchen levothyroxine (SYNTHROID, LEVOTHROID) 50 MCG tablet TAKE ONE TABLET BY MOUTH ONCE DAILY BEFORE  BREAKFAST 90 tablet 3  . ranitidine (ZANTAC) 150 MG tablet Take 1 tablet (150 mg total) by mouth 2 (two) times daily. 180 tablet 3  . vitamin B-12 (CYANOCOBALAMIN) 1000 MCG tablet Take 1,000 mcg by mouth daily.     No current facility-administered medications on file prior to visit.     Past Medical History:  Diagnosis Date  . Arthritis    R THR 2011  . Back pain   . Frequent headaches   . Hyperlipidemia LDL goal < 130   . Hypothyroid 02/06/2015   Dx 01/2015: TSH 5.4  . Insomnia   . Left  hip pain   . Pain of left thigh     Past Surgical History:  Procedure Laterality Date  . 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 TOTAL HIP ARTHROPLASTY Left 03/08/2016   Procedure: TOTAL HIP ARTHROPLASTY;  Surgeon: Frederik Pear, MD;  Location: [facility];  Service: Orthopedics;  Laterality: Left;  . TUBAL LIGATION    . UPPER GASTROINTESTINAL ENDOSCOPY      Social History   Socioeconomic History  . Marital status: Married    Spouse name: None  . Number of children: 5  . Years of education: None  . Highest education level: None  Social Needs  . Financial resource strain: None  .  Food insecurity - worry: None  . Food insecurity - inability: None  . Transportation needs - medical: None  . Transportation needs - non-medical: None  Occupational History  . Occupation: IT sales professional: [employer]  Tobacco Use  . Smoking status: Never Smoker  . Smokeless tobacco: Never Used  Substance and Sexual Activity  . Alcohol use: No  . Drug use: No  . Sexual activity: None  Other Topics Concern  . None  Social History Narrative  . None    Family History  Problem Relation Age of Onset  . CVA Mother   . Hypertension Mother     Review of Systems  Constitutional: Positive for chills and fever.  HENT: Negative for congestion, ear pain, sinus pain and sore throat.   Respiratory: Positive for cough (dry) and wheezing (at night). Negative for shortness of breath.   Gastrointestinal: Positive for nausea. Negative for abdominal pain and diarrhea.  Musculoskeletal: Negative for myalgias.  Neurological: Positive for dizziness, light-headedness and headaches.       Objective:   Vitals:   03/09/18 0903  BP: 136/76  Pulse: 65  Resp: 16  Temp: 98.5 F (36.9 C)  SpO2: 96%   Wt Readings from Last 3 Encounters:  03/09/18 125 lb (56.7 kg)  01/17/18 125 lb (56.7 kg)  09/16/17 128 lb 8 oz (58.3 kg)   Body mass index is 25.25 kg/m.   Physical Exam    GENERAL APPEARANCE: Appears stated age, well appearing, NAD EYES: conjunctiva clear, no icterus HEENT: bilateral tympanic membranes and ear canals normal, oropharynx with no erythema, no thyromegaly, trachea midline, no cervical or supraclavicular lymphadenopathy LUNGS: Clear to auscultation without wheeze or crackles, unlabored breathing, good air entry bilaterally CARDIOVASCULAR: Normal S1,S2 without murmurs, no edema SKIN: Warm, dry      Assessment & Plan:    See Problem List for Assessment and Plan of chronic medical problems.

## 2018-03-09 ENCOUNTER — Encounter: Payer: Self-pay | Admitting: Internal Medicine

## 2018-03-09 ENCOUNTER — Ambulatory Visit: Payer: 59 | Admitting: Internal Medicine

## 2018-03-09 ENCOUNTER — Encounter: Payer: Self-pay | Admitting: Emergency Medicine

## 2018-03-09 VITALS — BP 136/76 | HR 65 | Temp 98.5°F | Resp 16 | Wt 125.0 lb

## 2018-03-09 DIAGNOSIS — G8929 Other chronic pain: Secondary | ICD-10-CM

## 2018-03-09 DIAGNOSIS — G4700 Insomnia, unspecified: Secondary | ICD-10-CM | POA: Diagnosis not present

## 2018-03-09 DIAGNOSIS — M654 Radial styloid tenosynovitis [de Quervain]: Secondary | ICD-10-CM

## 2018-03-09 DIAGNOSIS — J101 Influenza due to other identified influenza virus with other respiratory manifestations: Secondary | ICD-10-CM

## 2018-03-09 DIAGNOSIS — R519 Headache, unspecified: Secondary | ICD-10-CM | POA: Insufficient documentation

## 2018-03-09 DIAGNOSIS — R51 Headache: Secondary | ICD-10-CM

## 2018-03-09 MED ORDER — TRAZODONE HCL 50 MG PO TABS: 50.0000 mg | ORAL_TABLET | Freq: Every evening | ORAL | 3 refills | Status: DC | PRN

## 2018-03-09 MED ORDER — PROMETHAZINE HCL 25 MG PO TABS
25.0000 mg | ORAL_TABLET | Freq: Three times a day (TID) | ORAL | 0 refills | Status: DC | PRN
Start: 2018-03-09 — End: 2018-10-12

## 2018-03-09 NOTE — Assessment & Plan Note (Signed)
Diagnosed 3/7 at urgent care Prescribe Tamiflu, but did not really she was prescribed it and did not take it Symptoms have improved, but still symptomatic Discussed that the only treatment at this point is symptomatic treatment Zofran was prescribed, but too expensive, will try Phenergan Increase rest and fluids Call if symptoms do not continue to improve

## 2018-03-09 NOTE — Patient Instructions (Addendum)
You have the flu and you should treat your symptoms with over the counter medications.   Take the anti-nausea up to three times a day as needed.    Try trazodone for your sleep.  Take one pill tonight - if this is not effective try taking 2 pills at bedtime.  If this is not effective call and we will try something different.     Follow up with sports medicine for the wrist.

## 2018-03-09 NOTE — Assessment & Plan Note (Signed)
Worsening headaches recently likely secondary to flu, but is also experiencing chronic headaches or frequent headaches Lack of sleep is likely 1 cause  and we will work on improving her sleep and then reevaluate the headaches Symptomatic treatment at this time

## 2018-03-09 NOTE — Assessment & Plan Note (Signed)
Chronic sleep issues Works second shift Discussed the importance of good sleep hygiene-same bedtime and wake time Trial of trazodone 50 mg tonight, if not effective will try 100 mg in the next 2 nights-if effective we will continue If this is not effective we will consider Lunesta or Ambien Discussed potential risks of taking sleep medication long-term including memory concerns and addiction

## 2018-03-09 NOTE — Assessment & Plan Note (Signed)
Probably tendinitis Discussed continuing to wear the brace Ice NSAIDs Will refer to sports medicine consider steroid injection

## 2018-03-14 ENCOUNTER — Ambulatory Visit: Payer: 59 | Admitting: Family Medicine

## 2018-03-15 NOTE — Progress Notes (Signed)
Subjective:    Patient ID: Maria Barker, female    DOB: January 18, 1961, 57 y.o.   MRN: [id number]  HPI The patient is here for an acute visit.   Fatigue:  She feels tired and has no energy due to not sleeping.  She thinks her lack of energy is related to not sleeping, but would like other things ruled out.  Headaches:  She has headaches when she is not sleeping, but they improve when she sleeps.    Not sleeping:  She took trazodone 50 mg and felt dizzy.  She only slept a couple of hours. She did not want to try two pills due to the dizziness.  She has tried melatonin and it made her dizzy and it did not help her sleep.  She is only getting a couple of hours of sleep a night.    Hypothyroidism:  She is taking her medication daily.  She is chronically tired due to not sleeping.  She would like to have her thyroid checked to make sure it is okay.  Prediabetes:  She is compliant with a low sugar/carbohydrate diet.  She is not exercising regularly.  B12 deficiency: She is taking her B12 vitamin daily.  Rash: For approximately 7 weeks she has had a rash in the back of her head that is itchy.  She is unsure what the cause but wondered what she could put on it.   Medications and allergies reviewed with patient and updated if appropriate.  Patient Active Problem List   Diagnosis Date Noted  . De Quervain's tenosynovitis, right 03/09/2018  . Chronic nonintractable headache 03/09/2018  . Insomnia 03/09/2018  . Pain of upper abdomen 01/17/2018  . Prediabetes 09/17/2017  . Chronic pain of left knee 04/12/2017  . B12 deficiency 08/13/2016  . S/P bilateral hip replacements 08/13/2016  . 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 Primary osteoarthritis of left hip 03/07/2016  . Cephalalgia 08/12/2015  . Family history of brain aneurysm 08/12/2015  . Gastroesophageal reflux disease without esophagitis 08/12/2015  . Hypothyroid 02/06/2015  . Depression   . Hyperlipidemia  with target LDL less than 130   . Osteoarthritis, hip, bilateral 04/13/2013  . Chronic insomnia 04/13/2013    Current Outpatient Medications on File Prior to Visit  Medication Sig Dispense Refill  . atorvastatin (LIPITOR) 10 MG tablet Take 1 tablet (10 mg total) by mouth daily. Need office visit with labs for further refills 90 tablet 3  . Calcium Carbonate-Vitamin D (CALCIUM-VITAMIN D3 PO) Take 1 each by mouth 2 (two) times daily.    Marland Kitchen levothyroxine (SYNTHROID, LEVOTHROID) 50 MCG tablet TAKE ONE TABLET BY MOUTH ONCE DAILY BEFORE  BREAKFAST 90 tablet 3  . promethazine (PHENERGAN) 25 MG tablet Take 1 tablet (25 mg total) by mouth every 8 (eight) hours as needed for nausea or vomiting. 30 tablet 0  . ranitidine (ZANTAC) 150 MG tablet Take 1 tablet (150 mg total) by mouth 2 (two) times daily. 180 tablet 3  . vitamin B-12 (CYANOCOBALAMIN) 1000 MCG tablet Take 1,000 mcg by mouth daily.     No current facility-administered medications on file prior to visit.     Past Medical History:  Diagnosis Date  . Arthritis    R THR 2011  . Back pain   . Frequent headaches   . Hyperlipidemia LDL goal < 130   . Hypothyroid 02/06/2015   Dx 01/2015: TSH 5.4  . Insomnia   . Left hip pain   .  Pain of left thigh     Past Surgical History:  Procedure Laterality Date  . 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 TOTAL HIP ARTHROPLASTY Left 03/08/2016   Procedure: TOTAL HIP ARTHROPLASTY;  Surgeon: Frederik Pear, MD;  Location: [facility];  Service: Orthopedics;  Laterality: Left;  . TUBAL LIGATION    . UPPER GASTROINTESTINAL ENDOSCOPY      Social History   Socioeconomic History  . Marital status: Married    Spouse name: Not on file  . Number of children: 5  . Years of education: Not on file  . Highest education level: Not on file  Occupational History  . Occupation: Glass blower/designer    Employer: [employer]  Social Needs  . Financial resource strain: Not on file  . Food insecurity:    Worry:  Not on file    Inability: Not on file  . Transportation needs:    Medical: Not on file    Non-medical: Not on file  Tobacco Use  . Smoking status: Never Smoker  . Smokeless tobacco: Never Used  Substance and Sexual Activity  . Alcohol use: No  . Drug use: No  . Sexual activity: Not on file  Lifestyle  . Physical activity:    Days per week: Not on file    Minutes per session: Not on file  . Stress: Not on file  Relationships  . Social connections:    Talks on phone: Not on file    Gets together: Not on file    Attends religious service: Not on file    Active member of club or organization: Not on file    Attends meetings of clubs or organizations: Not on file    Relationship status: Not on file  Other Topics Concern  . Not on file  Social History Narrative  . Not on file    Family History  Problem Relation Age of Onset  . CVA Mother   . Hypertension Mother     Review of Systems  Constitutional: Positive for fatigue.  HENT: Positive for congestion (mild) and sore throat (this morning only).   Respiratory: Positive for cough (dry cough). Negative for shortness of breath and wheezing.   Cardiovascular: Negative for chest pain, palpitations and leg swelling.  Gastrointestinal: Negative for abdominal pain and nausea.  Musculoskeletal: Positive for arthralgias (occ knees) and back pain (lower ).  Neurological: Positive for dizziness, light-headedness and headaches. Negative for weakness and numbness.       Objective:   Vitals:   03/16/18 0942  BP: 138/86  Pulse: 70  Resp: 16  Temp: 97.7 F (36.5 C)  SpO2: 97%   Wt Readings from Last 3 Encounters:  03/16/18 129 lb (58.5 kg)  03/09/18 125 lb (56.7 kg)  01/17/18 125 lb (56.7 kg)   Body mass index is 26.05 kg/m.   Physical Exam    Constitutional: Appears well-developed and well-nourished. No distress.  HENT:  Head: Normocephalic and atraumatic.  Neck: Neck supple. No tracheal deviation present. No  thyromegaly present.  No cervical lymphadenopathy Cardiovascular: Normal rate, regular rhythm and normal heart sounds.   No murmur heard. No carotid bruit .  No edema Pulmonary/Chest: Effort normal and breath sounds normal. No respiratory distress. No has no wheezes. No rales.  Skin: Skin is warm and dry. Not diaphoretic. Dry, scaly rash right lower neck along the hairline that is circular in nature Psychiatric: Normal mood and affect. Behavior is normal.  Assessment & Plan:    See Problem List for Assessment and Plan of chronic medical problems.

## 2018-03-16 ENCOUNTER — Other Ambulatory Visit (INDEPENDENT_AMBULATORY_CARE_PROVIDER_SITE_OTHER): Payer: 59

## 2018-03-16 ENCOUNTER — Ambulatory Visit: Payer: 59 | Admitting: Internal Medicine

## 2018-03-16 ENCOUNTER — Encounter: Payer: Self-pay | Admitting: Internal Medicine

## 2018-03-16 ENCOUNTER — Telehealth: Payer: Self-pay | Admitting: Internal Medicine

## 2018-03-16 VITALS — BP 138/86 | HR 70 | Temp 97.7°F | Resp 16 | Wt 129.0 lb

## 2018-03-16 DIAGNOSIS — F5104 Psychophysiologic insomnia: Secondary | ICD-10-CM | POA: Diagnosis not present

## 2018-03-16 DIAGNOSIS — E538 Deficiency of other specified B group vitamins: Secondary | ICD-10-CM

## 2018-03-16 DIAGNOSIS — R51 Headache: Secondary | ICD-10-CM | POA: Diagnosis not present

## 2018-03-16 DIAGNOSIS — R7303 Prediabetes: Secondary | ICD-10-CM

## 2018-03-16 DIAGNOSIS — R5383 Other fatigue: Secondary | ICD-10-CM

## 2018-03-16 DIAGNOSIS — R519 Headache, unspecified: Secondary | ICD-10-CM

## 2018-03-16 DIAGNOSIS — R21 Rash and other nonspecific skin eruption: Secondary | ICD-10-CM | POA: Diagnosis not present

## 2018-03-16 DIAGNOSIS — E038 Other specified hypothyroidism: Secondary | ICD-10-CM | POA: Diagnosis not present

## 2018-03-16 LAB — CBC WITH DIFFERENTIAL/PLATELET
BASOS PCT: 0.9 % (ref 0.0–3.0)
Basophils Absolute: 0.1 10*3/uL (ref 0.0–0.1)
EOS ABS: 1.4 10*3/uL — AB (ref 0.0–0.7)
EOS PCT: 15 % — AB (ref 0.0–5.0)
HEMATOCRIT: 37.3 % (ref 36.0–46.0)
Hemoglobin: 12.1 g/dL (ref 12.0–15.0)
LYMPHS PCT: 18.8 % (ref 12.0–46.0)
Lymphs Abs: 1.8 10*3/uL (ref 0.7–4.0)
MCHC: 32.5 g/dL (ref 30.0–36.0)
MCV: 83 fl (ref 78.0–100.0)
MONOS PCT: 7 % (ref 3.0–12.0)
Monocytes Absolute: 0.7 10*3/uL (ref 0.1–1.0)
Neutro Abs: 5.6 10*3/uL (ref 1.4–7.7)
Neutrophils Relative %: 58.3 % (ref 43.0–77.0)
Platelets: 392 10*3/uL (ref 150.0–400.0)
RBC: 4.49 Mil/uL (ref 3.87–5.11)
RDW: 14.2 % (ref 11.5–15.5)
WBC: 9.6 10*3/uL (ref 4.0–10.5)

## 2018-03-16 LAB — COMPREHENSIVE METABOLIC PANEL
ALK PHOS: 70 U/L (ref 39–117)
ALT: 37 U/L — ABNORMAL HIGH (ref 0–35)
AST: 30 U/L (ref 0–37)
Albumin: 4.4 g/dL (ref 3.5–5.2)
BUN: 11 mg/dL (ref 6–23)
CO2: 26 mEq/L (ref 19–32)
Calcium: 9.5 mg/dL (ref 8.4–10.5)
Chloride: 102 mEq/L (ref 96–112)
Creatinine, Ser: 0.63 mg/dL (ref 0.40–1.20)
GFR: 104.37 mL/min (ref >60.00)
Glucose, Bld: 96 mg/dL (ref 70–99)
POTASSIUM: 4.1 meq/L (ref 3.5–5.1)
Sodium: 135 mEq/L (ref 135–145)
TOTAL PROTEIN: 8 g/dL (ref 6.0–8.3)
Total Bilirubin: 0.3 mg/dL (ref 0.2–1.2)

## 2018-03-16 LAB — TSH: TSH: 3.31 u[IU]/mL (ref 0.35–4.50)

## 2018-03-16 LAB — VITAMIN B12: Vitamin B-12: 1341 pg/mL — ABNORMAL HIGH (ref 211–911)

## 2018-03-16 LAB — HEMOGLOBIN A1C: Hgb A1c MFr Bld: 6.2 % (ref 4.6–6.5)

## 2018-03-16 MED ORDER — TERBINAFINE HCL 1 % EX CREA: 1.0000 "application " | TOPICAL_CREAM | Freq: Two times a day (BID) | CUTANEOUS | 0 refills | Status: DC

## 2018-03-16 MED ORDER — ESZOPICLONE 2 MG PO TABS: 2.0000 mg | ORAL_TABLET | Freq: Every evening | ORAL | 5 refills | Status: DC | PRN

## 2018-03-16 NOTE — Assessment & Plan Note (Signed)
Taking B12 daily Check B12 level 

## 2018-03-16 NOTE — Assessment & Plan Note (Signed)
Extreme fatigue She has extremely poor sleep and states she is only getting 2 hours a night, which is likely the primary cause We will check blood work to rule out other causes including CBC, CMP, TSH, B12

## 2018-03-16 NOTE — Assessment & Plan Note (Signed)
Trazodone made her dizzy Has tried many other medications and they have not been successful We will try Lunesta 2 mg nightly Follow-up in 3 weeks

## 2018-03-16 NOTE — Assessment & Plan Note (Signed)
Chronic headaches likely secondary to poor sleep Will work on improving sleep-if headaches persist will need to have further evaluation Follow-up in 3 weeks

## 2018-03-16 NOTE — Telephone Encounter (Signed)
Copied from [place]. Topic: Quick Communication - See Telephone Encounter >> Mar 16, 2018 12:58 PM Hewitt Shorts wrote: CRM for notification. See Telephone encounter for: 03/16/18.pt called to say that the pharmacy is stating that they did not get the rx for the lamisil that was escrbed at 10:22 am to [facility] number [phone]

## 2018-03-16 NOTE — Assessment & Plan Note (Signed)
Check A1c Low sugar/carb hydrate diet

## 2018-03-16 NOTE — Patient Instructions (Addendum)
For your allergies - try taking Claritin in the morning or zyrtec at night.   Test(s) ordered today. Your results will be released to [place] (or called to you) after review, usually within 72hours after test completion. If any changes need to be made, you will be notified at that same time.   Medications reviewed and updated.  Stop the trazodone.    Start lunesta at night for sleep - take at the same time Try the cream on your rash - use twice daily for 4 weeks.    Your prescription(s) have been submitted to your pharmacy. Please take as directed and contact our office if you believe you are having problem(s) with the medication(s).   Please followup in 3 weeks

## 2018-03-16 NOTE — Telephone Encounter (Signed)
Spoke with Maudie Mercury at [facility] regarding prescription; she states that it is listed on the pt medication profile but it can be purchased over the counter; she further states that the pt should come to the pharmacy window for assistance; will attempt to contact pt to update her on the medication status.

## 2018-03-16 NOTE — Telephone Encounter (Signed)
Pt returned call. Message given to pt that lamisil is OTC, but to go to the pharmacy assistance window when she goes to pick it up. Instructed pt to call ahead and speak to a pharmacist regarding the Lamisil first before she makes the drive to the pharmacy.

## 2018-03-16 NOTE — Telephone Encounter (Signed)
Attempted to contact pt regarding her prescription status; left message on voice mail [phone].

## 2018-03-16 NOTE — Telephone Encounter (Signed)
Copied from [place]. Topic: Quick Communication - See Telephone Encounter >> Mar 16, 2018 12:58 PM Hewitt Shorts wrote: CRM for notification. See Telephone encounter for: 03/16/18.pt called to say that the pharmacy is stating that they did not get the rx for the lamisil that was escrbed at 10:22 am to [facility] number [phone]

## 2018-03-16 NOTE — Assessment & Plan Note (Signed)
Extremely fatigued She is extremely poor sleep, which is definitely contributing, but we need to rule out the thyroid is contributing as well Check TSH Titrate medication dose if necessary

## 2018-03-16 NOTE — Assessment & Plan Note (Signed)
Right lower neck and hairline Possible tinea Trial of terbinafine cream

## 2018-03-21 ENCOUNTER — Ambulatory Visit: Payer: 59 | Admitting: Family Medicine

## 2018-03-21 ENCOUNTER — Encounter: Payer: Self-pay | Admitting: Family Medicine

## 2018-03-21 DIAGNOSIS — M654 Radial styloid tenosynovitis [de Quervain]: Secondary | ICD-10-CM | POA: Diagnosis not present

## 2018-03-21 NOTE — Progress Notes (Signed)
Tashica Provencio - 57 y.o. female MRN [id number]  Date of birth: 10/21/1961  SUBJECTIVE:  Including CC & ROS.  Chief Complaint  Patient presents with  . Right wrist pain    Keyonni Percival is a 57 y.o. female that is presenting with right wrist pain. Ongoing for two months. She remembers slipping down the stairs and tried grabbing onto a stair handle. Her thumb banged and she noticed the pain afterwards. Pain is located on the radial aspect of her wrist. Pain is constant. She has been wearing a brace and taking tylenol. Pain is worse with the repetitive maneuvers she performs at work. Pain is localized. No altered sensation.      Review of Systems  Constitutional: Negative for fever.  HENT: Negative for congestion.   Respiratory: Negative for cough.   Cardiovascular: Negative for chest pain.  Gastrointestinal: Negative for abdominal pain.  Musculoskeletal: Negative for joint swelling.  Skin: Negative for color change.  Allergic/Immunologic: Negative for immunocompromised state.  Neurological: Negative for weakness.  Hematological: Negative for adenopathy.  Psychiatric/Behavioral: Negative for agitation.    HISTORY: Past Medical, Surgical, Social, and Family History Reviewed & Updated per EMR.   Pertinent Historical Findings include:  Past Medical History:  Diagnosis Date  . Arthritis    R THR 2011  . Back pain   . Frequent headaches   . Hyperlipidemia LDL goal < 130   . Hypothyroid 02/06/2015   Dx 01/2015: TSH 5.4  . Insomnia   . Left hip pain   . Pain of left thigh     Past Surgical History:  Procedure Laterality Date  . 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 TOTAL HIP ARTHROPLASTY Left 03/08/2016   Procedure: TOTAL HIP ARTHROPLASTY;  Surgeon: Frederik Pear, MD;  Location: [facility];  Service: Orthopedics;  Laterality: Left;  . TUBAL LIGATION    . UPPER GASTROINTESTINAL ENDOSCOPY      Allergies  Allergen Reactions  . Asa [Aspirin] Nausea And Vomiting    UPSET STOMACH      Family History  Problem Relation Age of Onset  . CVA Mother   . Hypertension Mother      Social History   Socioeconomic History  . Marital status: Married    Spouse name: Not on file  . Number of children: 5  . Years of education: Not on file  . Highest education level: Not on file  Occupational History  . Occupation: Glass blower/designer    Employer: [employer]  Social Needs  . Financial resource strain: Not on file  . Food insecurity:    Worry: Not on file    Inability: Not on file  . Transportation needs:    Medical: Not on file    Non-medical: Not on file  Tobacco Use  . Smoking status: Never Smoker  . Smokeless tobacco: Never Used  Substance and Sexual Activity  . Alcohol use: No  . Drug use: No  . Sexual activity: Not on file  Lifestyle  . Physical activity:    Days per week: Not on file    Minutes per session: Not on file  . Stress: Not on file  Relationships  . Social connections:    Talks on phone: Not on file    Gets together: Not on file    Attends religious service: Not on file    Active member of club or organization: Not on file    Attends meetings of clubs or organizations: Not on file  Relationship status: Not on file  . Intimate partner violence:    Fear of current or ex partner: Not on file    Emotionally abused: Not on file    Physically abused: Not on file    Forced sexual activity: Not on file  Other Topics Concern  . Not on file  Social History Narrative  . Not on file     PHYSICAL EXAM:  VS: BP 122/64 (BP Location: Left Arm, Patient Position: Sitting, Cuff Size: Normal)   Pulse 72   Temp 97.6 F (36.4 C) (Oral)   Ht 4\' 11"  (1.499 m)   Wt 128 lb (58.1 kg)   SpO2 99%   BMI 25.85 kg/m  Physical Exam Gen: NAD, alert, cooperative with exam, well-appearing ENT: normal lips, normal nasal mucosa,  Eye: normal EOM, normal conjunctiva and lids CV:  no edema, +2 pedal pulses   Resp: no accessory muscle use, non-labored,   Skin: no rashes, no areas of induration  Neuro: normal tone, normal sensation to touch Psych:  normal insight, alert and oriented MSK:  Right wrist:  TTP over the first dorsal compartment.  Normal pincer grasp  Normal finger abduction and adduction  Normal ROM  Normal strength to resistance with flexion and extension  Positive Finkelstein's test. Neurovascular intact  Limited ultrasound: right wrist:  Normal appearing CMC joint  Normal appearing 2nd dorsal compartment  1st dorsal compartment with hypoechoic change around tendon to suggest a tenosynovitis  Normal appearing distal radius   Summary: DeQuervain's tenosynovitis   Ultrasound and interpretation by Clearance Coots, MD       Aspiration/Injection Procedure Note [name] 1961/04/05  Procedure: Injection Indications: right wrist pain   Procedure Details Consent: Risks of procedure as well as the alternatives and risks of each were explained to the (patient/caregiver).  Consent for procedure obtained. Time Out: Verified patient identification, verified procedure, site/side was marked, verified correct patient position, special equipment/implants available, medications/allergies/relevent history reviewed, required imaging and test results available.  Performed.  The area was cleaned with iodine and alcohol swabs.    The right first compartment was injected using 0.5 cc's of 40 mg Depomedrol and 1 cc's of 1% lidocaine with a 25 1" needle.  Ultrasound was used. Images were obtained in Long views showing the injection.    A sterile dressing was applied.  Patient did tolerate procedure well.       ASSESSMENT & PLAN:   De Quervain's tenosynovitis, right Pain has been ongoing.  - injection today  - counseled on HEP  - f/u if no improvement. Consider imaging and PT

## 2018-03-21 NOTE — Patient Instructions (Signed)
Please try to ice the area  Please try to wear the brace at work  Please try th exercises if the pain is less than 2/10  Please follow up with me in 4 weeks if your pain hasn't improved.

## 2018-03-21 NOTE — Assessment & Plan Note (Signed)
Pain has been ongoing.  - injection today  - counseled on HEP  - f/u if no improvement. Consider imaging and PT

## 2018-03-30 ENCOUNTER — Encounter: Payer: Self-pay | Admitting: Internal Medicine

## 2018-03-30 DIAGNOSIS — R21 Rash and other nonspecific skin eruption: Secondary | ICD-10-CM

## 2018-03-30 MED ORDER — MUPIROCIN 2 % EX OINT: 1.0000 "application " | TOPICAL_OINTMENT | Freq: Two times a day (BID) | CUTANEOUS | 0 refills | Status: DC

## 2018-04-05 NOTE — Patient Instructions (Addendum)
  Medications reviewed and updated.  Changes include stopping the [place] and starting [place] again.  Stop your current cream on the rash and try the new cream - a steroid cream.  Your prescription(s) have been submitted to your pharmacy. Please take as directed and contact our office if you believe you are having problem(s) with the medication(s).   Please followup in 3 months

## 2018-04-05 NOTE — Progress Notes (Signed)
Subjective:    Patient ID: Maria Barker, female    DOB: [birth year], 57 y.o.   MRN: [id number]  HPI The patient is here for follow up.  Insomnia:  She is taking lunesta nightly as prescribed.  The medication make her mouth bitter.  She sleeps 3 hours when she takes the medication.  Sometimes she goes back to sleep.  She has taken [place] in the past and it was effective - she did not have side effects.  She has not taken it in a while.   Rash, right posterior neck: She tried Lamisil Cream, but it did not help the rash and it did cause it to sting.  The rash continued to get worse and she contacted me via my chart.  I advised that she discontinue it and start Bactroban. There is no improvement.  She sees derm on may 1.   Medications and allergies reviewed with patient and updated if appropriate.  Patient Active Problem List   Diagnosis Date Noted  . Fatigue 03/16/2018  . Rash and nonspecific skin eruption 03/16/2018  . De Quervain's tenosynovitis, right 03/09/2018  . Chronic nonintractable headache 03/09/2018  . Pain of upper abdomen 01/17/2018  . Prediabetes 09/17/2017  . Chronic pain of left knee 04/12/2017  . B12 deficiency 08/13/2016  . S/P bilateral hip replacements 08/13/2016  . Cephalalgia 08/12/2015  . Family history of brain aneurysm 08/12/2015  . Gastroesophageal reflux disease without esophagitis 08/12/2015  . Hypothyroid 02/06/2015  . Depression   . Hyperlipidemia with target LDL less than 130   . Chronic insomnia 04/13/2013    Current Outpatient Medications on File Prior to Visit  Medication Sig Dispense Refill  . atorvastatin (LIPITOR) 10 MG tablet Take 1 tablet (10 mg total) by mouth daily. Need office visit with labs for further refills 90 tablet 3  . Calcium Carbonate-Vitamin D (CALCIUM-VITAMIN D3 PO) Take 1 each by mouth 2 (two) times daily.    Marland Kitchen levothyroxine (SYNTHROID, LEVOTHROID) 50 MCG tablet TAKE ONE TABLET BY MOUTH ONCE DAILY BEFORE  BREAKFAST 90 tablet  3  . promethazine (PHENERGAN) 25 MG tablet Take 1 tablet (25 mg total) by mouth every 8 (eight) hours as needed for nausea or vomiting. 30 tablet 0  . ranitidine (ZANTAC) 150 MG tablet Take 1 tablet (150 mg total) by mouth 2 (two) times daily. 180 tablet 3  . vitamin B-12 (CYANOCOBALAMIN) 1000 MCG tablet Take 1,000 mcg by mouth daily.     No current facility-administered medications on file prior to visit.     Past Medical History:  Diagnosis Date  . Arthritis    R THR 2011  . Back pain   . Frequent headaches   . Hyperlipidemia LDL goal < 130   . Hypothyroid 02/06/2015   Dx 01/2015: TSH 5.4  . Insomnia   . Left hip pain   . Pain of left thigh     Past Surgical History:  Procedure Laterality Date  . 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 TOTAL HIP ARTHROPLASTY Left 03/08/2016   Procedure: TOTAL HIP ARTHROPLASTY;  Surgeon: Frederik Pear, MD;  Location: [facility];  Service: Orthopedics;  Laterality: Left;  . TUBAL LIGATION    . UPPER GASTROINTESTINAL ENDOSCOPY      Social History   Socioeconomic History  . Marital status: Married    Spouse name: Not on file  . Number of children: 5  . Years of education: Not on file  . Highest education level:  Not on file  Occupational History  . Occupation: Glass blower/designer    Employer: [employer]  Social Needs  . Financial resource strain: Not on file  . Food insecurity:    Worry: Not on file    Inability: Not on file  . Transportation needs:    Medical: Not on file    Non-medical: Not on file  Tobacco Use  . Smoking status: Never Smoker  . Smokeless tobacco: Never Used  Substance and Sexual Activity  . Alcohol use: No  . Drug use: No  . Sexual activity: Not on file  Lifestyle  . Physical activity:    Days per week: Not on file    Minutes per session: Not on file  . Stress: Not on file  Relationships  . Social connections:    Talks on phone: Not on file    Gets together: Not on file    Attends religious service:  Not on file    Active member of club or organization: Not on file    Attends meetings of clubs or organizations: Not on file    Relationship status: Not on file  Other Topics Concern  . Not on file  Social History Narrative  . Not on file    Family History  Problem Relation Age of Onset  . CVA Mother   . Hypertension Mother     Review of Systems  Constitutional: Negative for chills and fever.  Skin: Positive for rash.  Psychiatric/Behavioral: Positive for sleep disturbance. Negative for dysphoric mood. The patient is not nervous/anxious.        Objective:   Vitals:   04/06/18 0925  BP: 140/74  Pulse: 68  Resp: 16  Temp: 97.8 F (36.6 C)  SpO2: 98%   BP Readings from Last 3 Encounters:  04/06/18 140/74  03/21/18 122/64  03/16/18 138/86   Wt Readings from Last 3 Encounters:  04/06/18 126 lb 12.8 oz (57.5 kg)  03/21/18 128 lb (58.1 kg)  03/16/18 129 lb (58.5 kg)   Body mass index is 25.61 kg/m.   Physical Exam  Constitutional: She appears well-developed and well-nourished. No distress.  HENT:  Head: Normocephalic and atraumatic.  Skin: Skin is warm and dry. Rash (erythema on right that extends to the left side of her posterior neck and toward her left anterior neck - several papules, no blisters, open wounds or dryness) noted. She is not diaphoretic.  Psychiatric: She has a normal mood and affect. Her behavior is normal. Judgment and thought content normal.           Assessment & Plan:    See Problem List for Assessment and Plan of chronic medical problems.

## 2018-04-06 ENCOUNTER — Encounter: Payer: Self-pay | Admitting: Internal Medicine

## 2018-04-06 ENCOUNTER — Ambulatory Visit: Payer: 59 | Admitting: Internal Medicine

## 2018-04-06 VITALS — BP 140/74 | HR 68 | Temp 97.8°F | Resp 16 | Ht 59.0 in | Wt 126.8 lb

## 2018-04-06 DIAGNOSIS — F5104 Psychophysiologic insomnia: Secondary | ICD-10-CM | POA: Diagnosis not present

## 2018-04-06 DIAGNOSIS — R21 Rash and other nonspecific skin eruption: Secondary | ICD-10-CM | POA: Diagnosis not present

## 2018-04-06 MED ORDER — CLOBETASOL PROPIONATE 0.05 % EX CREA: 1.0000 "application " | TOPICAL_CREAM | Freq: Two times a day (BID) | CUTANEOUS | 0 refills | Status: DC

## 2018-04-06 MED ORDER — ZOLPIDEM TARTRATE 5 MG PO TABS: 5.0000 mg | ORAL_TABLET | Freq: Every evening | ORAL | 1 refills | Status: DC | PRN

## 2018-04-06 NOTE — Assessment & Plan Note (Signed)
lunesta 2 mg - gets her about three hrs of sleep - has a bitter taste that lasts all day Has been on ambien in the past - will retry - did not have side effects

## 2018-04-06 NOTE — Assessment & Plan Note (Signed)
Getting worse Tried anti-fungal and bactroban -no improvement Will try clobetasol cream Has derm appt may 1st

## 2018-04-12 ENCOUNTER — Encounter: Payer: Self-pay | Admitting: Internal Medicine

## 2018-05-12 DIAGNOSIS — Z6825 Body mass index (BMI) 25.0-25.9, adult: Secondary | ICD-10-CM | POA: Diagnosis not present

## 2018-05-12 DIAGNOSIS — Z01419 Encounter for gynecological examination (general) (routine) without abnormal findings: Secondary | ICD-10-CM | POA: Diagnosis not present

## 2018-05-12 DIAGNOSIS — Z13820 Encounter for screening for osteoporosis: Secondary | ICD-10-CM | POA: Diagnosis not present

## 2018-05-12 LAB — HM MAMMOGRAPHY

## 2018-05-12 LAB — HM PAP SMEAR: HM Pap smear: NEGATIVE

## 2018-06-02 DIAGNOSIS — M8580 Other specified disorders of bone density and structure, unspecified site: Secondary | ICD-10-CM | POA: Diagnosis not present

## 2018-07-02 NOTE — Patient Instructions (Addendum)
  Test(s) ordered today. Your results will be released to [facility] (or called to you) after review, usually within 72hours after test completion. If any changes need to be made, you will be notified at that same time.   Medications reviewed and updated.  Changes include taking omeprazole for your stomach for 2 weeks and then stopping - take this medication as needed for heartburn flares.   Your prescription(s) have been submitted to your pharmacy. Please take as directed and contact our office if you believe you are having problem(s) with the medication(s).   Please followup in 6 months

## 2018-07-02 NOTE — Progress Notes (Signed)
Subjective:    Patient ID: Maria Barker, female    DOB: 10-16-1961, 57 y.o.   MRN: [id number]  HPI The patient is here for follow up.  Hypothyroidism:  She is taking her medication daily.  She denies any recent changes in energy or weight that are unexplained.   Hyperlipidemia: She is taking her medication daily. She is compliant with a low fat/cholesterol diet. She is exercising some. She denies myalgias.   Insomnia:  She is taking ambien some nights - not nightly.  On average she gets 4 hours with ambien.  She sometimes feels tired throughout the day.      Prediabetes:  She is compliant with a low sugar/carbohydrate diet.  She is exercising some.   Epigastric pain:  She is having pain in her epigastric region for the past three days.  She does have GERD and when she has the pain she takes the zantac, which helps.  Drinking water also helps.    Medications and allergies reviewed with patient and updated if appropriate.  Patient Active Problem List   Diagnosis Date Noted  . Fatigue 03/16/2018  . Rash and nonspecific skin eruption 03/16/2018  . De Quervain's tenosynovitis, right 03/09/2018  . Chronic nonintractable headache 03/09/2018  . Pain of upper abdomen 01/17/2018  . Prediabetes 09/17/2017  . Chronic pain of left knee 04/12/2017  . B12 deficiency 08/13/2016  . S/P bilateral hip replacements 08/13/2016  . Cephalalgia 08/12/2015  . Family history of brain aneurysm 08/12/2015  . Gastroesophageal reflux disease without esophagitis 08/12/2015  . Hypothyroid 02/06/2015  . Depression   . Hyperlipidemia with target LDL less than 130   . Chronic insomnia 04/13/2013    Current Outpatient Medications on File Prior to Visit  Medication Sig Dispense Refill  . atorvastatin (LIPITOR) 10 MG tablet Take 1 tablet (10 mg total) by mouth daily. Need office visit with labs for further refills 90 tablet 3  . Calcium Carbonate-Vitamin D (CALCIUM-VITAMIN D3 PO) Take 1 each by mouth 2 (two)  times daily.    . clobetasol cream (TEMOVATE) 3.71 % Apply 1 application topically 2 (two) times daily. 30 g 0  . levothyroxine (SYNTHROID, LEVOTHROID) 50 MCG tablet TAKE ONE TABLET BY MOUTH ONCE DAILY BEFORE  BREAKFAST 90 tablet 3  . promethazine (PHENERGAN) 25 MG tablet Take 1 tablet (25 mg total) by mouth every 8 (eight) hours as needed for nausea or vomiting. 30 tablet 0  . ranitidine (ZANTAC) 150 MG tablet Take 1 tablet (150 mg total) by mouth 2 (two) times daily. 180 tablet 3  . vitamin B-12 (CYANOCOBALAMIN) 1000 MCG tablet Take 1,000 mcg by mouth daily.    Marland Kitchen zolpidem (AMBIEN) 5 MG tablet Take 1 tablet (5 mg total) by mouth at bedtime as needed for sleep. 30 tablet 1   No current facility-administered medications on file prior to visit.     Past Medical History:  Diagnosis Date  . Arthritis    R THR 2011  . Back pain   . Frequent headaches   . Hyperlipidemia LDL goal < 130   . Hypothyroid 02/06/2015   Dx 01/2015: TSH 5.4  . Insomnia   . Left hip pain   . Pain of left thigh     Past Surgical History:  Procedure Laterality Date  . 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 TOTAL HIP ARTHROPLASTY Left 03/08/2016   Procedure: TOTAL HIP ARTHROPLASTY;  Surgeon: Frederik Pear, MD;  Location: [facility];  Service:  Orthopedics;  Laterality: Left;  . TUBAL LIGATION    . UPPER GASTROINTESTINAL ENDOSCOPY      Social History   Socioeconomic History  . Marital status: Married    Spouse name: Not on file  . Number of children: 5  . Years of education: Not on file  . Highest education level: Not on file  Occupational History  . Occupation: Glass blower/designer    Employer: [employer]  Social Needs  . Financial resource strain: Not on file  . Food insecurity:    Worry: Not on file    Inability: Not on file  . Transportation needs:    Medical: Not on file    Non-medical: Not on file  Tobacco Use  . Smoking status: Never Smoker  . Smokeless tobacco: Never Used  Substance and  Sexual Activity  . Alcohol use: No  . Drug use: No  . Sexual activity: Not on file  Lifestyle  . Physical activity:    Days per week: Not on file    Minutes per session: Not on file  . Stress: Not on file  Relationships  . Social connections:    Talks on phone: Not on file    Gets together: Not on file    Attends religious service: Not on file    Active member of club or organization: Not on file    Attends meetings of clubs or organizations: Not on file    Relationship status: Not on file  Other Topics Concern  . Not on file  Social History Narrative  . Not on file    Family History  Problem Relation Age of Onset  . CVA Mother   . Hypertension Mother     Review of Systems  Constitutional: Negative for chills and fever.  Respiratory: Negative for cough, shortness of breath and wheezing.   Cardiovascular: Negative for chest pain, palpitations and leg swelling.  Gastrointestinal: Positive for abdominal pain (epigastric - pain x 3 days, drinking water helps, zantac helps). Negative for blood in stool (no melena), constipation, diarrhea and nausea.       Gerd  Neurological: Positive for headaches (occ). Negative for light-headedness.       Objective:   Vitals:   07/03/18 0926  BP: 128/76  Pulse: 94  Resp: 16  Temp: 97.8 F (36.6 C)  SpO2: 98%   BP Readings from Last 3 Encounters:  07/03/18 128/76  04/06/18 140/74  03/21/18 122/64   Wt Readings from Last 3 Encounters:  07/03/18 124 lb (56.2 kg)  04/06/18 126 lb 12.8 oz (57.5 kg)  03/21/18 128 lb (58.1 kg)   Body mass index is 25.04 kg/m.   Physical Exam    Constitutional: Appears well-developed and well-nourished. No distress.  HENT:  Head: Normocephalic and atraumatic.  Neck: Neck supple. No tracheal deviation present. No thyromegaly present.  No cervical lymphadenopathy Cardiovascular: Normal rate, regular rhythm and normal heart sounds.   No murmur heard. No carotid bruit .  No  edema Pulmonary/Chest: Effort normal and breath sounds normal. No respiratory distress. No has no wheezes. No rales.  Abdomen:  Soft, ND, tender in epigastric region w/o rebound or guarding Skin: Skin is warm and dry. Not diaphoretic.  Psychiatric: Normal mood and affect. Behavior is normal.      Assessment & Plan:    See Problem List for Assessment and Plan of chronic medical problems.

## 2018-07-03 ENCOUNTER — Other Ambulatory Visit (INDEPENDENT_AMBULATORY_CARE_PROVIDER_SITE_OTHER): Payer: 59

## 2018-07-03 ENCOUNTER — Ambulatory Visit: Payer: 59 | Admitting: Internal Medicine

## 2018-07-03 ENCOUNTER — Encounter: Payer: Self-pay | Admitting: Internal Medicine

## 2018-07-03 VITALS — BP 128/76 | HR 94 | Temp 97.8°F | Resp 16 | Wt 124.0 lb

## 2018-07-03 DIAGNOSIS — E785 Hyperlipidemia, unspecified: Secondary | ICD-10-CM | POA: Diagnosis not present

## 2018-07-03 DIAGNOSIS — F5104 Psychophysiologic insomnia: Secondary | ICD-10-CM

## 2018-07-03 DIAGNOSIS — E038 Other specified hypothyroidism: Secondary | ICD-10-CM

## 2018-07-03 DIAGNOSIS — R7303 Prediabetes: Secondary | ICD-10-CM

## 2018-07-03 LAB — COMPREHENSIVE METABOLIC PANEL
ALT: 18 U/L (ref 0–35)
AST: 23 U/L (ref 0–37)
Albumin: 4 g/dL (ref 3.5–5.2)
Alkaline Phosphatase: 62 U/L (ref 39–117)
BUN: 10 mg/dL (ref 6–23)
CO2: 26 meq/L (ref 19–32)
Calcium: 9.1 mg/dL (ref 8.4–10.5)
Chloride: 100 mEq/L (ref 96–112)
Creatinine, Ser: 0.59 mg/dL (ref 0.40–1.20)
GFR: 112.45 mL/min (ref >60.00)
GLUCOSE: 85 mg/dL (ref 70–99)
Potassium: 4.4 mEq/L (ref 3.5–5.1)
SODIUM: 132 meq/L — AB (ref 135–145)
TOTAL PROTEIN: 7.8 g/dL (ref 6.0–8.3)
Total Bilirubin: 0.4 mg/dL (ref 0.2–1.2)

## 2018-07-03 LAB — HEMOGLOBIN A1C: Hgb A1c MFr Bld: 6.1 % (ref 4.6–6.5)

## 2018-07-03 LAB — LIPID PANEL
CHOLESTEROL: 146 mg/dL (ref 0–200)
HDL: 63.5 mg/dL (ref >39.00)
LDL Cholesterol: 67 mg/dL (ref 0–99)
NonHDL: 82.2
TRIGLYCERIDES: 78 mg/dL (ref 0.0–149.0)
Total CHOL/HDL Ratio: 2
VLDL: 15.6 mg/dL (ref 0.0–40.0)

## 2018-07-03 LAB — TSH: TSH: 0.79 u[IU]/mL (ref 0.35–4.50)

## 2018-07-03 MED ORDER — OMEPRAZOLE 20 MG PO CPDR: 20.0000 mg | DELAYED_RELEASE_CAPSULE | Freq: Every day | ORAL | 5 refills | Status: DC

## 2018-07-03 NOTE — Assessment & Plan Note (Signed)
Check a1c Low sugar / carb diet Stressed regular exercise   

## 2018-07-03 NOTE — Assessment & Plan Note (Signed)
Check tsh  Titrate med dose if needed  

## 2018-07-03 NOTE — Assessment & Plan Note (Signed)
Check lipid panel  Continue daily statin Regular exercise and healthy diet encouraged  

## 2018-07-03 NOTE — Assessment & Plan Note (Signed)
Maria Barker helps some, but still only gets 4 hrs of sleep but states she is not tired every day so may be getting more than 4 hrs Does not take the medication nightly Will continue

## 2018-07-04 ENCOUNTER — Encounter: Payer: Self-pay | Admitting: Internal Medicine

## 2018-07-12 ENCOUNTER — Encounter: Payer: Self-pay | Admitting: Internal Medicine

## 2018-07-25 DIAGNOSIS — M1612 Unilateral primary osteoarthritis, left hip: Secondary | ICD-10-CM | POA: Diagnosis not present

## 2018-09-30 ENCOUNTER — Other Ambulatory Visit: Payer: Self-pay | Admitting: Internal Medicine

## 2018-10-11 NOTE — Progress Notes (Signed)
Subjective:    Patient ID: Maria Barker, female    DOB: [birth year], 57 y.o.   MRN: [id number]  HPI She is here for a physical exam.   She denies any changes since she was here last.  She is exercise and trying to eat healthy.  Her sleep is still poor and she is tired and has headaches when her sleep is very bad.    Medications and allergies reviewed with patient and updated if appropriate.  Patient Active Problem List   Diagnosis Date Noted  . Fatigue 03/16/2018  . Rash and nonspecific skin eruption 03/16/2018  . De Quervain's tenosynovitis, right 03/09/2018  . Chronic nonintractable headache 03/09/2018  . Pain of upper abdomen 01/17/2018  . Prediabetes 09/17/2017  . Chronic pain of left knee 04/12/2017  . B12 deficiency 08/13/2016  . S/P bilateral hip replacements 08/13/2016  . Cephalalgia 08/12/2015  . Family history of brain aneurysm 08/12/2015  . Gastroesophageal reflux disease without esophagitis 08/12/2015  . Hypothyroid 02/06/2015  . Hyperlipidemia with target LDL less than 130   . Chronic insomnia 04/13/2013    Current Outpatient Medications on File Prior to Visit  Medication Sig Dispense Refill  . atorvastatin (LIPITOR) 10 MG tablet TAKE 1 TABLET BY MOUTH ONCE DAILY 90 tablet 0  . Calcium Carbonate-Vitamin D (CALCIUM-VITAMIN D3 PO) Take 1 each by mouth 2 (two) times daily.    Marland Kitchen omeprazole (PRILOSEC) 20 MG capsule Take 1 capsule (20 mg total) by mouth daily. Take 30 minutes prior to a meal.  Take for 2 weeks with heartburn flare and then stop. 30 capsule 5  . ranitidine (ZANTAC) 150 MG tablet Take 1 tablet (150 mg total) by mouth 2 (two) times daily. 180 tablet 3  . vitamin B-12 (CYANOCOBALAMIN) 1000 MCG tablet Take 1,000 mcg by mouth daily.    Marland Kitchen zolpidem (AMBIEN) 5 MG tablet Take 1 tablet (5 mg total) by mouth at bedtime as needed for sleep. 30 tablet 1   No current facility-administered medications on file prior to visit.     Past Medical History:  Diagnosis  Date  . Arthritis    R THR 2011  . Back pain   . Frequent headaches   . Hyperlipidemia LDL goal < 130   . Hypothyroid 02/06/2015   Dx 01/2015: TSH 5.4  . Insomnia   . Left hip pain   . Pain of left thigh     Past Surgical History:  Procedure Laterality Date  . 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 TOTAL HIP ARTHROPLASTY Left 03/08/2016   Procedure: TOTAL HIP ARTHROPLASTY;  Surgeon: Frederik Pear, MD;  Location: [facility];  Service: Orthopedics;  Laterality: Left;  . TUBAL LIGATION    . UPPER GASTROINTESTINAL ENDOSCOPY      Social History   Socioeconomic History  . Marital status: Married    Spouse name: Not on file  . Number of children: 5  . Years of education: Not on file  . Highest education level: Not on file  Occupational History  . Occupation: Glass blower/designer    Employer: [employer]  Social Needs  . Financial resource strain: Not on file  . Food insecurity:    Worry: Not on file    Inability: Not on file  . Transportation needs:    Medical: Not on file    Non-medical: Not on file  Tobacco Use  . Smoking status: Never Smoker  . Smokeless tobacco: Never Used  Substance and Sexual  Activity  . Alcohol use: No  . Drug use: No  . Sexual activity: Not on file  Lifestyle  . Physical activity:    Days per week: Not on file    Minutes per session: Not on file  . Stress: Not on file  Relationships  . Social connections:    Talks on phone: Not on file    Gets together: Not on file    Attends religious service: Not on file    Active member of club or organization: Not on file    Attends meetings of clubs or organizations: Not on file    Relationship status: Not on file  Other Topics Concern  . Not on file  Social History Narrative  . Not on file    Family History  Problem Relation Age of Onset  . CVA Mother   . Hypertension Mother     Review of Systems  Constitutional: Positive for fatigue (some days). Negative for chills and fever.  Eyes:  Positive for visual disturbance (blurry at times, saw eye doctor - dry eyes).  Respiratory: Negative for cough, shortness of breath and wheezing.   Cardiovascular: Negative for chest pain, palpitations and leg swelling.  Gastrointestinal: Negative for abdominal pain (occ epigastric region - takes zantac as needed which helps), blood in stool, constipation, diarrhea and nausea.       No gerd  Genitourinary: Negative for dysuria and hematuria.  Musculoskeletal: Positive for arthralgias (knee) and back pain.  Skin: Negative for color change and rash.  Neurological: Positive for light-headedness (sometimes) and headaches (when she does not get enough sleep). Negative for dizziness.  Psychiatric/Behavioral: Positive for sleep disturbance. Negative for dysphoric mood. The patient is not nervous/anxious.        Objective:   Vitals:   10/12/18 0950  BP: 134/82  Pulse: (!) 54  Resp: 16  Temp: 97.6 F (36.4 C)  SpO2: 97%   Filed Weights   10/12/18 0950  Weight: 124 lb (56.2 kg)   Body mass index is 25.04 kg/m.  Wt Readings from Last 3 Encounters:  10/12/18 124 lb (56.2 kg)  07/03/18 124 lb (56.2 kg)  04/06/18 126 lb 12.8 oz (57.5 kg)     Physical Exam Constitutional: She appears well-developed and well-nourished. No distress.  HENT:  Head: Normocephalic and atraumatic.  Right Ear: External ear normal. Normal ear canal and TM Left Ear: External ear normal.  Normal ear canal and TM Mouth/Throat: Oropharynx is clear and moist.  Eyes: Conjunctivae and EOM are normal.  Neck: Neck supple. No tracheal deviation present. No thyromegaly present.  No carotid bruit  Cardiovascular: Normal rate, regular rhythm and normal heart sounds.   No murmur heard.  No edema. Pulmonary/Chest: Effort normal and breath sounds normal. No respiratory distress. She has no wheezes. She has no rales.  Breast: deferred to Gyn Abdominal: Soft. She exhibits no distension. There is no tenderness.    Lymphadenopathy: She has no cervical adenopathy.  Skin: Skin is warm and dry. She is not diaphoretic.  Psychiatric: She has a normal mood and affect. Her behavior is normal.        Assessment & Plan:   Physical exam: Screening blood work   reviewed Immunizations  Flu vaccine done already, discussed shingrix Colonoscopy   Up to date  Mammogram   Up to date  Gyn   Up to date  Eye exams   Up to date  EKG   Done 02/2016 Exercise  Exercises at home  30 minute/day Weight  Normal BMI Skin no concerns Substance abuse   none  See Problem List for Assessment and Plan of chronic medical problems.   FU in 6 months

## 2018-10-11 NOTE — Patient Instructions (Addendum)
Tests ordered today. Your results will be released to MyChart (or called to you) after review, usually within 72hours after test completion. If any changes need to be made, you will be notified at that same time.  All other Health Maintenance issues reviewed.   All recommended immunizations and age-appropriate screenings are up-to-date or discussed.  No immunizations administered today.   Medications reviewed and updated.  Changes include :   none    Please followup in 6 months   Health Maintenance, Female Adopting a healthy lifestyle and getting preventive care can go a long way to promote health and wellness. Talk with your health care provider about what schedule of regular examinations is right for you. This is a good chance for you to check in with your provider about disease prevention and staying healthy. In between checkups, there are plenty of things you can do on your own. Experts have done a lot of research about which lifestyle changes and preventive measures are most likely to keep you healthy. Ask your health care provider for more information. Weight and diet Eat a healthy diet  Be sure to include plenty of vegetables, fruits, low-fat dairy products, and lean protein.  Do not eat a lot of foods high in solid fats, added sugars, or salt.  Get regular exercise. This is one of the most important things you can do for your health. ? Most adults should exercise for at least 150 minutes each week. The exercise should increase your heart rate and make you sweat (moderate-intensity exercise). ? Most adults should also do strengthening exercises at least twice a week. This is in addition to the moderate-intensity exercise.  Maintain a healthy weight  Body mass index (BMI) is a measurement that can be used to identify possible weight problems. It estimates body fat based on height and weight. Your health care provider can help determine your BMI and help you achieve or maintain a  healthy weight.  For females 20 years of age and older: ? A BMI below 18.5 is considered underweight. ? A BMI of 18.5 to 24.9 is normal. ? A BMI of 25 to 29.9 is considered overweight. ? A BMI of 30 and above is considered obese.  Watch levels of cholesterol and blood lipids  You should start having your blood tested for lipids and cholesterol at 55 years of age, then have this test every 5 years.  You may need to have your cholesterol levels checked more often if: ? Your lipid or cholesterol levels are high. ? You are older than 55 years of age. ? You are at high risk for heart disease.  Cancer screening Lung Cancer  Lung cancer screening is recommended for adults 55-80 years old who are at high risk for lung cancer because of a history of smoking.  A yearly low-dose CT scan of the lungs is recommended for people who: ? Currently smoke. ? Have quit within the past 15 years. ? Have at least a 30-pack-year history of smoking. A pack year is smoking an average of one pack of cigarettes a day for 1 year.  Yearly screening should continue until it has been 15 years since you quit.  Yearly screening should stop if you develop a health problem that would prevent you from having lung cancer treatment.  Breast Cancer  Practice breast self-awareness. This means understanding how your breasts normally appear and feel.  It also means doing regular breast self-exams. Let your health care provider know about any   changes, no matter how small.  If you are in your 20s or 30s, you should have a clinical breast exam (CBE) by a health care provider every 1-3 years as part of a regular health exam.  If you are 40 or older, have a CBE every year. Also consider having a breast X-ray (mammogram) every year.  If you have a family history of breast cancer, talk to your health care provider about genetic screening.  If you are at high risk for breast cancer, talk to your health care provider about  having an MRI and a mammogram every year.  Breast cancer gene (BRCA) assessment is recommended for women who have family members with BRCA-related cancers. BRCA-related cancers include: ? Breast. ? Ovarian. ? Tubal. ? Peritoneal cancers.  Results of the assessment will determine the need for genetic counseling and BRCA1 and BRCA2 testing.  Cervical Cancer Your health care provider may recommend that you be screened regularly for cancer of the pelvic organs (ovaries, uterus, and vagina). This screening involves a pelvic examination, including checking for microscopic changes to the surface of your cervix (Pap test). You may be encouraged to have this screening done every 3 years, beginning at age 21.  For women ages 30-65, health care providers may recommend pelvic exams and Pap testing every 3 years, or they may recommend the Pap and pelvic exam, combined with testing for human papilloma virus (HPV), every 5 years. Some types of HPV increase your risk of cervical cancer. Testing for HPV may also be done on women of any age with unclear Pap test results.  Other health care providers may not recommend any screening for nonpregnant women who are considered low risk for pelvic cancer and who do not have symptoms. Ask your health care provider if a screening pelvic exam is right for you.  If you have had past treatment for cervical cancer or a condition that could lead to cancer, you need Pap tests and screening for cancer for at least 20 years after your treatment. If Pap tests have been discontinued, your risk factors (such as having a new sexual partner) need to be reassessed to determine if screening should resume. Some women have medical problems that increase the chance of getting cervical cancer. In these cases, your health care provider may recommend more frequent screening and Pap tests.  Colorectal Cancer  This type of cancer can be detected and often prevented.  Routine colorectal cancer  screening usually begins at 55 years of age and continues through 55 years of age.  Your health care provider may recommend screening at an earlier age if you have risk factors for colon cancer.  Your health care provider may also recommend using home test kits to check for hidden blood in the stool.  A small camera at the end of a tube can be used to examine your colon directly (sigmoidoscopy or colonoscopy). This is done to check for the earliest forms of colorectal cancer.  Routine screening usually begins at age 50.  Direct examination of the colon should be repeated every 5-10 years through 55 years of age. However, you may need to be screened more often if early forms of precancerous polyps or small growths are found.  Skin Cancer  Check your skin from head to toe regularly.  Tell your health care provider about any new moles or changes in moles, especially if there is a change in a mole's shape or color.  Also tell your health care provider if   you have a mole that is larger than the size of a pencil eraser.  Always use sunscreen. Apply sunscreen liberally and repeatedly throughout the day.  Protect yourself by wearing long sleeves, pants, a wide-brimmed hat, and sunglasses whenever you are outside.  Heart disease, diabetes, and high blood pressure  High blood pressure causes heart disease and increases the risk of stroke. High blood pressure is more likely to develop in: ? People who have blood pressure in the high end of the normal range (130-139/85-89 mm Hg). ? People who are overweight or obese. ? People who are African American.  If you are 18-39 years of age, have your blood pressure checked every 3-5 years. If you are 40 years of age or older, have your blood pressure checked every year. You should have your blood pressure measured twice-once when you are at a hospital or clinic, and once when you are not at a hospital or clinic. Record the average of the two measurements.  To check your blood pressure when you are not at a hospital or clinic, you can use: ? An automated blood pressure machine at a pharmacy. ? A home blood pressure monitor.  If you are between 55 years and 79 years old, ask your health care provider if you should take aspirin to prevent strokes.  Have regular diabetes screenings. This involves taking a blood sample to check your fasting blood sugar level. ? If you are at a normal weight and have a low risk for diabetes, have this test once every three years after 55 years of age. ? If you are overweight and have a high risk for diabetes, consider being tested at a younger age or more often. Preventing infection Hepatitis B  If you have a higher risk for hepatitis B, you should be screened for this virus. You are considered at high risk for hepatitis B if: ? You were born in a country where hepatitis B is common. Ask your health care provider which countries are considered high risk. ? Your parents were born in a high-risk country, and you have not been immunized against hepatitis B (hepatitis B vaccine). ? You have HIV or AIDS. ? You use needles to inject street drugs. ? You live with someone who has hepatitis B. ? You have had sex with someone who has hepatitis B. ? You get hemodialysis treatment. ? You take certain medicines for conditions, including cancer, organ transplantation, and autoimmune conditions.  Hepatitis C  Blood testing is recommended for: ? Everyone born from 1945 through 1965. ? Anyone with known risk factors for hepatitis C.  Sexually transmitted infections (STIs)  You should be screened for sexually transmitted infections (STIs) including gonorrhea and chlamydia if: ? You are sexually active and are younger than 55 years of age. ? You are older than 55 years of age and your health care provider tells you that you are at risk for this type of infection. ? Your sexual activity has changed since you were last screened  and you are at an increased risk for chlamydia or gonorrhea. Ask your health care provider if you are at risk.  If you do not have HIV, but are at risk, it may be recommended that you take a prescription medicine daily to prevent HIV infection. This is called pre-exposure prophylaxis (PrEP). You are considered at risk if: ? You are sexually active and do not regularly use condoms or know the HIV status of your partner(s). ? You take drugs by   You are sexually active with a partner who has HIV.  Talk with your health care provider about whether you are at high risk of being infected with HIV. If you choose to begin PrEP, you should first be tested for HIV. You should then be tested every 3 months for as long as you are taking PrEP. Pregnancy  If you are premenopausal and you may become pregnant, ask your health care provider about preconception counseling.  If you may become pregnant, take 400 to 800 micrograms (mcg) of folic acid every day.  If you want to prevent pregnancy, talk to your health care provider about birth control (contraception). Osteoporosis and menopause  Osteoporosis is a disease in which the bones lose minerals and strength with aging. This can result in serious bone fractures. Your risk for osteoporosis can be identified using a bone density scan.  If you are 18 years of age or older, or if you are at risk for osteoporosis and fractures, ask your health care provider if you should be screened.  Ask your health care provider whether you should take a calcium or vitamin D supplement to lower your risk for osteoporosis.  Menopause may have certain physical symptoms and risks.  Hormone replacement therapy may reduce some of these symptoms and risks. Talk to your health care provider about whether hormone replacement therapy is right for you. Follow these instructions at home:  Schedule regular health, dental, and eye exams.  Stay current with your  immunizations.  Do not use any tobacco products including cigarettes, chewing tobacco, or electronic cigarettes.  If you are pregnant, do not drink alcohol.  If you are breastfeeding, limit how much and how often you drink alcohol.  Limit alcohol intake to no more than 1 drink per day for nonpregnant women. One drink equals 12 ounces of beer, 5 ounces of wine, or 1 ounces of hard liquor.  Do not use street drugs.  Do not share needles.  Ask your health care provider for help if you need support or information about quitting drugs.  Tell your health care provider if you often feel depressed.  Tell your health care provider if you have ever been abused or do not feel safe at home. This information is not intended to replace advice given to you by your health care provider. Make sure you discuss any questions you have with your health care provider. Document Released: 06/28/2011 Document Revised: 05/20/2016 Document Reviewed: 09/16/2015 Elsevier Interactive Patient Education  [employer].

## 2018-10-12 ENCOUNTER — Encounter: Payer: Self-pay | Admitting: Internal Medicine

## 2018-10-12 ENCOUNTER — Ambulatory Visit (INDEPENDENT_AMBULATORY_CARE_PROVIDER_SITE_OTHER): Payer: 59 | Admitting: Internal Medicine

## 2018-10-12 ENCOUNTER — Other Ambulatory Visit (INDEPENDENT_AMBULATORY_CARE_PROVIDER_SITE_OTHER): Payer: 59

## 2018-10-12 VITALS — BP 134/82 | HR 54 | Temp 97.6°F | Resp 16 | Ht 59.0 in | Wt 124.0 lb

## 2018-10-12 DIAGNOSIS — K219 Gastro-esophageal reflux disease without esophagitis: Secondary | ICD-10-CM

## 2018-10-12 DIAGNOSIS — R7303 Prediabetes: Secondary | ICD-10-CM

## 2018-10-12 DIAGNOSIS — E785 Hyperlipidemia, unspecified: Secondary | ICD-10-CM

## 2018-10-12 DIAGNOSIS — F5104 Psychophysiologic insomnia: Secondary | ICD-10-CM

## 2018-10-12 DIAGNOSIS — Z0000 Encounter for general adult medical examination without abnormal findings: Secondary | ICD-10-CM | POA: Diagnosis not present

## 2018-10-12 DIAGNOSIS — E038 Other specified hypothyroidism: Secondary | ICD-10-CM

## 2018-10-12 DIAGNOSIS — E538 Deficiency of other specified B group vitamins: Secondary | ICD-10-CM

## 2018-10-12 LAB — CBC WITH DIFFERENTIAL/PLATELET
BASOS ABS: 0 10*3/uL (ref 0.0–0.1)
Basophils Relative: 0.3 % (ref 0.0–3.0)
EOS ABS: 2.4 10*3/uL — AB (ref 0.0–0.7)
Eosinophils Relative: 32.5 % — ABNORMAL HIGH (ref 0.0–5.0)
HEMATOCRIT: 37.3 % (ref 36.0–46.0)
HEMOGLOBIN: 12.3 g/dL (ref 12.0–15.0)
LYMPHS PCT: 22.5 % (ref 12.0–46.0)
Lymphs Abs: 1.7 10*3/uL (ref 0.7–4.0)
MCHC: 32.9 g/dL (ref 30.0–36.0)
MCV: 83.9 fl (ref 78.0–100.0)
Monocytes Absolute: 0.6 10*3/uL (ref 0.1–1.0)
Monocytes Relative: 8.6 % (ref 3.0–12.0)
NEUTROS ABS: 2.7 10*3/uL (ref 1.4–7.7)
Neutrophils Relative %: 36.1 % — ABNORMAL LOW (ref 43.0–77.0)
PLATELETS: 305 10*3/uL (ref 150.0–400.0)
RBC: 4.45 Mil/uL (ref 3.87–5.11)
RDW: 13.5 % (ref 11.5–15.5)
WBC: 7.4 10*3/uL (ref 4.0–10.5)

## 2018-10-12 LAB — LIPID PANEL
CHOLESTEROL: 152 mg/dL (ref 0–200)
HDL: 62.4 mg/dL (ref >39.00)
LDL Cholesterol: 75 mg/dL (ref 0–99)
NonHDL: 89.34
TRIGLYCERIDES: 70 mg/dL (ref 0.0–149.0)
Total CHOL/HDL Ratio: 2
VLDL: 14 mg/dL (ref 0.0–40.0)

## 2018-10-12 LAB — COMPREHENSIVE METABOLIC PANEL
ALBUMIN: 4.3 g/dL (ref 3.5–5.2)
ALT: 19 U/L (ref 0–35)
AST: 22 U/L (ref 0–37)
Alkaline Phosphatase: 61 U/L (ref 39–117)
BILIRUBIN TOTAL: 0.5 mg/dL (ref 0.2–1.2)
BUN: 15 mg/dL (ref 6–23)
CALCIUM: 9.3 mg/dL (ref 8.4–10.5)
CO2: 24 mEq/L (ref 19–32)
CREATININE: 0.64 mg/dL (ref 0.40–1.20)
Chloride: 103 mEq/L (ref 96–112)
GFR: 102.27 mL/min (ref >60.00)
Glucose, Bld: 94 mg/dL (ref 70–99)
Potassium: 4.1 mEq/L (ref 3.5–5.1)
Sodium: 135 mEq/L (ref 135–145)
Total Protein: 8.3 g/dL (ref 6.0–8.3)

## 2018-10-12 LAB — HEMOGLOBIN A1C: HEMOGLOBIN A1C: 5.9 % (ref 4.6–6.5)

## 2018-10-12 LAB — TSH: TSH: 1.24 u[IU]/mL (ref 0.35–4.50)

## 2018-10-12 NOTE — Assessment & Plan Note (Signed)
Taking zantac only as needed continue

## 2018-10-12 NOTE — Assessment & Plan Note (Signed)
Check lipid panel  Continue daily statin Regular exercise and healthy diet encouraged  

## 2018-10-12 NOTE — Assessment & Plan Note (Signed)
Clinically euthyroid Check tsh  Titrate med dose if needed  

## 2018-10-12 NOTE — Assessment & Plan Note (Addendum)
She is not taking ambien nightly - it affected her memory if she takes it nightly Only give her 2-3 hrs when she does take it.   At most gets 4 hrs of sleep a night Has fatigue and headaches depending on sleep Discussed possible referral to sleep clinic/neuro, but she deferred for now

## 2018-10-12 NOTE — Assessment & Plan Note (Signed)
Check a1c Low sugar / carb diet Stressed regular exercise   

## 2018-10-12 NOTE — Assessment & Plan Note (Signed)
Taking oral B12 continue

## 2018-10-23 ENCOUNTER — Other Ambulatory Visit: Payer: Self-pay | Admitting: Internal Medicine

## 2018-10-23 DIAGNOSIS — K219 Gastro-esophageal reflux disease without esophagitis: Secondary | ICD-10-CM

## 2018-11-14 ENCOUNTER — Telehealth: Payer: Self-pay

## 2018-11-14 MED ORDER — FAMOTIDINE 20 MG PO TABS: 20.0000 mg | ORAL_TABLET | Freq: Two times a day (BID) | ORAL | 5 refills | Status: DC

## 2018-11-14 NOTE — Addendum Note (Signed)
Addended by: Binnie Rail on: 11/14/2018 08:24 PM   Modules accepted: Orders

## 2018-11-14 NOTE — Telephone Encounter (Signed)
Copied from [place] [id number]. Topic: General - Other >> Nov 14, 2018 12:38 PM Janace Aris A wrote: Reason for CRM: Pt called in wanting to advise her Provider that the medication ranitidine (ZANTAC) 150 MG tablet  has been recalled. She would need a alternative sent in to the pharmacy.  [facility] ([address]), [place] [facility] [place] [phone] (Phone) [phone] (Fax)

## 2018-11-14 NOTE — Telephone Encounter (Signed)
pepcid sent

## 2018-11-15 NOTE — Telephone Encounter (Signed)
LVM letting pt know.  

## 2018-11-25 NOTE — Progress Notes (Signed)
Subjective:    Patient ID: Maria Barker, female    DOB: 05-Oct-1961, 57 y.o.   MRN: [id number]  HPI The patient is here for an acute visit.   Left shoulder pain:  It started three weeks ago.  There is no injury-the pain just started.  The pain is in the left upper back down to the elbow.  She had a few days off from work recently and it is a little better.  She tried tylenol and it helped temporarily.  She denies numbness/tingling or weakness in the left arm or hand.  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  Using her left arm makes it worse, but with just moving her left shoulder it is not making it worse.  She denies neck pain or neck stiffness.    Medications and allergies reviewed with patient and updated if appropriate.  Patient Active Problem List   Diagnosis Date Noted  . Fatigue 03/16/2018  . Rash and nonspecific skin eruption 03/16/2018  . De Quervain's tenosynovitis, right 03/09/2018  . Chronic nonintractable headache 03/09/2018  . Pain of upper abdomen 01/17/2018  . Prediabetes 09/17/2017  . Chronic pain of left knee 04/12/2017  . B12 deficiency 08/13/2016  . S/P bilateral hip replacements 08/13/2016  . Family history of brain aneurysm 08/12/2015  . Gastroesophageal reflux disease without esophagitis 08/12/2015  . Hypothyroid 02/06/2015  . Hyperlipidemia with target LDL less than 130   . Chronic insomnia 04/13/2013    Current Outpatient Medications on File Prior to Visit  Medication Sig Dispense Refill  . atorvastatin (LIPITOR) 10 MG tablet TAKE 1 TABLET BY MOUTH ONCE DAILY 90 tablet 0  . Calcium Carbonate-Vitamin D (CALCIUM-VITAMIN D3 PO) Take 1 each by mouth 2 (two) times daily.    . famotidine (PEPCID) 20 MG tablet Take 1 tablet (20 mg total) by mouth 2 (two) times daily. 60 tablet 5  . levothyroxine (SYNTHROID, LEVOTHROID) 50 MCG tablet TAKE 1 TABLET BY MOUTH ONCE DAILY BEFORE BREAKFAST 90 tablet 1  . vitamin B-12  (CYANOCOBALAMIN) 1000 MCG tablet Take 1,000 mcg by mouth daily.    Marland Kitchen zolpidem (AMBIEN) 5 MG tablet Take 1 tablet (5 mg total) by mouth at bedtime as needed for sleep. 30 tablet 1   No current facility-administered medications on file prior to visit.     Past Medical History:  Diagnosis Date  . Arthritis    R THR 2011  . Back pain   . Frequent headaches   . Hyperlipidemia LDL goal < 130   . Hypothyroid 02/06/2015   Dx 01/2015: TSH 5.4  . Insomnia   . Left hip pain   . Pain of left thigh     Past Surgical History:  Procedure Laterality Date  . 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 TOTAL HIP ARTHROPLASTY Left 03/08/2016   Procedure: TOTAL HIP ARTHROPLASTY;  Surgeon: Frederik Pear, MD;  Location: [facility];  Service: Orthopedics;  Laterality: Left;  . TUBAL LIGATION    . UPPER GASTROINTESTINAL ENDOSCOPY      Social History   Socioeconomic History  . Marital status: Married    Spouse name: Not on file  . Number of children: 5  . Years of education: Not on file  . Highest education level: Not on file  Occupational History  . Occupation: Glass blower/designer    Employer: [employer]  Social Needs  . Financial resource strain: Not on file  .  Food insecurity:    Worry: Not on file    Inability: Not on file  . Transportation needs:    Medical: Not on file    Non-medical: Not on file  Tobacco Use  . Smoking status: Never Smoker  . Smokeless tobacco: Never Used  Substance and Sexual Activity  . Alcohol use: No  . Drug use: No  . Sexual activity: Not on file  Lifestyle  . Physical activity:    Days per week: Not on file    Minutes per session: Not on file  . Stress: Not on file  Relationships  . Social connections:    Talks on phone: Not on file    Gets together: Not on file    Attends religious service: Not on file    Active member of club or organization: Not on file    Attends meetings of clubs or organizations: Not on file    Relationship status: Not on file    Other Topics Concern  . Not on file  Social History Narrative  . Not on file    Family History  Problem Relation Age of Onset  . CVA Mother   . Hypertension Mother     Review of Systems  Constitutional: Negative for chills and fever.  Musculoskeletal: Positive for gait problem (upper back) and neck pain.  Neurological: Positive for headaches. Negative for weakness and numbness.       Objective:   Vitals:   11/27/18 0958  BP: (!) 146/80  Pulse: 80  Resp: 14  Temp: 98.1 F (36.7 C)  SpO2: 96%   BP Readings from Last 3 Encounters:  11/27/18 (!) 146/80  10/12/18 134/82  07/03/18 128/76   Wt Readings from Last 3 Encounters:  11/27/18 126 lb (57.2 kg)  10/12/18 124 lb (56.2 kg)  07/03/18 124 lb (56.2 kg)   Body mass index is 25.45 kg/m.   Physical Exam  Constitutional: She appears well-developed and well-nourished. No distress.  HENT:  Head: Normocephalic and atraumatic.  Musculoskeletal:  No cervical spine tenderness or deformity, no tenderness with palpation of paracervical muscles.  Tenderness with palpation muscular region just medial of left scapula and left upper back.  No left shoulder tenderness or pain in the shoulder with movement  Neurological:  Normal strength and sensation bilateral upper extremities  Skin: Skin is warm and dry. No rash noted. She is not diaphoretic. No erythema.             Assessment & Plan:    See Problem List for Assessment and Plan of chronic medical problems.

## 2018-11-27 ENCOUNTER — Ambulatory Visit: Payer: Commercial Managed Care - PPO | Admitting: Internal Medicine

## 2018-11-27 ENCOUNTER — Encounter: Payer: Self-pay | Admitting: Internal Medicine

## 2018-11-27 VITALS — BP 146/80 | HR 80 | Temp 98.1°F | Resp 14 | Ht 59.0 in | Wt 126.0 lb

## 2018-11-27 DIAGNOSIS — M549 Dorsalgia, unspecified: Secondary | ICD-10-CM | POA: Diagnosis not present

## 2018-11-27 MED ORDER — CYCLOBENZAPRINE HCL 5 MG PO TABS: 5.0000 mg | ORAL_TABLET | Freq: Every day | ORAL | 1 refills | Status: DC

## 2018-11-27 NOTE — Patient Instructions (Signed)
Take the muscle relaxer at night, flexeril 5 mg.  This may make you drowsy.  Apply heat and stretch the area.    If there is no improvement we can consider physical therapy or a referral to a sports medicine doctor.       Muscle Strain A muscle strain is an injury that occurs when a muscle is stretched beyond its normal length. Usually a small number of muscle fibers are torn when this happens. Muscle strain is rated in degrees. First-degree strains have the least amount of muscle fiber tearing and pain. Second-degree and third-degree strains have increasingly more tearing and pain. Usually, recovery from muscle strain takes 1-2 weeks. Complete healing takes 5-6 weeks. What are the causes? Muscle strain happens when a sudden, violent force placed on a muscle stretches it too far. This may occur with lifting, sports, or a fall. What increases the risk? Muscle strain is especially common in athletes. What are the signs or symptoms? At the site of the muscle strain, there may be:  Pain.  Bruising.  Swelling.  Difficulty using the muscle due to pain or lack of normal function.  How is this diagnosed? Your health care provider will perform a physical exam and ask about your medical history. How is this treated? Often, the best treatment for a muscle strain is resting, icing, and applying cold compresses to the injured area. Follow these instructions at home:  Use the PRICE method of treatment to promote muscle healing during the first 2-3 days after your injury. The PRICE method involves: ? Protecting the muscle from being injured again. ? Restricting your activity and resting the injured body part. ? Icing your injury. To do this, put ice in a plastic bag. Place a towel between your skin and the bag. Then, apply the ice and leave it on from 15-20 minutes each hour. After the third day, switch to moist heat packs. ? Apply compression to the injured area with a splint or elastic  bandage. Be careful not to wrap it too tightly. This may interfere with blood circulation or increase swelling. ? Elevate the injured body part above the level of your heart as often as you can.  Only take over-the-counter or prescription medicines for pain, discomfort, or fever as directed by your health care provider.  Warming up prior to exercise helps to prevent future muscle strains. Contact a health care provider if:  You have increasing pain or swelling in the injured area.  You have numbness, tingling, or a significant loss of strength in the injured area. This information is not intended to replace advice given to you by your health care provider. Make sure you discuss any questions you have with your health care provider. Document Released: 12/13/2005 Document Revised: 05/20/2016 Document Reviewed: 07/12/2013 Elsevier Interactive Patient Education  2017 [employer].

## 2018-11-27 NOTE — Assessment & Plan Note (Signed)
Muscular pain in nature Apply heat, gentle stretching Start Flexeril 5 mg at bedtime-can increase if needed Discussed physical therapy and referral to sports medicine-if there is no improvement she will let me know and we can consider these options

## 2018-12-08 ENCOUNTER — Telehealth: Payer: Self-pay

## 2018-12-08 NOTE — Telephone Encounter (Signed)
Copied from [place] [id number]. Topic: General - Other >> Dec 08, 2018 10:03 AM Virl Axe D wrote: Reason for CRM: Pt called and stated she needs Dr. Quay Burow to resend a letter with proof that she had her physical on 10/12/18 to her HR department. Please advise Fax: [phone]

## 2018-12-08 NOTE — Telephone Encounter (Signed)
LVM letting pt know that the fax number given is not working. Need another one.

## 2018-12-08 NOTE — Telephone Encounter (Signed)
Letter printed and refaxed.

## 2018-12-11 NOTE — Telephone Encounter (Signed)
Patient does not have another fax number but she has a email.    [email or web address]

## 2018-12-11 NOTE — Telephone Encounter (Signed)
LVM letting pt know that we do not send letters through personal e-mails but she can access the letter on mychart which it looks like she has. Told her to call back if she wants it sent in the mail.

## 2018-12-11 NOTE — Telephone Encounter (Signed)
Letter has been printed and mailed.

## 2018-12-11 NOTE — Telephone Encounter (Signed)
Pt called back in and stated she could not pull it up on mychart so she would like to know if it can be just put it in the mail.  I verified that the mailing Address is correct on file

## 2019-01-07 NOTE — Progress Notes (Signed)
Subjective:    Patient ID: Maria Barker, female    DOB: 1961/02/24, 58 y.o.   MRN: [id number]  HPI The patient is here for follow up.  Two nights ago she started having a dry cough, fever, and lightheadedness. She has not had a fever since.  Her lightheadedness was the past two nights.  She has a mild sore throat and PND.  She did take theraflu and it helped a little.      Hypothyroidism:  She is taking her medication daily.  She denies any recent changes in energy or weight that are unexplained.   Hyperlipidemia: She is taking her medication daily. She is compliant with a low fat/cholesterol diet. She is not exercising regularly. She denies myalgias.   Prediabetes:  She is compliant with a low sugar/carbohydrate diet.  She is not exercising regularly.  Insomnia;  She takes [place] some nights, not nightly.  Her sleep varies - sometimes she gets good nights - usually 3-4 nights.      Lower lip itches and rash.  Every time she eats chicken is causes this.  No other food cause it.  She denies putting any spice on the chicken.  No other meat, poulty or beef cause this symptoms.  She uses clobetasol cream on it and it helps.  It is getting worse.    Medications and allergies reviewed with patient and updated if appropriate.  Patient Active Problem List   Diagnosis Date Noted  . Upper back pain on left side 11/27/2018  . Fatigue 03/16/2018  . Rash and nonspecific skin eruption 03/16/2018  . De Quervain's tenosynovitis, right 03/09/2018  . Chronic nonintractable headache 03/09/2018  . Pain of upper abdomen 01/17/2018  . Prediabetes 09/17/2017  . Chronic pain of left knee 04/12/2017  . B12 deficiency 08/13/2016  . S/P bilateral hip replacements 08/13/2016  . Family history of brain aneurysm 08/12/2015  . Gastroesophageal reflux disease without esophagitis 08/12/2015  . Hypothyroid 02/06/2015  . Hyperlipidemia   . Chronic insomnia 04/13/2013    Current Outpatient Medications on  File Prior to Visit  Medication Sig Dispense Refill  . atorvastatin (LIPITOR) 10 MG tablet TAKE 1 TABLET BY MOUTH ONCE DAILY 90 tablet 0  . Calcium Carbonate-Vitamin D (CALCIUM-VITAMIN D3 PO) Take 1 each by mouth 2 (two) times daily.    . cyclobenzaprine (FLEXERIL) 5 MG tablet Take 1 tablet (5 mg total) by mouth at bedtime. 30 tablet 1  . famotidine (PEPCID) 20 MG tablet Take 1 tablet (20 mg total) by mouth 2 (two) times daily. 60 tablet 5  . levothyroxine (SYNTHROID, LEVOTHROID) 50 MCG tablet TAKE 1 TABLET BY MOUTH ONCE DAILY BEFORE BREAKFAST 90 tablet 1  . Omega-3 1000 MG CAPS Take 2,000 mg by mouth.    . vitamin B-12 (CYANOCOBALAMIN) 1000 MCG tablet Take 1,000 mcg by mouth daily.    Marland Kitchen zolpidem (AMBIEN) 5 MG tablet Take 1 tablet (5 mg total) by mouth at bedtime as needed for sleep. 30 tablet 1   No current facility-administered medications on file prior to visit.     Past Medical History:  Diagnosis Date  . Arthritis    R THR 2011  . Back pain   . Frequent headaches   . Hyperlipidemia LDL goal < 130   . Hypothyroid 02/06/2015   Dx 01/2015: TSH 5.4  . Insomnia   . Left hip pain   . Pain of left thigh     Past Surgical History:  Procedure Laterality Date  .  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 TOTAL HIP ARTHROPLASTY Left 03/08/2016   Procedure: TOTAL HIP ARTHROPLASTY;  Surgeon: Frederik Pear, MD;  Location: [facility];  Service: Orthopedics;  Laterality: Left;  . TUBAL LIGATION    . UPPER GASTROINTESTINAL ENDOSCOPY      Social History   Socioeconomic History  . Marital status: Married    Spouse name: Not on file  . Number of children: 5  . Years of education: Not on file  . Highest education level: Not on file  Occupational History  . Occupation: Glass blower/designer    Employer: [employer]  Social Needs  . Financial resource strain: Not on file  . Food insecurity:    Worry: Not on file    Inability: Not on file  . Transportation needs:    Medical: Not on file      Non-medical: Not on file  Tobacco Use  . Smoking status: Never Smoker  . Smokeless tobacco: Never Used  Substance and Sexual Activity  . Alcohol use: No  . Drug use: No  . Sexual activity: Not on file  Lifestyle  . Physical activity:    Days per week: Not on file    Minutes per session: Not on file  . Stress: Not on file  Relationships  . Social connections:    Talks on phone: Not on file    Gets together: Not on file    Attends religious service: Not on file    Active member of club or organization: Not on file    Attends meetings of clubs or organizations: Not on file    Relationship status: Not on file  Other Topics Concern  . Not on file  Social History Narrative  . Not on file    Family History  Problem Relation Age of Onset  . CVA Mother   . Hypertension Mother     Review of Systems  Constitutional: Positive for fever (two nights ago only). Negative for chills.  HENT: Positive for postnasal drip and sore throat (mild). Negative for congestion, ear pain and sinus pain.   Respiratory: Positive for cough (dry). Negative for shortness of breath and wheezing.   Cardiovascular: Negative for chest pain, palpitations and leg swelling.  Neurological: Positive for light-headedness (for the past two nights) and headaches (mild, frontal).       Objective:   Vitals:   01/08/19 0943  BP: (!) 158/84  Pulse: 64  Resp: 16  Temp: 98.4 F (36.9 C)  SpO2: 95%   BP Readings from Last 3 Encounters:  01/08/19 (!) 158/84  11/27/18 (!) 146/80  10/12/18 134/82   Wt Readings from Last 3 Encounters:  01/08/19 127 lb (57.6 kg)  11/27/18 126 lb (57.2 kg)  10/12/18 124 lb (56.2 kg)   Body mass index is 25.65 kg/m.   Physical Exam    Constitutional: Appears well-developed and well-nourished. No distress.  HENT:  Head: Normocephalic and atraumatic.  Neck: Neck supple. No tracheal deviation present. No thyromegaly present.  No cervical lymphadenopathy Cardiovascular:  Normal rate, regular rhythm and normal heart sounds.   No murmur heard. No carotid bruit .  No edema Pulmonary/Chest: Effort normal and breath sounds normal. No respiratory distress. No has no wheezes. No rales.  Skin: Skin is warm and dry. Not diaphoretic. Lips normal appearing w/o rash or swelling Psychiatric: Normal mood and affect. Behavior is normal.      Assessment & Plan:    See Problem List for  Assessment and Plan of chronic medical problems.

## 2019-01-08 ENCOUNTER — Encounter: Payer: Self-pay | Admitting: Internal Medicine

## 2019-01-08 ENCOUNTER — Other Ambulatory Visit (INDEPENDENT_AMBULATORY_CARE_PROVIDER_SITE_OTHER): Payer: Commercial Managed Care - PPO

## 2019-01-08 ENCOUNTER — Ambulatory Visit: Payer: Commercial Managed Care - PPO | Admitting: Internal Medicine

## 2019-01-08 VITALS — BP 158/84 | HR 64 | Temp 98.4°F | Resp 16 | Ht 59.0 in | Wt 127.0 lb

## 2019-01-08 DIAGNOSIS — F5104 Psychophysiologic insomnia: Secondary | ICD-10-CM

## 2019-01-08 DIAGNOSIS — R7303 Prediabetes: Secondary | ICD-10-CM

## 2019-01-08 DIAGNOSIS — E782 Mixed hyperlipidemia: Secondary | ICD-10-CM | POA: Diagnosis not present

## 2019-01-08 DIAGNOSIS — E038 Other specified hypothyroidism: Secondary | ICD-10-CM

## 2019-01-08 DIAGNOSIS — R220 Localized swelling, mass and lump, head: Secondary | ICD-10-CM | POA: Insufficient documentation

## 2019-01-08 DIAGNOSIS — I10 Essential (primary) hypertension: Secondary | ICD-10-CM | POA: Diagnosis not present

## 2019-01-08 DIAGNOSIS — K130 Diseases of lips: Secondary | ICD-10-CM

## 2019-01-08 LAB — COMPREHENSIVE METABOLIC PANEL
ALT: 24 U/L (ref 0–35)
AST: 28 U/L (ref 0–37)
Albumin: 3.8 g/dL (ref 3.5–5.2)
Alkaline Phosphatase: 64 U/L (ref 39–117)
BUN: 13 mg/dL (ref 6–23)
CO2: 24 mEq/L (ref 19–32)
Calcium: 8.9 mg/dL (ref 8.4–10.5)
Chloride: 102 mEq/L (ref 96–112)
Creatinine, Ser: 0.63 mg/dL (ref 0.40–1.20)
GFR: 104.05 mL/min (ref >60.00)
Glucose, Bld: 89 mg/dL (ref 70–99)
POTASSIUM: 3.6 meq/L (ref 3.5–5.1)
SODIUM: 134 meq/L — AB (ref 135–145)
Total Bilirubin: 0.3 mg/dL (ref 0.2–1.2)
Total Protein: 7.9 g/dL (ref 6.0–8.3)

## 2019-01-08 LAB — HEMOGLOBIN A1C: Hgb A1c MFr Bld: 6.3 % (ref 4.6–6.5)

## 2019-01-08 LAB — CBC WITH DIFFERENTIAL/PLATELET
Basophils Absolute: 0.1 10*3/uL (ref 0.0–0.1)
Basophils Relative: 2.4 % (ref 0.0–3.0)
EOS PCT: 19.3 % — AB (ref 0.0–5.0)
Eosinophils Absolute: 1.1 10*3/uL — ABNORMAL HIGH (ref 0.0–0.7)
HCT: 37.7 % (ref 36.0–46.0)
Hemoglobin: 12.5 g/dL (ref 12.0–15.0)
Lymphocytes Relative: 28 % (ref 12.0–46.0)
Lymphs Abs: 1.6 10*3/uL (ref 0.7–4.0)
MCHC: 33.2 g/dL (ref 30.0–36.0)
MCV: 83.7 fl (ref 78.0–100.0)
MONO ABS: 0.8 10*3/uL (ref 0.1–1.0)
Monocytes Relative: 13.4 % — ABNORMAL HIGH (ref 3.0–12.0)
Neutro Abs: 2.1 10*3/uL (ref 1.4–7.7)
Neutrophils Relative %: 36.9 % — ABNORMAL LOW (ref 43.0–77.0)
Platelets: 335 10*3/uL (ref 150.0–400.0)
RBC: 4.51 Mil/uL (ref 3.87–5.11)
RDW: 13.1 % (ref 11.5–15.5)
WBC: 5.7 10*3/uL (ref 4.0–10.5)

## 2019-01-08 LAB — LIPID PANEL
Cholesterol: 145 mg/dL (ref 0–200)
HDL: 55.9 mg/dL (ref >39.00)
LDL Cholesterol: 73 mg/dL (ref 0–99)
NonHDL: 88.98
Total CHOL/HDL Ratio: 3
Triglycerides: 81 mg/dL (ref 0.0–149.0)
VLDL: 16.2 mg/dL (ref 0.0–40.0)

## 2019-01-08 LAB — TSH: TSH: 0.22 u[IU]/mL — ABNORMAL LOW (ref 0.35–4.50)

## 2019-01-08 MED ORDER — AMLODIPINE BESYLATE 2.5 MG PO TABS: 2.5000 mg | ORAL_TABLET | Freq: Every day | ORAL | 5 refills | Status: DC

## 2019-01-08 NOTE — Assessment & Plan Note (Signed)
Lower lip intermittent tingling/itchy and rash when eating chicken only Uses clobetasol cream and it helps.  ? Allergy Will refer to allergy

## 2019-01-08 NOTE — Assessment & Plan Note (Signed)
Check lipid panel  Continue daily statin Regular exercise and healthy diet encouraged  

## 2019-01-08 NOTE — Assessment & Plan Note (Signed)
Elevated BP - new htn, BP has been borderline high or high the last 3 visits Low sodium diet Start exercising again Monitor BP  - discussed goal Start amlodipine 2.5 mg daily cmp

## 2019-01-08 NOTE — Assessment & Plan Note (Signed)
Clinically euthyroid Check tsh  Titrate med dose if needed  

## 2019-01-08 NOTE — Assessment & Plan Note (Signed)
Fairly controlled Gets 3-4 good nights of sleep a week ambien helping -other medication have not helped Continue ambien at current dose

## 2019-01-08 NOTE — Assessment & Plan Note (Signed)
Check a1c Low sugar / carb diet Stressed regular exercise   

## 2019-01-08 NOTE — Patient Instructions (Addendum)
  Tests ordered today. Your results will be released to [place] (or called to you) after review, usually within 72hours after test completion. If any changes need to be made, you will be notified at that same time.   Medications reviewed and updated.  Changes include :   Start amlodipine 2.5 mg daily.    Your prescription(s) have been submitted to your pharmacy. Please take as directed and contact our office if you believe you are having problem(s) with the medication(s).  A referral was ordered for an allergist.   Please followup in 6 months   Your blood pressure should be consistently less than 140/90.  Monitor it at a pharmacy or at home and keep a log.

## 2019-01-11 DIAGNOSIS — R070 Pain in throat: Secondary | ICD-10-CM | POA: Diagnosis not present

## 2019-01-11 DIAGNOSIS — R0981 Nasal congestion: Secondary | ICD-10-CM | POA: Diagnosis not present

## 2019-01-11 DIAGNOSIS — J00 Acute nasopharyngitis [common cold]: Secondary | ICD-10-CM | POA: Diagnosis not present

## 2019-01-23 ENCOUNTER — Other Ambulatory Visit: Payer: Self-pay | Admitting: Internal Medicine

## 2019-01-24 ENCOUNTER — Other Ambulatory Visit: Payer: Self-pay | Admitting: Internal Medicine

## 2019-01-24 NOTE — Telephone Encounter (Signed)
Last refill was 04/06/18 Last routine OV 10/12/18 Next OV 07/11/19

## 2019-01-30 ENCOUNTER — Ambulatory Visit: Payer: Self-pay

## 2019-01-30 NOTE — Telephone Encounter (Signed)
Outgoing call to Patient who inquires, since she started  Norvasc 2.5mg . She has had one nose bleed   for about one  Minute.  Triager researched medication  Medex, and  Up to date, application States that Epistaxis is a side effect.  Telephone call back to Patient.  Related to Patient that  Epistaxis is a side effect of Norvasc.  Encourage Patient to monitor the episodes, and  Call us back. Patient voiced understanding.                                                                          Reason for Disposition . Caller has medication question only, adult not sick, and triager answers question  Answer Assessment - Initial Assessment Questions 1. SYMPTOMS: "Do you have any symptoms?"     Nose Bleed , X1   About. 1 min.    2. SEVERITY: If symptoms are present, ask "Are they mild, moderate or severe?"     *No Answer*  Protocols used: MEDICATION QUESTION CALL-A-AH

## 2019-02-01 ENCOUNTER — Other Ambulatory Visit: Payer: Self-pay | Admitting: Internal Medicine

## 2019-02-19 ENCOUNTER — Ambulatory Visit: Payer: 59 | Admitting: Allergy

## 2019-02-23 ENCOUNTER — Other Ambulatory Visit (INDEPENDENT_AMBULATORY_CARE_PROVIDER_SITE_OTHER): Payer: Commercial Managed Care - PPO

## 2019-02-23 DIAGNOSIS — E038 Other specified hypothyroidism: Secondary | ICD-10-CM

## 2019-02-23 LAB — TSH: TSH: 1.66 u[IU]/mL (ref 0.35–4.50)

## 2019-02-24 ENCOUNTER — Encounter: Payer: Self-pay | Admitting: Internal Medicine

## 2019-03-12 ENCOUNTER — Telehealth: Payer: Self-pay

## 2019-03-12 MED ORDER — OMEPRAZOLE 20 MG PO CPDR: 20.0000 mg | DELAYED_RELEASE_CAPSULE | Freq: Every day | ORAL | 5 refills | Status: DC

## 2019-03-12 NOTE — Telephone Encounter (Signed)
Received fax from [facility] stating that famotidine is on back order in all strengths. Requesting alternatives.

## 2019-03-12 NOTE — Telephone Encounter (Signed)
Omeprazole sent to [facility]

## 2019-03-12 NOTE — Telephone Encounter (Signed)
Sent my chart message letting patient know.

## 2019-03-27 ENCOUNTER — Telehealth: Payer: Commercial Managed Care - PPO | Admitting: Physician Assistant

## 2019-03-27 DIAGNOSIS — R1013 Epigastric pain: Secondary | ICD-10-CM

## 2019-03-27 NOTE — Progress Notes (Signed)
Subjective:    Patient ID: Maria Barker, female    DOB: [birth year], 58 y.o.   MRN: [id number]  HPI The patient is here for an acute visit.   Chest pain:  A couple of months ago she had left sided chest pain that was sharp - she woke up with the pain.  She laid there for a while and pain went away.  Soon after that she had right sided chest pain that radiated to her shoulder and upper arm.  She had lifted up something at work.  That pain lasted all night in the right upper chest and shoulder/upper back.  She took tylenol and it helped.  The next morning the chest pain was gone - she just had the shoulder pain.  She has continued to have right upper backslash shoulder pain.  The pain is constant - it is mild.  The pain is worse with movement of her arm.  She took a flexeril last night and it did help.    Two days ago she felt heartburn that started in the epigastric region and radiated up to the left chest.  It lasted twenty or thirty min.  She does have occasional epigastric discomfort or pain and wondered about an ulcer.  She has not had heartburn since then.  She denies heartburn on a regular basis.  She is unsure if she ate something that may have caused it.  She was concerned about possibility of having had a heart attack.   GERD:  She is taking her medication daily as prescribed.  She does not feel that the omeprazole works as well as ranitidine twice daily done.  She denies any GERD symptoms normally - she just had heartburn on Monday.  She is unsure why she had it on Monday.  She feels her GERD is overall controlled.   Hypertension: She is not taking her medication daily. She stopped the amlodipine after taking it for two weeks because she had nosebleeds.  She is compliant with a low sodium diet.  She denies palpitations, edema, shortness of breath and regular headaches. She does monitor her blood pressure at home about 3 times a week-it is variable, but has been elevated on occasion.      Medications and allergies reviewed with patient and updated if appropriate.  Patient Active Problem List   Diagnosis Date Noted  . Rash on lips 01/08/2019  . Essential hypertension 01/08/2019  . Upper back pain on left side 11/27/2018  . Fatigue 03/16/2018  . Rash and nonspecific skin eruption 03/16/2018  . De Quervain's tenosynovitis, right 03/09/2018  . Chronic nonintractable headache 03/09/2018  . Pain of upper abdomen 01/17/2018  . Prediabetes 09/17/2017  . Chronic pain of left knee 04/12/2017  . B12 deficiency 08/13/2016  . S/P bilateral hip replacements 08/13/2016  . Family history of brain aneurysm 08/12/2015  . Gastroesophageal reflux disease without esophagitis 08/12/2015  . Hypothyroid 02/06/2015  . Hyperlipidemia   . Chronic insomnia 04/13/2013    Current Outpatient Medications on File Prior to Visit  Medication Sig Dispense Refill  . atorvastatin (LIPITOR) 10 MG tablet TAKE 1 TABLET BY MOUTH ONCE DAILY 90 tablet 0  . Calcium Carbonate-Vitamin D (CALCIUM-VITAMIN D3 PO) Take 1 each by mouth 2 (two) times daily.    . cyclobenzaprine (FLEXERIL) 5 MG tablet Take 1 tablet (5 mg total) by mouth at bedtime. 30 tablet 1  . levothyroxine (SYNTHROID, LEVOTHROID) 50 MCG tablet Take 1 tablet by mouth 6 days a  week. 90 tablet 0  . Omega-3 1000 MG CAPS Take 2,000 mg by mouth.    Marland Kitchen omeprazole (PRILOSEC) 20 MG capsule Take 1 capsule (20 mg total) by mouth daily. Take 30 minutes prior to a meal 30 capsule 5  . vitamin B-12 (CYANOCOBALAMIN) 1000 MCG tablet Take 1,000 mcg by mouth daily.    Marland Kitchen zolpidem (AMBIEN) 5 MG tablet TAKE 1 TABLET BY MOUTH AT BEDTIME AS NEEDED FOR SLEEP 30 tablet 0   No current facility-administered medications on file prior to visit.     Past Medical History:  Diagnosis Date  . Arthritis    R THR 2011  . Back pain   . Frequent headaches   . Hyperlipidemia LDL goal < 130   . Hypothyroid 02/06/2015   Dx 01/2015: TSH 5.4  . Insomnia   . Left hip pain   .  Pain of left thigh     Past Surgical History:  Procedure Laterality Date  . 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 TOTAL HIP ARTHROPLASTY Left 03/08/2016   Procedure: TOTAL HIP ARTHROPLASTY;  Surgeon: Frederik Pear, MD;  Location: [facility];  Service: Orthopedics;  Laterality: Left;  . TUBAL LIGATION    . UPPER GASTROINTESTINAL ENDOSCOPY      Social History   Socioeconomic History  . Marital status: Married    Spouse name: Not on file  . Number of children: 5  . Years of education: Not on file  . Highest education level: Not on file  Occupational History  . Occupation: Glass blower/designer    Employer: [employer]  Social Needs  . Financial resource strain: Not on file  . Food insecurity:    Worry: Not on file    Inability: Not on file  . Transportation needs:    Medical: Not on file    Non-medical: Not on file  Tobacco Use  . Smoking status: Never Smoker  . Smokeless tobacco: Never Used  Substance and Sexual Activity  . Alcohol use: No  . Drug use: No  . Sexual activity: Not on file  Lifestyle  . Physical activity:    Days per week: Not on file    Minutes per session: Not on file  . Stress: Not on file  Relationships  . Social connections:    Talks on phone: Not on file    Gets together: Not on file    Attends religious service: Not on file    Active member of club or organization: Not on file    Attends meetings of clubs or organizations: Not on file    Relationship status: Not on file  Other Topics Concern  . Not on file  Social History Narrative  . Not on file    Family History  Problem Relation Age of Onset  . CVA Mother   . Hypertension Mother     Review of Systems  Constitutional: Negative for chills and fever.  HENT: Positive for congestion (mild) and postnasal drip. Negative for ear pain (left ear itches).   Respiratory: Negative for cough, shortness of breath and wheezing.   Cardiovascular: Positive for chest pain. Negative for  palpitations and leg swelling.  Gastrointestinal: Negative for nausea.  Neurological: Positive for dizziness (occasional) and headaches (occ, sometimes). Negative for light-headedness.       Objective:   Vitals:   03/28/19 1043  BP: (!) 150/82  Pulse: 63  Resp: 16  Temp: 97.8 F (36.6 C)  SpO2: 96%   BP  Readings from Last 3 Encounters:  03/28/19 (!) 150/82  01/08/19 (!) 158/84  11/27/18 (!) 146/80   Wt Readings from Last 3 Encounters:  03/28/19 126 lb 12.8 oz (57.5 kg)  01/08/19 127 lb (57.6 kg)  11/27/18 126 lb (57.2 kg)   Body mass index is 25.61 kg/m.   Physical Exam    Constitutional: Appears well-developed and well-nourished. No distress.  HENT:  Head: Normocephalic and atraumatic.  Neck: Neck supple. No tracheal deviation present. No thyromegaly present.  No cervical lymphadenopathy Cardiovascular: Normal rate, regular rhythm and normal heart sounds.   No murmur heard. No carotid bruit .  No edema Pulmonary/Chest: No chest wall tenderness with palpation.  Effort normal and breath sounds normal. No respiratory distress. No has no wheezes. No rales.  Abdomen: Soft, minimal tenderness epigastric region without rebound or guarding.  And no other tenderness.  No distention. Musculoskeletal: No right upper back or right shoulder tenderness with palpation.  Pain increases in the right upper back with movement of the arm Skin: Skin is warm and dry. Not diaphoretic.  Psychiatric: Normal mood and affect. Behavior is normal.        Assessment & Plan:    See Problem List for Assessment and Plan of chronic medical problems.

## 2019-03-27 NOTE — Progress Notes (Signed)
Based on what you shared with me, I feel your condition warrants further evaluation and I recommend that you be seen for a face to face office visit. Mostly, you have already tried common medications that relieve heart burn and that did not work well for you.  I recommend you following up with your healthcare provider so you can discuss your symptoms in more detail and further testing can be considered.      NOTE: If you entered your credit card information for this eVisit, you will not be charged. You may see a "hold" on your card for the $35 but that hold will drop off and you will not have a charge processed.  If you are having a true medical emergency please call 911.  If you need an urgent face to face visit,  has four urgent care centers for your convenience.    PLEASE NOTE: THE [facility] LOCATIONS AND URGENT CARE CLINICS DO NOT HAVE THE TESTING FOR CORONAVIRUS COVID19 AVAILABLE.  IF YOU FEEL YOU NEED THIS TEST YOU MUST GO TO A TRIAGE LOCATION AT [facility] ?  [email or web address] to reserve your spot online an avoid wait times  [facility] [address], [address] [place], [place] [address] 8 am to 8 pm Monday-Friday 10 am to 4 pm Saturday-Sunday *Across the street from [employer]  [place], [address] 8 am to 5 pm Monday-Friday * In the [facility] on the [facility]   The following sites will take your insurance:  [facility] a Provider at this Location  [place], [place] [address] 10 am to 8 pm Monday-Friday 12 pm to 8 pm [facility] [place] a Provider at this Location  [place], [place], [place] [address] 8 am to 8 pm Monday-Friday 9 am to 6 pm Saturday 11 am to 6 pm  Sunday   [facility] at [facility]  [phone] Get Driving Directions  [address] [place], [place] [address] 8 am to 8 pm Monday-Friday 8 am to 4 pm Saturday-Sunday   Your e-visit answers were reviewed by a board certified advanced clinical practitioner to complete your personal care plan.  Thank you for using e-Visits.  ===View-only below this line===   ----- Message -----    From: [name]    Sent: 03/27/2019 10:46 AM EDT      To: E-Visit Mailing List Subject: E-Visit Submission: Heartburn  E-Visit Submission: Heartburn --------------------------------  Question: How long have you had heartburn? Answer:   More than two days and less than one week  Question: How often do you experience heartburn? Answer:   Every couple of days  Question: How long does your heartburn last? Answer:   It lasts less than 20 minutes  Question: Where do you feel the heartburn? Answer:   In the middle of my chest            In my back            On my right side  Question: Does your heartburn wake you from sleep? Answer:   No  Question: Does your heartburn limit your ability to do things you need to do? Answer:   No  Question: Does your heartburn change depending on whether you are sitting,  standing, or lying down? Answer:   Yes  Question: Which of the following are you experiencing? Answer:   None of the above  Question: Do you have any of the following? Answer:   None of the above  Question: Do you have trouble swallowing? Answer:   No  Question: Do you feel full after eating less than usual? Answer:   No  Question: Do you have any of the following? Answer:   Chest pain  Question: Which of  the following makes the heartburn worse? Answer:   Eating certain foods            Stress  Question: Do you have any of the following? Answer:   None of the above  Question: Have you lost weight lately without trying? Answer:   No  Question:  Have you ever been told you have the following? Answer:   None of the above  Question: Have you seen a healthcare provider for your heartburn? Answer:   I saw someone in the past month  Question: Have you taken any medicines to relieve your heartburn in the past? Answer:   H2 Blockers (Pepcid, Tagament, Zantac, Axid)            PPIs (Prilosec, Nexium, Prevacid, Prontonix)  Question: Have the medicines provided relief? Answer:   No  Question: Have you had any of the following for heartburn? Answer:   None of the above  Question: Please list your medication allergies that you may have ? (If 'none' , please list as 'none') Answer:   Omeprazole and Equate Famotidine.  Question: Please list any additional comments  Answer:

## 2019-03-28 ENCOUNTER — Encounter: Payer: Self-pay | Admitting: Internal Medicine

## 2019-03-28 ENCOUNTER — Other Ambulatory Visit: Payer: Self-pay

## 2019-03-28 ENCOUNTER — Ambulatory Visit: Payer: Commercial Managed Care - PPO | Admitting: Internal Medicine

## 2019-03-28 VITALS — BP 150/82 | HR 63 | Temp 97.8°F | Resp 16 | Ht 59.0 in | Wt 126.8 lb

## 2019-03-28 DIAGNOSIS — R1013 Epigastric pain: Secondary | ICD-10-CM | POA: Diagnosis not present

## 2019-03-28 DIAGNOSIS — I10 Essential (primary) hypertension: Secondary | ICD-10-CM | POA: Diagnosis not present

## 2019-03-28 DIAGNOSIS — K219 Gastro-esophageal reflux disease without esophagitis: Secondary | ICD-10-CM | POA: Diagnosis not present

## 2019-03-28 DIAGNOSIS — R079 Chest pain, unspecified: Secondary | ICD-10-CM | POA: Insufficient documentation

## 2019-03-28 MED ORDER — RANITIDINE HCL 150 MG PO TABS: 150.0000 mg | ORAL_TABLET | Freq: Two times a day (BID) | ORAL | 1 refills | Status: DC

## 2019-03-28 MED ORDER — OLMESARTAN MEDOXOMIL 5 MG PO TABS: 5.0000 mg | ORAL_TABLET | Freq: Every day | ORAL | 5 refills | Status: DC

## 2019-03-28 NOTE — Patient Instructions (Addendum)
For your shoulder pain take Tylenol and use the flexeril ( muscle relaxer) at night.  You can apply heat or ice.  You can also apply a topical muscle or arthritis cream.   For your blood pressure start olmesartan 5 mg daily.  Monitor your blood pressure at home and keep a log.     For your heartburn.  Stop the omeprazole and start Ranitidine 150 mg twice daily.

## 2019-03-28 NOTE — Assessment & Plan Note (Signed)
Blood pressure here has been elevated on the last 3 visits She does monitor her blood pressure at home and it is variable Had nosebleeds after starting amlodipine-?  Related or not Discussed that I think she needs a low dose of medication, but she is concerned about taking something because her blood pressure is okay at home most the time Will try Benicar 5 mg daily-discussed with her this is a baby dose She will monitor her blood pressure at home and keep a log of her blood pressures She will bring her blood pressure log and monitor into her next appointment in July She will call or MyChart with any questions or concerns

## 2019-03-28 NOTE — Assessment & Plan Note (Signed)
GERD fairly controlled She did have an episode couple of days ago that radiated up to her left chest She does have some very mild epigastric discomfort and is worried about an ulcer She feels ranitidine was more effective than omeprazole and would ideally like to switch back Discontinue omeprazole Start ranitidine 150 mg twice daily

## 2019-03-28 NOTE — Assessment & Plan Note (Signed)
She had a couple of episodes of chest pain that were not related None of them typical for cardiac pain Her first episode it is difficult to say the cause, second episode and persistent right upper back pain is muscular skeletal in nature and her most recent episode sounds like it was GERD related EKG here today shows normal sinus rhythm at 66 bpm, normal EKG-there is no change compared to her EKG from 2017 She was reassured that these pains were unlikely cardiac in nature She will call or return with any questions or concerns

## 2019-03-28 NOTE — Assessment & Plan Note (Signed)
She has intermittent mild epigastric pain at times She is concerned this could be a gastric ulcer She has been seen for this in the past She does feel that the ranitidine helps more with her GERD and this pain than the omeprazole We will discontinue omeprazole and restart ranitidine 150 mg twice daily If this discomfort continues discussed that I will refer her to GI for possible EGD

## 2019-05-07 ENCOUNTER — Other Ambulatory Visit: Payer: Self-pay

## 2019-05-07 ENCOUNTER — Ambulatory Visit
Admission: EM | Admit: 2019-05-07 | Discharge: 2019-05-07 | Disposition: A | Discharge status: Home / Self Care | Payer: Commercial Managed Care - PPO | Attending: Physician Assistant | Admitting: Physician Assistant

## 2019-05-07 ENCOUNTER — Encounter: Payer: Self-pay | Admitting: Emergency Medicine

## 2019-05-07 DIAGNOSIS — R432 Parageusia: Secondary | ICD-10-CM

## 2019-05-07 DIAGNOSIS — R0602 Shortness of breath: Secondary | ICD-10-CM

## 2019-05-07 DIAGNOSIS — R42 Dizziness and giddiness: Secondary | ICD-10-CM

## 2019-05-07 MED ORDER — MECLIZINE HCL 12.5 MG PO TABS: 12.5000 mg | ORAL_TABLET | Freq: Three times a day (TID) | ORAL | 0 refills | Status: DC | PRN

## 2019-05-07 NOTE — ED Triage Notes (Addendum)
Pt presents to [facility] for assessment of 1 week of fatigue, shortness of breath and malaise.  Denies cough, fever, sore throat.  C/o dizziness, c/o loss of sense of taste

## 2019-05-07 NOTE — ED Provider Notes (Signed)
c [facility]    CSN: [id number] Arrival date & time: 05/07/19  1530     History   Chief Complaint Chief Complaint  Patient presents with  . Shortness of Breath    HPI Maria Barker is a 58 y.o. female.   58 year old female comes in for 1 to 2-week history of fatigue, shortness of breath, malaise.  Denies cough, congestion, rhinorrhea, sore throat, body aches.  Denies fever, chills, night sweats.  She has had headaches intermittently, loss of sense of taste.  Today, started noticing some dizziness which she describes as her spinning.  Denies lightheadedness, weakness, syncope.  She has not taken anything for the symptoms.  No obvious sick contact.  No known COVID contacts.     Past Medical History:  Diagnosis Date  . Arthritis    R THR 2011  . Back pain   . Frequent headaches   . Hyperlipidemia LDL goal < 130   . Hypothyroid 02/06/2015   Dx 01/2015: TSH 5.4  . Insomnia   . Left hip pain   . Pain of left thigh     Patient Active Problem List   Diagnosis Date Noted  . Chest pain 03/28/2019  . Epigastric pain 03/28/2019  . Rash on lips 01/08/2019  . Essential hypertension 01/08/2019  . Upper back pain on left side 11/27/2018  . Fatigue 03/16/2018  . Rash and nonspecific skin eruption 03/16/2018  . De Quervain's tenosynovitis, right 03/09/2018  . Chronic nonintractable headache 03/09/2018  . Pain of upper abdomen 01/17/2018  . Prediabetes 09/17/2017  . Chronic pain of left knee 04/12/2017  . B12 deficiency 08/13/2016  . S/P bilateral hip replacements 08/13/2016  . Family history of brain aneurysm 08/12/2015  . Gastroesophageal reflux disease without esophagitis 08/12/2015  . Hypothyroid 02/06/2015  . Hyperlipidemia   . Chronic insomnia 04/13/2013    Past Surgical History:  Procedure Laterality Date  . 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 TOTAL HIP ARTHROPLASTY Left 03/08/2016   Procedure: TOTAL HIP ARTHROPLASTY;  Surgeon: Frederik Pear, MD;   Location: [place];  Service: Orthopedics;  Laterality: Left;  . TUBAL LIGATION    . UPPER GASTROINTESTINAL ENDOSCOPY      OB History   No obstetric history on file.      Home Medications    Prior to Admission medications   Medication Sig Start Date End Date Taking? Authorizing Provider  ranitidine (ZANTAC) 150 MG tablet Take 1 tablet (150 mg total) by mouth 2 (two) times daily. 03/28/19  Yes Burns, Claudina Lick, MD  atorvastatin (LIPITOR) 10 MG tablet TAKE 1 TABLET BY MOUTH ONCE DAILY 02/01/19   Binnie Rail, MD  Calcium Carbonate-Vitamin D (CALCIUM-VITAMIN D3 PO) Take 1 each by mouth 2 (two) times daily.    [provider]  cyclobenzaprine (FLEXERIL) 5 MG tablet Take 1 tablet (5 mg total) by mouth at bedtime. 11/27/18   Binnie Rail, MD  levothyroxine (SYNTHROID, LEVOTHROID) 50 MCG tablet Take 1 tablet by mouth 6 days a week. 01/24/19   Binnie Rail, MD  meclizine (ANTIVERT) 12.5 MG tablet Take 1 tablet (12.5 mg total) by mouth 3 (three) times daily as needed for dizziness. 05/07/19   Tasia Catchings, Amy V, PA-C  olmesartan (BENICAR) 5 MG tablet Take 1 tablet (5 mg total) by mouth daily. 03/28/19   Binnie Rail, MD  Omega-3 1000 MG CAPS Take 2,000 mg by mouth.    [provider]  vitamin B-12 (CYANOCOBALAMIN)  1000 MCG tablet Take 1,000 mcg by mouth daily.    [provider]  zolpidem (AMBIEN) 5 MG tablet TAKE 1 TABLET BY MOUTH AT BEDTIME AS NEEDED FOR SLEEP 01/24/19   Binnie Rail, MD    Family History Family History  Problem Relation Age of Onset  . CVA Mother   . Hypertension Mother     Social History Social History   Tobacco Use  . Smoking status: Never Smoker  . Smokeless tobacco: Never Used  Substance Use Topics  . Alcohol use: No  . Drug use: No     Allergies   Asa [aspirin]   Review of Systems Review of Systems  Reason unable to perform ROS: See HPI as above.     Physical Exam Triage Vital Signs ED Triage Vitals [05/07/19 1547]  Enc Vitals  Group     BP (!) 158/90     Pulse Rate 65     Resp (!) 22     Temp 97.9 F (36.6 C)     Temp Source Oral     SpO2 98 %     Weight      Height      Head Circumference      Peak Flow      Pain Score 0     Pain Loc      Pain Edu?      Excl. in [place]?    No data found.  Updated Vital Signs BP (!) 158/90 (BP Location: Left Arm)   Pulse 65   Temp 97.9 F (36.6 C) (Oral)   Resp (!) 22   SpO2 98%   Physical Exam Constitutional:      General: She is not in acute distress.    Appearance: Normal appearance. She is not ill-appearing, toxic-appearing or diaphoretic.  HENT:     Head: Normocephalic and atraumatic.     Mouth/Throat:     Mouth: Mucous membranes are moist.     Pharynx: Oropharynx is clear. Uvula midline.  Neck:     Musculoskeletal: Normal range of motion and neck supple.  Cardiovascular:     Rate and Rhythm: Normal rate and regular rhythm.     Heart sounds: Normal heart sounds. No murmur. No friction rub. No gallop.   Pulmonary:     Effort: Pulmonary effort is normal. No accessory muscle usage, prolonged expiration, respiratory distress or retractions.     Comments: Lungs clear to auscultation without adventitious lung sounds. Neurological:     General: No focal deficit present.     Mental Status: She is alert and oriented to person, place, and time.     GCS: GCS eye subscore is 4. GCS verbal subscore is 5. GCS motor subscore is 6.     Coordination: Coordination is intact.     Gait: Gait is intact.      UC Treatments / Results  Labs (all labs ordered are listed, but only abnormal results are displayed) Labs Reviewed - No data to display  EKG None  Radiology No results found.  Procedures Procedures (including critical care time)  Medications Ordered in UC Medications - No data to display  Initial Impression / Assessment and Plan / UC Course  I have reviewed the triage vital signs and the nursing notes.  Pertinent labs & imaging results that were  available during my care of the patient were reviewed by me and considered in my medical decision making (see chart for details).    Patient speaking in  full sentences without respiratory distress. Afebrile without antipyretic in last 8 hours. No tachycardia, tachypnea. Lungs CTAB. COVID testing limited to inpatient. Neurology exam grossly intact. Given history and exam, will have patient self quarantine. Instructions on when to end quarantine, family member isolation discussed, and resources provided. Symptomatic treatment discussed. Return precautions given. Patient expresses understanding and agrees to plan. With patient's permission, also discussed plan with son, who expresses understanding and agrees to plan.  Final Clinical Impressions(s) / UC Diagnoses   Final diagnoses:  Shortness of breath  Dizziness  Loss of taste   ED Prescriptions    Medication Sig Dispense Auth. Provider   meclizine (ANTIVERT) 12.5 MG tablet Take 1 tablet (12.5 mg total) by mouth 3 (three) times daily as needed for dizziness. 30 tablet Tobin Chad, [place] 05/07/19 1658

## 2019-05-07 NOTE — Discharge Instructions (Signed)
As discussed, cannot rule out COVID. Currently, no alarming signs. Start meclizine for dizziness. I would like you to quarantine for 7 days since symptoms onset AND >72 hours without fever, and improved respiratory symptoms. Continue to monitor, you can call COVID hotline [phone]) or use [facility] visit online if symptoms worsens to determine where you should seek care. If experiencing shortness of breath, trouble breathing, call 911 and provide them with your current situation.

## 2019-05-07 NOTE — ED Notes (Signed)
Patient able to ambulate independently  

## 2019-05-16 ENCOUNTER — Encounter: Payer: Self-pay | Admitting: Internal Medicine

## 2019-05-16 ENCOUNTER — Ambulatory Visit (INDEPENDENT_AMBULATORY_CARE_PROVIDER_SITE_OTHER): Payer: Commercial Managed Care - PPO | Admitting: Internal Medicine

## 2019-05-16 DIAGNOSIS — R5383 Other fatigue: Secondary | ICD-10-CM | POA: Diagnosis not present

## 2019-05-16 DIAGNOSIS — R51 Headache: Secondary | ICD-10-CM | POA: Diagnosis not present

## 2019-05-16 DIAGNOSIS — R519 Headache, unspecified: Secondary | ICD-10-CM | POA: Insufficient documentation

## 2019-05-16 DIAGNOSIS — R432 Parageusia: Secondary | ICD-10-CM | POA: Insufficient documentation

## 2019-05-16 NOTE — Assessment & Plan Note (Signed)
Fatigue over the past 2 weeks associated with loss of taste, headaches, shortness of breath for couple of days and some dizziness that has not resolved Concern for the possibility of COVID-19 especially given the lack of taste She has not had some of the other typical symptoms, but I did strongly recommend that she get tested-given number for [facility] [place] and her son will see if they can get that done this week If she is negative I will order blood work and have her come to the office to have that done At this time symptomatic treatment

## 2019-05-16 NOTE — Assessment & Plan Note (Signed)
Having daily headaches since last week Possibly related to COVID-19-we will get tested She also stopped her blood pressure medication thinking that was the cause, but has not helped Blood pressure seems to be well controlled at home, but I am suspicious if her cuff is accurate.  Her son will try to get her new cuff and she can restart her medication if her blood pressure is elevated, which could also be contributing to the headaches Continue Tylenol at this point If COVID-19 is negative will consider blood work or having her come into the office for further evaluation

## 2019-05-16 NOTE — Assessment & Plan Note (Signed)
2 weeks ago she had a sudden loss of taste Her symptoms that she is experiencing could possibly be COVID-19 and a strongly advised that she get tested Her symptoms are not convincing for a sinus infection or other infectious cause of the loss of taste Her taste has improved, but is not back at baseline If her COVID-19 test is negative she will let me know and we can evaluate further

## 2019-05-16 NOTE — Progress Notes (Signed)
Virtual Visit via Video Note  I connected with Maria Barker on 05/16/19 at  2:30 PM EDT by a video enabled telemedicine application and verified that I am speaking with the correct person using two identifiers.   I discussed the limitations of evaluation and management by telemedicine and the availability of in person appointments. The patient expressed understanding and agreed to proceed.  The patient is currently at home and I am in the office.  Her son is with her who helped provide some of the history and does some interpreting.  No referring provider.    History of Present Illness: This is an acute visit for feeling bad for a couple of weeks.  Her symptoms started a couple of weeks ago.  On 5/11 she did go to urgent care for her symptoms.  She did not have any testing done at that time, but was prescribed meclizine because she was having some dizzy spells.  She states a couple weeks ago she started become very fatigued, had a loss of taste, had some dizziness, shortness of breath for a couple of days.  The shortness of breath only lasted those couple of days and has not recurred.  The dizziness has improved.  Her big concern is the persistent fatigue and she has been having severe headaches since last week they tend to last all day.  She is taking an allergy medication and it is not working.  She thought maybe her blood pressure medication was causing the headaches so she stopped taking it.  She did check her blood pressure at home and it has been good, but her blood pressure always tends to be high here at the office and good at home.  He has been taking extra strength Tylenol that has not helped.  Today she woke up in a hot sweat.    Review of Systems  Constitutional: Positive for diaphoresis (this morning) and malaise/fatigue. Negative for fever.  HENT: Positive for sinus pain. Negative for congestion, ear pain and sore throat.        Sense of taste - lost and now decreased  Eyes:  Negative for blurred vision and double vision.  Respiratory: Positive for shortness of breath (5/10-5/11 only ). Negative for cough and wheezing.   Cardiovascular: Negative for chest pain and palpitations.  Gastrointestinal: Negative for diarrhea and nausea.  Genitourinary: Negative for dysuria, frequency and hematuria.  Musculoskeletal: Negative for myalgias.  Neurological: Positive for headaches. Negative for dizziness.     Social History   Socioeconomic History  . Marital status: Married    Spouse name: Not on file  . Number of children: 5  . Years of education: Not on file  . Highest education level: Not on file  Occupational History  . Occupation: Glass blower/designer    Employer: [employer]  Social Needs  . Financial resource strain: Not on file  . Food insecurity:    Worry: Not on file    Inability: Not on file  . Transportation needs:    Medical: Not on file    Non-medical: Not on file  Tobacco Use  . Smoking status: Never Smoker  . Smokeless tobacco: Never Used  Substance and Sexual Activity  . Alcohol use: No  . Drug use: No  . Sexual activity: Not on file  Lifestyle  . Physical activity:    Days per week: Not on file    Minutes per session: Not on file  . Stress: Not on file  Relationships  .  Social connections:    Talks on phone: Not on file    Gets together: Not on file    Attends religious service: Not on file    Active member of club or organization: Not on file    Attends meetings of clubs or organizations: Not on file    Relationship status: Not on file  Other Topics Concern  . Not on file  Social History Narrative  . Not on file     Observations/Objective: Appears well in NAD  BP today 127/75 at home   Assessment and Plan:  See Problem List for Assessment and Plan of chronic medical problems.   Follow Up Instructions:    I discussed the assessment and treatment plan with the patient. The patient was provided an opportunity to ask  questions and all were answered. The patient agreed with the plan and demonstrated an understanding of the instructions.   The patient was advised to call back or seek an in-person evaluation if the symptoms worsen or if the condition fails to improve as anticipated.    Binnie Rail, MD

## 2019-05-18 ENCOUNTER — Other Ambulatory Visit: Payer: Self-pay

## 2019-05-18 ENCOUNTER — Ambulatory Visit
Admission: EM | Admit: 2019-05-18 | Discharge: 2019-05-18 | Disposition: A | Discharge status: Home / Self Care | Payer: Commercial Managed Care - PPO | Attending: Family Medicine | Admitting: Family Medicine

## 2019-05-18 DIAGNOSIS — K219 Gastro-esophageal reflux disease without esophagitis: Secondary | ICD-10-CM

## 2019-05-18 DIAGNOSIS — K2970 Gastritis, unspecified, without bleeding: Secondary | ICD-10-CM | POA: Diagnosis not present

## 2019-05-18 DIAGNOSIS — K2990 Gastroduodenitis, unspecified, without bleeding: Secondary | ICD-10-CM | POA: Diagnosis not present

## 2019-05-18 MED ORDER — SUCRALFATE 1 GM/10ML PO SUSP: 1.0000 g | Freq: Three times a day (TID) | ORAL | 0 refills | Status: DC

## 2019-05-18 NOTE — Discharge Instructions (Addendum)
Stop the Zantac (ranitidine)

## 2019-05-18 NOTE — ED Triage Notes (Signed)
Pt c/o epigastric pain this am and had 3 BMs this morning, feeling week. States has an appt for COVID 19 testing next Tuesday.

## 2019-05-18 NOTE — ED Provider Notes (Signed)
[facility]    CSN: [id number] Arrival date & time: 05/18/19  1051     History   Chief Complaint Chief Complaint  Patient presents with  . Abdominal Pain    HPI Maria Barker is a 58 y.o. female.   Pt c/o epigastric pain this am and had 3 BMs this morning, feeling week. States has an appt for COVID 19 testing next Tuesday. The pain is crampy and sharp at times.  No fever or blood in stool.  Long h/o GERD.  One week of epigastric burning unrelieved by Tums, Zantac or Prilosec.     Past Medical History:  Diagnosis Date  . Arthritis    R THR 2011  . Back pain   . Frequent headaches   . Hyperlipidemia LDL goal < 130   . Hypothyroid 02/06/2015   Dx 01/2015: TSH 5.4  . Insomnia   . Left hip pain   . Pain of left thigh     Patient Active Problem List   Diagnosis Date Noted  . Nonintractable headache 05/16/2019  . Loss of taste 05/16/2019  . Chest pain 03/28/2019  . Epigastric pain 03/28/2019  . Rash on lips 01/08/2019  . Essential hypertension 01/08/2019  . Upper back pain on left side 11/27/2018  . Fatigue 03/16/2018  . Rash and nonspecific skin eruption 03/16/2018  . De Quervain's tenosynovitis, right 03/09/2018  . Chronic nonintractable headache 03/09/2018  . Pain of upper abdomen 01/17/2018  . Prediabetes 09/17/2017  . Chronic pain of left knee 04/12/2017  . B12 deficiency 08/13/2016  . S/P bilateral hip replacements 08/13/2016  . Family history of brain aneurysm 08/12/2015  . Gastroesophageal reflux disease without esophagitis 08/12/2015  . Hypothyroid 02/06/2015  . Hyperlipidemia   . Chronic insomnia 04/13/2013    Past Surgical History:  Procedure Laterality Date  . 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 TOTAL HIP ARTHROPLASTY Left 03/08/2016   Procedure: TOTAL HIP ARTHROPLASTY;  Surgeon: Frederik Pear, MD;  Location: [place];  Service: Orthopedics;  Laterality: Left;  . TUBAL LIGATION    . UPPER GASTROINTESTINAL ENDOSCOPY      OB  History   No obstetric history on file.      Home Medications    Prior to Admission medications   Medication Sig Start Date End Date Taking? Authorizing Provider  atorvastatin (LIPITOR) 10 MG tablet TAKE 1 TABLET BY MOUTH ONCE DAILY 02/01/19   Binnie Rail, MD  Calcium Carbonate-Vitamin D (CALCIUM-VITAMIN D3 PO) Take 1 each by mouth 2 (two) times daily.    [provider]  cyclobenzaprine (FLEXERIL) 5 MG tablet Take 1 tablet (5 mg total) by mouth at bedtime. 11/27/18   Binnie Rail, MD  levothyroxine (SYNTHROID, LEVOTHROID) 50 MCG tablet Take 1 tablet by mouth 6 days a week. 01/24/19   Binnie Rail, MD  meclizine (ANTIVERT) 12.5 MG tablet Take 1 tablet (12.5 mg total) by mouth 3 (three) times daily as needed for dizziness. 05/07/19   Tasia Catchings, Amy V, PA-C  olmesartan (BENICAR) 5 MG tablet Take 1 tablet (5 mg total) by mouth daily. 03/28/19   Binnie Rail, MD  Omega-3 1000 MG CAPS Take 2,000 mg by mouth.    [provider]  sucralfate (CARAFATE) 1 GM/10ML suspension Take 10 mLs (1 g total) by mouth 4 (four) times daily -  with meals and at bedtime. 05/18/19   Robyn Haber, MD  vitamin B-12 (CYANOCOBALAMIN) 1000 MCG tablet Take 1,000 mcg by  mouth daily.    [provider]  zolpidem (AMBIEN) 5 MG tablet TAKE 1 TABLET BY MOUTH AT BEDTIME AS NEEDED FOR SLEEP 01/24/19   Binnie Rail, MD    Family History Family History  Problem Relation Age of Onset  . CVA Mother   . Hypertension Mother     Social History Social History   Tobacco Use  . Smoking status: Never Smoker  . Smokeless tobacco: Never Used  Substance Use Topics  . Alcohol use: No  . Drug use: No     Allergies   Asa [aspirin]   Review of Systems Review of Systems  Constitutional: Negative.   Respiratory: Negative.   Gastrointestinal: Positive for abdominal pain.  All other systems reviewed and are negative.    Physical Exam Triage Vital Signs ED Triage Vitals [05/18/19 1100]  Enc  Vitals Group     BP (!) 145/78     Pulse Rate 78     Resp 18     Temp 98.5 F (36.9 C)     Temp Source Oral     SpO2 97 %     Weight      Height      Head Circumference      Peak Flow      Pain Score 5     Pain Loc      Pain Edu?      Excl. in [place]?    No data found.  Updated Vital Signs BP (!) 145/78 (BP Location: Left Arm)   Pulse 78   Temp 98.5 F (36.9 C) (Oral)   Resp 18   SpO2 97%    Physical Exam Vitals signs and nursing note reviewed.  Constitutional:      Appearance: She is well-developed and normal weight.  HENT:     Head: Normocephalic.     Mouth/Throat:     Mouth: Mucous membranes are moist.     Pharynx: Oropharynx is clear.  Cardiovascular:     Rate and Rhythm: Normal rate and regular rhythm.  Pulmonary:     Effort: Pulmonary effort is normal.     Breath sounds: Normal breath sounds.  Abdominal:     General: Abdomen is flat. Bowel sounds are normal.     Palpations: Abdomen is soft. There is no mass.     Tenderness: There is abdominal tenderness in the epigastric area. There is no guarding or rebound.  Skin:    General: Skin is warm.  Neurological:     General: No focal deficit present.     Mental Status: She is alert.  Psychiatric:        Mood and Affect: Mood normal.        Behavior: Behavior normal.      UC Treatments / Results  Labs (all labs ordered are listed, but only abnormal results are displayed) Labs Reviewed - No data to display  EKG None  Radiology No results found.  Procedures Procedures (including critical care time)  Medications Ordered in UC Medications - No data to display  Initial Impression / Assessment and Plan / UC Course  I have reviewed the triage vital signs and the nursing notes.  Pertinent labs & imaging results that were available during my care of the patient were reviewed by me and considered in my medical decision making (see chart for details).    Final Clinical Impressions(s) / UC Diagnoses    Final diagnoses:  Gastritis and gastroduodenitis  Gastroesophageal reflux disease, esophagitis  presence not specified     Discharge Instructions     Stop the Zantac (ranitidine)    ED Prescriptions    Medication Sig Dispense Auth. Provider   sucralfate (CARAFATE) 1 GM/10ML suspension Take 10 mLs (1 g total) by mouth 4 (four) times daily -  with meals and at bedtime. 420 mL Robyn Haber, MD     Controlled Substance Prescriptions [place] Controlled Substance Registry consulted? Not Applicable   Robyn Haber, MD 05/18/19 1128

## 2019-06-21 ENCOUNTER — Other Ambulatory Visit: Payer: Self-pay | Admitting: Internal Medicine

## 2019-06-21 NOTE — Telephone Encounter (Signed)
Last OV 01/08/19 Last RF 01/24/19 Next OV 07/11/19

## 2019-07-10 NOTE — Patient Instructions (Addendum)
  Tests ordered today. Your results will be released to MyChart (or called to you) after review.  If any changes need to be made, you will be notified at that same time.    Medications reviewed and updated.  Changes include :     Your prescription(s) have been submitted to your pharmacy. Please take as directed and contact our office if you believe you are having problem(s) with the medication(s).  A referral was ordered for   Please followup in 6 months   

## 2019-07-10 NOTE — Progress Notes (Deleted)
Subjective:    Patient ID: Maria Barker, female    DOB: 1961/01/13, 58 y.o.   MRN: [id number]  HPI The patient is here for follow up.   She is not exercising regularly.     Hypertension: She is taking her medication daily. She is compliant with a low sodium diet.  She denies chest pain, palpitations, edema, shortness of breath and regular headaches.    Hyperlipidemia: She is taking her medication daily. She is compliant with a low fat/cholesterol diet. She denies myalgias.   Prediabetes:  She is compliant with a low sugar/carbohydrate diet.  She is not exercising regularly.  Hypothyroidism:  She is taking her medication daily.  She denies any recent changes in energy or weight that are unexplained.   Insomnia:  She take ambien nightly.  She sleep well some nights. The Lorrin Mais works better than any other medications.      Review of Systems  Constitutional: Negative for chills and fever.  HENT: Positive for sore throat (mild - no A/C at work - has fan - occurs every year).   Respiratory: Negative for cough, shortness of breath and wheezing.   Cardiovascular: Negative for chest pain, palpitations and leg swelling.  Neurological: Positive for headaches (work related). Negative for dizziness.     Medications and allergies reviewed with patient and updated if appropriate.  Patient Active Problem List   Diagnosis Date Noted  . Nonintractable headache 05/16/2019  . Loss of taste 05/16/2019  . Chest pain 03/28/2019  . Epigastric pain 03/28/2019  . Rash on lips 01/08/2019  . Essential hypertension 01/08/2019  . Upper back pain on left side 11/27/2018  . Fatigue 03/16/2018  . Rash and nonspecific skin eruption 03/16/2018  . De Quervain's tenosynovitis, right 03/09/2018  . Chronic nonintractable headache 03/09/2018  . Pain of upper abdomen 01/17/2018  . Prediabetes 09/17/2017  . Chronic pain of left knee 04/12/2017  . B12 deficiency 08/13/2016  . S/P bilateral hip  replacements 08/13/2016  . Family history of brain aneurysm 08/12/2015  . Gastroesophageal reflux disease without esophagitis 08/12/2015  . Hypothyroid 02/06/2015  . Hyperlipidemia   . Chronic insomnia 04/13/2013    Current Outpatient Medications on File Prior to Visit  Medication Sig Dispense Refill  . atorvastatin (LIPITOR) 10 MG tablet TAKE 1 TABLET BY MOUTH ONCE DAILY 90 tablet 0  . Calcium Carbonate-Vitamin D (CALCIUM-VITAMIN D3 PO) Take 1 each by mouth 2 (two) times daily.    . cyclobenzaprine (FLEXERIL) 5 MG tablet Take 1 tablet (5 mg total) by mouth at bedtime. 30 tablet 1  . levothyroxine (SYNTHROID, LEVOTHROID) 50 MCG tablet Take 1 tablet by mouth 6 days a week. 90 tablet 0  . meclizine (ANTIVERT) 12.5 MG tablet Take 1 tablet (12.5 mg total) by mouth 3 (three) times daily as needed for dizziness. 30 tablet 0  . olmesartan (BENICAR) 5 MG tablet Take 1 tablet (5 mg total) by mouth daily. 30 tablet 5  . Omega-3 1000 MG CAPS Take 2,000 mg by mouth.    . sucralfate (CARAFATE) 1 GM/10ML suspension Take 10 mLs (1 g total) by mouth 4 (four) times daily -  with meals and at bedtime. 420 mL 0  . vitamin B-12 (CYANOCOBALAMIN) 1000 MCG tablet Take 1,000 mcg by mouth daily.    Marland Kitchen zolpidem (AMBIEN) 5 MG tablet TAKE 1 TABLET BY MOUTH AT BEDTIME AS NEEDED FOR SLEEP 30 tablet 0   No current facility-administered medications on file prior to visit.  Past Medical History:  Diagnosis Date  . Arthritis    R THR 2011  . Back pain   . Frequent headaches   . Hyperlipidemia LDL goal < 130   . Hypothyroid 02/06/2015   Dx 01/2015: TSH 5.4  . Insomnia   . Left hip pain   . Pain of left thigh     Past Surgical History:  Procedure Laterality Date  . 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 TOTAL HIP ARTHROPLASTY Left 03/08/2016   Procedure: TOTAL HIP ARTHROPLASTY;  Surgeon: Frederik Pear, MD;  Location: [place];  Service: Orthopedics;  Laterality: Left;  . TUBAL LIGATION    . UPPER  GASTROINTESTINAL ENDOSCOPY      Social History   Socioeconomic History  . Marital status: Married    Spouse name: Not on file  . Number of children: 5  . Years of education: Not on file  . Highest education level: Not on file  Occupational History  . Occupation: Glass blower/designer    Employer: [employer]  Social Needs  . Financial resource strain: Not on file  . Food insecurity    Worry: Not on file    Inability: Not on file  . Transportation needs    Medical: Not on file    Non-medical: Not on file  Tobacco Use  . Smoking status: Never Smoker  . Smokeless tobacco: Never Used  Substance and Sexual Activity  . Alcohol use: No  . Drug use: No  . Sexual activity: Not on file  Lifestyle  . Physical activity    Days per week: Not on file    Minutes per session: Not on file  . Stress: Not on file  Relationships  . Social Herbalist on phone: Not on file    Gets together: Not on file    Attends religious service: Not on file    Active member of club or organization: Not on file    Attends meetings of clubs or organizations: Not on file    Relationship status: Not on file  Other Topics Concern  . Not on file  Social History Narrative  . Not on file    Family History  Problem Relation Age of Onset  . CVA Mother   . Hypertension Mother     Review of Systems  Constitutional: Negative for chills and fever.  HENT: Positive for sore throat (mild - no A/C at work - has fan - occurs every year).   Respiratory: Negative for cough, shortness of breath and wheezing.   Cardiovascular: Negative for chest pain, palpitations and leg swelling.  Neurological: Positive for headaches (work related). Negative for dizziness.       Objective:  There were no vitals filed for this visit. BP Readings from Last 3 Encounters:  05/18/19 (!) 145/78  05/07/19 (!) 158/90  03/28/19 (!) 150/82   Wt Readings from Last 3 Encounters:  03/28/19 126 lb 12.8 oz (57.5 kg)   01/08/19 127 lb (57.6 kg)  11/27/18 126 lb (57.2 kg)   There is no height or weight on file to calculate BMI.   Physical Exam    Constitutional: Appears well-developed and well-nourished. No distress.  HENT:  Head: Normocephalic and atraumatic.  Neck: Neck supple. No tracheal deviation present. No thyromegaly present.  No cervical lymphadenopathy Cardiovascular: Normal rate, regular rhythm and normal heart sounds.   No murmur heard. No carotid bruit .  No edema Pulmonary/Chest: Effort normal and breath sounds  normal. No respiratory distress. No has no wheezes. No rales.  Skin: Skin is warm and dry. Not diaphoretic.  Psychiatric: Normal mood and affect. Behavior is normal.      Assessment & Plan:    See Problem List for Assessment and Plan of chronic medical problems.

## 2019-07-11 ENCOUNTER — Encounter: Payer: Self-pay | Admitting: Internal Medicine

## 2019-07-11 ENCOUNTER — Ambulatory Visit (INDEPENDENT_AMBULATORY_CARE_PROVIDER_SITE_OTHER): Payer: Commercial Managed Care - PPO | Admitting: Internal Medicine

## 2019-07-11 DIAGNOSIS — E038 Other specified hypothyroidism: Secondary | ICD-10-CM | POA: Diagnosis not present

## 2019-07-11 DIAGNOSIS — E782 Mixed hyperlipidemia: Secondary | ICD-10-CM

## 2019-07-11 DIAGNOSIS — F5104 Psychophysiologic insomnia: Secondary | ICD-10-CM

## 2019-07-11 DIAGNOSIS — I10 Essential (primary) hypertension: Secondary | ICD-10-CM | POA: Diagnosis not present

## 2019-07-11 DIAGNOSIS — R7303 Prediabetes: Secondary | ICD-10-CM

## 2019-07-11 MED ORDER — CYCLOBENZAPRINE HCL 5 MG PO TABS: 5.0000 mg | ORAL_TABLET | Freq: Every day | ORAL | 1 refills | Status: DC

## 2019-07-11 MED ORDER — ATORVASTATIN CALCIUM 10 MG PO TABS: 10.0000 mg | ORAL_TABLET | Freq: Every day | ORAL | 1 refills | Status: DC

## 2019-07-11 NOTE — Assessment & Plan Note (Signed)
Clinically euthyroid Continue current dose of medication  

## 2019-07-11 NOTE — Assessment & Plan Note (Signed)
BP Readings from Last 3 Encounters:  05/18/19 (!) 145/78  05/07/19 (!) 158/90  03/28/19 (!) 150/82   ? Controlled or not BP typically controlled at home Since this is virtual will not make any changes

## 2019-07-11 NOTE — Assessment & Plan Note (Signed)
Maria Barker works fairly well continue

## 2019-07-11 NOTE — Assessment & Plan Note (Signed)
Continue statin. 

## 2019-07-11 NOTE — Assessment & Plan Note (Signed)
Lab Results  Component Value Date   HGBA1C 6.3 01/08/2019    Low sugar / carb diet Stressed regular exercise

## 2019-07-11 NOTE — Progress Notes (Signed)
Virtual Visit via Video Note  I connected with Maria Barker on 07/11/19 at  9:45 AM EDT by a video enabled telemedicine application and verified that I am speaking with the correct person using two identifiers.   I discussed the limitations of evaluation and management by telemedicine and the availability of in person appointments. The patient expressed understanding and agreed to proceed.  The patient is currently at home and I am in the office.    No referring provider.    History of Present Illness: The patient is here for follow up.   She is not exercising regularly.     Hypertension: She is taking her medication daily. She is compliant with a low sodium diet.  She denies chest pain, palpitations, edema, shortness of breath and regular headaches.    Hyperlipidemia: She is taking her medication daily. She is compliant with a low fat/cholesterol diet. She denies myalgias.   Prediabetes:  She is compliant with a low sugar/carbohydrate diet.  She is not exercising regularly.  Hypothyroidism:  She is taking her medication daily.  She denies any recent changes in energy or weight that are unexplained.   Insomnia:  She take ambien nightly.  She sleep well some nights. The Lorrin Mais works better than any other medications.     Review of Systems  Constitutional: Positive for malaise/fatigue. Negative for chills and fever.  Respiratory: Negative for cough, shortness of breath and wheezing.   Cardiovascular: Negative for chest pain, palpitations and leg swelling.  Neurological: Negative for dizziness and headaches.     Social History   Socioeconomic History  . Marital status: Married    Spouse name: Not on file  . Number of children: 5  . Years of education: Not on file  . Highest education level: Not on file  Occupational History  . Occupation: Glass blower/designer    Employer: [employer]  Social Needs  . Financial resource strain: Not on file  . Food insecurity    Worry:  Not on file    Inability: Not on file  . Transportation needs    Medical: Not on file    Non-medical: Not on file  Tobacco Use  . Smoking status: Never Smoker  . Smokeless tobacco: Never Used  Substance and Sexual Activity  . Alcohol use: No  . Drug use: No  . Sexual activity: Not on file  Lifestyle  . Physical activity    Days per week: Not on file    Minutes per session: Not on file  . Stress: Not on file  Relationships  . Social Herbalist on phone: Not on file    Gets together: Not on file    Attends religious service: Not on file    Active member of club or organization: Not on file    Attends meetings of clubs or organizations: Not on file    Relationship status: Not on file  Other Topics Concern  . Not on file  Social History Narrative  . Not on file     Observations/Objective: Appears well in NAD   Assessment and Plan:  See Problem List for Assessment and Plan of chronic medical problems.   Follow Up Instructions:    I discussed the assessment and treatment plan with the patient. The patient was provided an opportunity to ask questions and all were answered. The patient agreed with the plan and demonstrated an understanding of the instructions.   The patient was advised to call back or  seek an in-person evaluation if the symptoms worsen or if the condition fails to improve as anticipated.  FU in 6 months.  Blood work in 6 months - will hold off on getting blood work at this time due to the covid-19 pandemic   Binnie Rail, MD

## 2019-08-27 ENCOUNTER — Other Ambulatory Visit: Payer: Self-pay | Admitting: Internal Medicine

## 2019-08-27 NOTE — Telephone Encounter (Signed)
Is this ok to fill? Looks like a different name of med for her thyroid medication.

## 2019-10-04 ENCOUNTER — Other Ambulatory Visit: Payer: Self-pay | Admitting: Internal Medicine

## 2019-11-08 NOTE — Patient Instructions (Addendum)
Tests ordered today. Your results will be released to MyChart (or called to you) after review.  If any changes need to be made, you will be notified at that same time.  All other Health Maintenance issues reviewed.   All recommended immunizations and age-appropriate screenings are up-to-date or discussed.  No immunization administered today.   Medications reviewed and updated.  Changes include :   none    Please followup in 6 months    Health Maintenance, Female Adopting a healthy lifestyle and getting preventive care are important in promoting health and wellness. Ask your health care provider about:  The right schedule for you to have regular tests and exams.  Things you can do on your own to prevent diseases and keep yourself healthy. What should I know about diet, weight, and exercise? Eat a healthy diet   Eat a diet that includes plenty of vegetables, fruits, low-fat dairy products, and lean protein.  Do not eat a lot of foods that are high in solid fats, added sugars, or sodium. Maintain a healthy weight Body mass index (BMI) is used to identify weight problems. It estimates body fat based on height and weight. Your health care provider can help determine your BMI and help you achieve or maintain a healthy weight. Get regular exercise Get regular exercise. This is one of the most important things you can do for your health. Most adults should:  Exercise for at least 150 minutes each week. The exercise should increase your heart rate and make you sweat (moderate-intensity exercise).  Do strengthening exercises at least twice a week. This is in addition to the moderate-intensity exercise.  Spend less time sitting. Even light physical activity can be beneficial. Watch cholesterol and blood lipids Have your blood tested for lipids and cholesterol at 56 years of age, then have this test every 5 years. Have your cholesterol levels checked more often if:  Your lipid or  cholesterol levels are high.  You are older than 56 years of age.  You are at high risk for heart disease. What should I know about cancer screening? Depending on your health history and family history, you may need to have cancer screening at various ages. This may include screening for:  Breast cancer.  Cervical cancer.  Colorectal cancer.  Skin cancer.  Lung cancer. What should I know about heart disease, diabetes, and high blood pressure? Blood pressure and heart disease  High blood pressure causes heart disease and increases the risk of stroke. This is more likely to develop in people who have high blood pressure readings, are of African descent, or are overweight.  Have your blood pressure checked: ? Every 3-5 years if you are 18-39 years of age. ? Every year if you are 40 years old or older. Diabetes Have regular diabetes screenings. This checks your fasting blood sugar level. Have the screening done:  Once every three years after age 40 if you are at a normal weight and have a low risk for diabetes.  More often and at a younger age if you are overweight or have a high risk for diabetes. What should I know about preventing infection? Hepatitis B If you have a higher risk for hepatitis B, you should be screened for this virus. Talk with your health care provider to find out if you are at risk for hepatitis B infection. Hepatitis C Testing is recommended for:  Everyone born from 1945 through 1965.  Anyone with known risk factors for hepatitis C.   Sexually transmitted infections (STIs)  Get screened for STIs, including gonorrhea and chlamydia, if: ? You are sexually active and are younger than 56 years of age. ? You are older than 56 years of age and your health care provider tells you that you are at risk for this type of infection. ? Your sexual activity has changed since you were last screened, and you are at increased risk for chlamydia or gonorrhea. Ask your  health care provider if you are at risk.  Ask your health care provider about whether you are at high risk for HIV. Your health care provider may recommend a prescription medicine to help prevent HIV infection. If you choose to take medicine to prevent HIV, you should first get tested for HIV. You should then be tested every 3 months for as long as you are taking the medicine. Pregnancy  If you are about to stop having your period (premenopausal) and you may become pregnant, seek counseling before you get pregnant.  Take 400 to 800 micrograms (mcg) of folic acid every day if you become pregnant.  Ask for birth control (contraception) if you want to prevent pregnancy. Osteoporosis and menopause Osteoporosis is a disease in which the bones lose minerals and strength with aging. This can result in bone fractures. If you are 65 years old or older, or if you are at risk for osteoporosis and fractures, ask your health care provider if you should:  Be screened for bone loss.  Take a calcium or vitamin D supplement to lower your risk of fractures.  Be given hormone replacement therapy (HRT) to treat symptoms of menopause. Follow these instructions at home: Lifestyle  Do not use any products that contain nicotine or tobacco, such as cigarettes, e-cigarettes, and chewing tobacco. If you need help quitting, ask your health care provider.  Do not use street drugs.  Do not share needles.  Ask your health care provider for help if you need support or information about quitting drugs. Alcohol use  Do not drink alcohol if: ? Your health care provider tells you not to drink. ? You are pregnant, may be pregnant, or are planning to become pregnant.  If you drink alcohol: ? Limit how much you use to 0-1 drink a day. ? Limit intake if you are breastfeeding.  Be aware of how much alcohol is in your drink. In the [place] one drink equals one 12 oz bottle of beer (355 mL), one 5 oz glass of wine (148  mL), or one 1 oz glass of hard liquor (44 mL). General instructions  Schedule regular health, dental, and eye exams.  Stay current with your vaccines.  Tell your health care provider if: ? You often feel depressed. ? You have ever been abused or do not feel safe at home. Summary  Adopting a healthy lifestyle and getting preventive care are important in promoting health and wellness.  Follow your health care provider's instructions about healthy diet, exercising, and getting tested or screened for diseases.  Follow your health care provider's instructions on monitoring your cholesterol and blood pressure. This information is not intended to replace advice given to you by your health care provider. Make sure you discuss any questions you have with your health care provider. Document Released: 06/28/2011 Document Revised: 12/06/2018 Document Reviewed: 12/06/2018 Elsevier Patient Education  2020 Elsevier Inc.  

## 2019-11-08 NOTE — Progress Notes (Signed)
Subjective:    Patient ID: Maria Barker, female    DOB: 01-26-1961, 58 y.o.   MRN: [id number]  HPI She is here for a physical exam.   She has noticed increased hair loss.  It has been in past several months.  She wonders about her thyroid.    Her sleep is still very poor.  She works second shift.  She takes the [place] as needed only.    Medications and allergies reviewed with patient and updated if appropriate.  Patient Active Problem List   Diagnosis Date Noted  . Nonintractable headache 05/16/2019  . Loss of taste 05/16/2019  . Chest pain 03/28/2019  . Epigastric pain 03/28/2019  . Rash on lips 01/08/2019  . Essential hypertension 01/08/2019  . Upper back pain on left side 11/27/2018  . Fatigue 03/16/2018  . Rash and nonspecific skin eruption 03/16/2018  . De Quervain's tenosynovitis, right 03/09/2018  . Chronic nonintractable headache 03/09/2018  . Pain of upper abdomen 01/17/2018  . Prediabetes 09/17/2017  . Chronic pain of left knee 04/12/2017  . B12 deficiency 08/13/2016  . S/P bilateral hip replacements 08/13/2016  . Family history of brain aneurysm 08/12/2015  . Gastroesophageal reflux disease without esophagitis 08/12/2015  . Hypothyroid 02/06/2015  . Hyperlipidemia   . Chronic insomnia 04/13/2013    Current Outpatient Medications on File Prior to Visit  Medication Sig Dispense Refill  . atorvastatin (LIPITOR) 10 MG tablet Take 1 tablet (10 mg total) by mouth daily. 90 tablet 1  . Calcium Carbonate-Vitamin D (CALCIUM-VITAMIN D3 PO) Take 1 each by mouth 2 (two) times daily.    . cyclobenzaprine (FLEXERIL) 5 MG tablet Take 1 tablet (5 mg total) by mouth at bedtime. 30 tablet 1  . EUTHYROX 50 MCG tablet TAKE 1 TABLET BY MOUTH 6 DAYS A WEEK 90 tablet 0  . olmesartan (BENICAR) 5 MG tablet Take 1 tablet by mouth once daily 90 tablet 0  . Omega-3 1000 MG CAPS Take 2,000 mg by mouth.    . vitamin B-12 (CYANOCOBALAMIN) 1000 MCG tablet Take 1,000 mcg by mouth daily.     Marland Kitchen zolpidem (AMBIEN) 5 MG tablet TAKE 1 TABLET BY MOUTH AT BEDTIME AS NEEDED FOR SLEEP 30 tablet 0   No current facility-administered medications on file prior to visit.     Past Medical History:  Diagnosis Date  . Arthritis    R THR 2011  . Back pain   . Frequent headaches   . Hyperlipidemia LDL goal < 130   . Hypothyroid 02/06/2015   Dx 01/2015: TSH 5.4  . Insomnia   . Left hip pain   . Pain of left thigh     Past Surgical History:  Procedure Laterality Date  . 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 TOTAL HIP ARTHROPLASTY Left 03/08/2016   Procedure: TOTAL HIP ARTHROPLASTY;  Surgeon: Frederik Pear, MD;  Location: [place];  Service: Orthopedics;  Laterality: Left;  . TUBAL LIGATION    . UPPER GASTROINTESTINAL ENDOSCOPY      Social History   Socioeconomic History  . Marital status: Married    Spouse name: Not on file  . Number of children: 5  . Years of education: Not on file  . Highest education level: Not on file  Occupational History  . Occupation: Glass blower/designer    Employer: [employer]  Social Needs  . Financial resource strain: Not on file  . Food insecurity    Worry: Not on file  Inability: Not on file  . Transportation needs    Medical: Not on file    Non-medical: Not on file  Tobacco Use  . Smoking status: Never Smoker  . Smokeless tobacco: Never Used  Substance and Sexual Activity  . Alcohol use: No  . Drug use: No  . Sexual activity: Not on file  Lifestyle  . Physical activity    Days per week: Not on file    Minutes per session: Not on file  . Stress: Not on file  Relationships  . Social Herbalist on phone: Not on file    Gets together: Not on file    Attends religious service: Not on file    Active member of club or organization: Not on file    Attends meetings of clubs or organizations: Not on file    Relationship status: Not on file  Other Topics Concern  . Not on file  Social History Narrative  . Not on file     Family History  Problem Relation Age of Onset  . CVA Mother   . Hypertension Mother     Review of Systems  Constitutional: Negative for chills and fever.  Eyes: Negative for visual disturbance.  Respiratory: Negative for cough, shortness of breath and wheezing.   Cardiovascular: Negative for chest pain and palpitations.  Gastrointestinal: Negative for abdominal pain, blood in stool, constipation, diarrhea and nausea.       No gerd  Genitourinary: Negative for dysuria and hematuria.  Musculoskeletal: Positive for arthralgias (left knee, hip).  Skin: Negative for color change and rash.  Neurological: Positive for headaches (occ). Negative for dizziness and light-headedness.  Psychiatric/Behavioral: Positive for sleep disturbance. Negative for dysphoric mood. The patient is not nervous/anxious.        Objective:   Vitals:   11/09/19 0856  BP: 130/72  Pulse: 62  Resp: 17  Temp: 98.2 F (36.8 C)   Filed Weights   11/09/19 0856  Weight: 126 lb (57.2 kg)   Body mass index is 25.45 kg/m.  BP Readings from Last 3 Encounters:  11/09/19 130/72  05/18/19 (!) 145/78  05/07/19 (!) 158/90    Wt Readings from Last 3 Encounters:  11/09/19 126 lb (57.2 kg)  03/28/19 126 lb 12.8 oz (57.5 kg)  01/08/19 127 lb (57.6 kg)     Physical Exam Constitutional: She appears well-developed and well-nourished. No distress.  HENT:  Head: Normocephalic and atraumatic.  Right Ear: External ear normal. Normal ear canal and TM Left Ear: External ear normal.  Normal ear canal and TM Mouth/Throat: Oropharynx is clear and moist.  Eyes: Conjunctivae and EOM are normal.  Neck: Neck supple. No tracheal deviation present. No thyromegaly present.  No carotid bruit  Cardiovascular: Normal rate, regular rhythm and normal heart sounds.   No murmur heard.  No edema. Pulmonary/Chest: Effort normal and breath sounds normal. No respiratory distress. She has no wheezes. She has no rales.  Breast:  deferred   Abdominal: Soft. She exhibits no distension. There is no tenderness.  Lymphadenopathy: She has no cervical adenopathy.  Skin: Skin is warm and dry. She is not diaphoretic.  Psychiatric: She has a normal mood and affect. Her behavior is normal.        Assessment & Plan:   Physical exam: Screening blood work    ordered Immunizations  Discussed shingrix, others up to date Colonoscopy  Up to date  Mammogram  Up to date  - 06/2019 with Dr Corinna Capra  Gyn  Up to date  Dexa  Done with Dr Corinna Capra Eye exams  Up to date  Exercise  none Weight  Normal BMI Substance abuse  none  See Problem List for Assessment and Plan of chronic medical problems.   FU in 6 months

## 2019-11-09 ENCOUNTER — Ambulatory Visit (INDEPENDENT_AMBULATORY_CARE_PROVIDER_SITE_OTHER): Payer: Commercial Managed Care - PPO | Admitting: Internal Medicine

## 2019-11-09 ENCOUNTER — Other Ambulatory Visit (INDEPENDENT_AMBULATORY_CARE_PROVIDER_SITE_OTHER): Payer: Commercial Managed Care - PPO

## 2019-11-09 ENCOUNTER — Encounter: Payer: Self-pay | Admitting: Internal Medicine

## 2019-11-09 ENCOUNTER — Other Ambulatory Visit: Payer: Self-pay

## 2019-11-09 VITALS — BP 130/72 | HR 62 | Temp 98.2°F | Resp 17 | Ht 59.0 in | Wt 126.0 lb

## 2019-11-09 DIAGNOSIS — E782 Mixed hyperlipidemia: Secondary | ICD-10-CM

## 2019-11-09 DIAGNOSIS — R7303 Prediabetes: Secondary | ICD-10-CM | POA: Diagnosis not present

## 2019-11-09 DIAGNOSIS — E038 Other specified hypothyroidism: Secondary | ICD-10-CM

## 2019-11-09 DIAGNOSIS — F5104 Psychophysiologic insomnia: Secondary | ICD-10-CM

## 2019-11-09 DIAGNOSIS — Z0000 Encounter for general adult medical examination without abnormal findings: Secondary | ICD-10-CM | POA: Diagnosis not present

## 2019-11-09 DIAGNOSIS — I10 Essential (primary) hypertension: Secondary | ICD-10-CM | POA: Diagnosis not present

## 2019-11-09 DIAGNOSIS — E538 Deficiency of other specified B group vitamins: Secondary | ICD-10-CM

## 2019-11-09 LAB — COMPREHENSIVE METABOLIC PANEL
ALT: 19 U/L (ref 0–35)
AST: 24 U/L (ref 0–37)
Albumin: 4.4 g/dL (ref 3.5–5.2)
Alkaline Phosphatase: 62 U/L (ref 39–117)
BUN: 17 mg/dL (ref 6–23)
CO2: 25 mEq/L (ref 19–32)
Calcium: 9.3 mg/dL (ref 8.4–10.5)
Chloride: 103 mEq/L (ref 96–112)
Creatinine, Ser: 0.63 mg/dL (ref 0.40–1.20)
GFR: 97.6 mL/min (ref >60.00)
Glucose, Bld: 94 mg/dL (ref 70–99)
Potassium: 3.9 mEq/L (ref 3.5–5.1)
Sodium: 136 mEq/L (ref 135–145)
Total Bilirubin: 0.4 mg/dL (ref 0.2–1.2)
Total Protein: 8.5 g/dL — ABNORMAL HIGH (ref 6.0–8.3)

## 2019-11-09 LAB — CBC WITH DIFFERENTIAL/PLATELET
Basophils Absolute: 0.1 10*3/uL (ref 0.0–0.1)
Basophils Relative: 1.3 % (ref 0.0–3.0)
Eosinophils Absolute: 0.9 10*3/uL — ABNORMAL HIGH (ref 0.0–0.7)
Eosinophils Relative: 12.7 % — ABNORMAL HIGH (ref 0.0–5.0)
HCT: 36.1 % (ref 36.0–46.0)
Hemoglobin: 11.9 g/dL — ABNORMAL LOW (ref 12.0–15.0)
Lymphocytes Relative: 27.3 % (ref 12.0–46.0)
Lymphs Abs: 1.9 10*3/uL (ref 0.7–4.0)
MCHC: 33 g/dL (ref 30.0–36.0)
MCV: 84.8 fl (ref 78.0–100.0)
Monocytes Absolute: 0.6 10*3/uL (ref 0.1–1.0)
Monocytes Relative: 8.4 % (ref 3.0–12.0)
Neutro Abs: 3.6 10*3/uL (ref 1.4–7.7)
Neutrophils Relative %: 50.3 % (ref 43.0–77.0)
Platelets: 318 10*3/uL (ref 150.0–400.0)
RBC: 4.26 Mil/uL (ref 3.87–5.11)
RDW: 13.3 % (ref 11.5–15.5)
WBC: 7.1 10*3/uL (ref 4.0–10.5)

## 2019-11-09 LAB — HEMOGLOBIN A1C: Hgb A1c MFr Bld: 6.1 % (ref 4.6–6.5)

## 2019-11-09 LAB — TSH: TSH: 1.65 u[IU]/mL (ref 0.35–4.50)

## 2019-11-09 LAB — LIPID PANEL
Cholesterol: 158 mg/dL (ref 0–200)
HDL: 65.2 mg/dL (ref >39.00)
LDL Cholesterol: 81 mg/dL (ref 0–99)
NonHDL: 92.45
Total CHOL/HDL Ratio: 2
Triglycerides: 55 mg/dL (ref 0.0–149.0)
VLDL: 11 mg/dL (ref 0.0–40.0)

## 2019-11-09 LAB — VITAMIN B12: Vitamin B-12: >1500 pg/mL — ABNORMAL HIGH (ref 211–911)

## 2019-11-09 NOTE — Assessment & Plan Note (Signed)
Check a1c Low sugar / carb diet Stressed regular exercise   

## 2019-11-09 NOTE — Assessment & Plan Note (Addendum)
Sleep varies greatly Taking ambien as needed at night

## 2019-11-09 NOTE — Assessment & Plan Note (Signed)
Check lipid panel  Continue daily statin Regular exercise and healthy diet encouraged  

## 2019-11-09 NOTE — Assessment & Plan Note (Signed)
Clinically euthyroid Check tsh  Titrate med dose if needed  

## 2019-11-09 NOTE — Assessment & Plan Note (Signed)
Taking B12 Check level 

## 2019-11-09 NOTE — Assessment & Plan Note (Signed)
BP well controlled Current regimen effective and well tolerated Continue current medications at current doses cmp  

## 2019-11-10 ENCOUNTER — Encounter: Payer: Self-pay | Admitting: Internal Medicine

## 2019-11-27 ENCOUNTER — Other Ambulatory Visit: Payer: Self-pay

## 2019-11-27 MED ORDER — LEVOTHYROXINE SODIUM 50 MCG PO TABS: ORAL_TABLET | ORAL | 1 refills | Status: DC

## 2019-12-06 ENCOUNTER — Other Ambulatory Visit: Payer: Self-pay | Admitting: Internal Medicine

## 2019-12-13 ENCOUNTER — Other Ambulatory Visit: Payer: Self-pay

## 2019-12-13 ENCOUNTER — Ambulatory Visit: Payer: Commercial Managed Care - PPO | Attending: Internal Medicine

## 2019-12-13 DIAGNOSIS — Z20822 Contact with and (suspected) exposure to covid-19: Secondary | ICD-10-CM

## 2019-12-14 ENCOUNTER — Ambulatory Visit: Payer: Self-pay

## 2019-12-14 LAB — NOVEL CORONAVIRUS, NAA: SARS-CoV-2, NAA: NOT DETECTED

## 2019-12-14 NOTE — Telephone Encounter (Signed)
Pt. Asking if she can take Theraflu with her HTN. Instructed to stay away from decongestants if possible. States she has had fever and body aches, no congestion. Instructed to take Tylenol and stay hydrated. Verbalizes understanding.

## 2019-12-14 NOTE — Telephone Encounter (Signed)
noted 

## 2019-12-15 ENCOUNTER — Ambulatory Visit
Admission: EM | Admit: 2019-12-15 | Discharge: 2019-12-15 | Disposition: A | Discharge status: Home / Self Care | Payer: Commercial Managed Care - PPO | Attending: Physician Assistant | Admitting: Physician Assistant

## 2019-12-15 ENCOUNTER — Encounter: Payer: Self-pay | Admitting: Emergency Medicine

## 2019-12-15 ENCOUNTER — Other Ambulatory Visit: Payer: Self-pay

## 2019-12-15 DIAGNOSIS — R1013 Epigastric pain: Secondary | ICD-10-CM | POA: Diagnosis not present

## 2019-12-15 HISTORY — DX: Essential (primary) hypertension: I10

## 2019-12-15 MED ORDER — OMEPRAZOLE 20 MG PO CPDR: 20.0000 mg | DELAYED_RELEASE_CAPSULE | Freq: Every day | ORAL | 0 refills | Status: DC

## 2019-12-15 MED ORDER — FLUTICASONE PROPIONATE 50 MCG/ACT NA SUSP: 2.0000 | Freq: Every day | NASAL | 0 refills | Status: DC

## 2019-12-15 NOTE — ED Triage Notes (Signed)
Pt states this past Sunday she had diarrhea and chills, so she took some cold and flu medicine which gave her heartburn. Pt states when she takes the medicine the pain travels up her throat.

## 2019-12-15 NOTE — ED Provider Notes (Signed)
[facility]    CSN: [id number] Arrival date & time: 12/15/19  [id number]      History   Chief Complaint Chief Complaint  Patient presents with  . Appointment    830  . Heartburn    HPI Maria Barker is a 58 y.o. female.   58 year old female comes in with daughter for 1 week history of epigastric pain.  HPI obtained by patient, with additional assistance from daughter for rare translation needed.  Patient states 1 week ago had 4 episodes of diarrhea.  She days after, felt chills without fever, body aches.  These completely resolved.  However, while she was symptomatic, she took cold and flu medication, and has since then had burning sensation to the epigastric region.  She denies nausea, vomiting.  Denies URI symptoms such as cough, congestion, sore throat.  Denies shortness of breath, loss of taste or smell.  Had Covid test few days ago with negative results.     Past Medical History:  Diagnosis Date  . Arthritis    R THR 2011  . Back pain   . Frequent headaches   . Hyperlipidemia LDL goal < 130   . Hypertension   . Hypothyroid 02/06/2015   Dx 01/2015: TSH 5.4  . Insomnia   . Left hip pain   . Pain of left thigh     Patient Active Problem List   Diagnosis Date Noted  . Epigastric pain 03/28/2019  . Rash on lips 01/08/2019  . Essential hypertension 01/08/2019  . Upper back pain on left side 11/27/2018  . De Quervain's tenosynovitis, right 03/09/2018  . Prediabetes 09/17/2017  . Chronic pain of left knee 04/12/2017  . B12 deficiency 08/13/2016  . S/P bilateral hip replacements 08/13/2016  . Family history of brain aneurysm 08/12/2015  . Gastroesophageal reflux disease without esophagitis 08/12/2015  . Hypothyroid 02/06/2015  . Hyperlipidemia   . Chronic insomnia 04/13/2013    Past Surgical History:  Procedure Laterality Date  . 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 TOTAL HIP ARTHROPLASTY Left 03/08/2016   Procedure: TOTAL HIP ARTHROPLASTY;   Surgeon: Frederik Pear, MD;  Location: [facility];  Service: Orthopedics;  Laterality: Left;  . TUBAL LIGATION    . UPPER GASTROINTESTINAL ENDOSCOPY      OB History   No obstetric history on file.      Home Medications    Prior to Admission medications   Medication Sig Start Date End Date Taking? Authorizing Provider  atorvastatin (LIPITOR) 10 MG tablet Take 1 tablet (10 mg total) by mouth daily. 07/11/19   Binnie Rail, MD  Calcium Carbonate-Vitamin D (CALCIUM-VITAMIN D3 PO) Take 1 each by mouth 2 (two) times daily.    [provider]  fluticasone (FLONASE) 50 MCG/ACT nasal spray Place 2 sprays into both nostrils daily. 12/15/19   Ok Edwards, PA-C  levothyroxine (EUTHYROX) 50 MCG tablet TAKE 1 TABLET BY MOUTH 6 DAYS A WEEK 11/27/19   Burns, Claudina Lick, MD  olmesartan (BENICAR) 5 MG tablet Take 1 tablet by mouth once daily 10/04/19   Binnie Rail, MD  Omega-3 1000 MG CAPS Take 2,000 mg by mouth.    [provider]  omeprazole (PRILOSEC) 20 MG capsule Take 1 capsule (20 mg total) by mouth daily. 12/15/19   Tasia Catchings, Clarence Dunsmore V, PA-C  vitamin B-12 (CYANOCOBALAMIN) 1000 MCG tablet Take 1,000 mcg by mouth daily.    [provider]  zolpidem (AMBIEN) 5 MG tablet TAKE 1  TABLET BY MOUTH AT BEDTIME AS NEEDED FOR SLEEP 12/06/19   Binnie Rail, MD    Family History Family History  Problem Relation Age of Onset  . CVA Mother   . Hypertension Mother     Social History Social History   Tobacco Use  . Smoking status: Never Smoker  . Smokeless tobacco: Never Used  Substance Use Topics  . Alcohol use: No  . Drug use: No     Allergies   Ibuprofen and Asa [aspirin]   Review of Systems Review of Systems  Reason unable to perform ROS: See HPI as above.     Physical Exam Triage Vital Signs ED Triage Vitals  Enc Vitals Group     BP 12/15/19 0847 (!) 144/77     Pulse Rate 12/15/19 0847 63     Resp 12/15/19 0847 18     Temp 12/15/19 0847 97.9 F (36.6 C)     Temp src  --      SpO2 12/15/19 0847 95 %     Weight --      Height --      Head Circumference --      Peak Flow --      Pain Score 12/15/19 0849 3     Pain Loc --      Pain Edu? --      Excl. in [place]? --    No data found.  Updated Vital Signs BP (!) 144/77   Pulse 63   Temp 97.9 F (36.6 C)   Resp 18   SpO2 95%   Physical Exam Constitutional:      General: She is not in acute distress.    Appearance: She is well-developed. She is not ill-appearing, toxic-appearing or diaphoretic.  HENT:     Head: Normocephalic and atraumatic.  Eyes:     Conjunctiva/sclera: Conjunctivae normal.     Pupils: Pupils are equal, round, and reactive to light.  Cardiovascular:     Rate and Rhythm: Normal rate and regular rhythm.     Heart sounds: Normal heart sounds. No murmur. No friction rub. No gallop.   Pulmonary:     Effort: Pulmonary effort is normal.     Breath sounds: Normal breath sounds. No wheezing or rales.  Abdominal:     General: Bowel sounds are normal.     Palpations: Abdomen is soft.     Tenderness: There is abdominal tenderness in the epigastric area. There is no right CVA tenderness, left CVA tenderness, guarding or rebound.  Skin:    General: Skin is warm and dry.  Neurological:     Mental Status: She is alert and oriented to person, place, and time.  Psychiatric:        Behavior: Behavior normal.        Judgment: Judgment normal.      UC Treatments / Results  Labs (all labs ordered are listed, but only abnormal results are displayed) Labs Reviewed - No data to display  EKG   Radiology No results found.  Procedures Procedures (including critical care time)  Medications Ordered in UC Medications - No data to display  Initial Impression / Assessment and Plan / UC Course  I have reviewed the triage vital signs and the nursing notes.  Pertinent labs & imaging results that were available during my care of the patient were reviewed by me and considered in my medical  decision making (see chart for details).    No alarming signs on exam.  Will start patient on omeprazole, Flonase for possible acid reflux versus postnasal drip causing symptoms.  Push fluids.  Return precautions given.  Patient expresses understanding and agrees to plan.  Final Clinical Impressions(s) / UC Diagnoses   Final diagnoses:  Abdominal pain, epigastric    ED Prescriptions    Medication Sig Dispense Auth. Provider   omeprazole (PRILOSEC) 20 MG capsule Take 1 capsule (20 mg total) by mouth daily. 15 capsule Brittaney Beaulieu V, PA-C   fluticasone (FLONASE) 50 MCG/ACT nasal spray Place 2 sprays into both nostrils daily. 1 g Ok Edwards, PA-C     PDMP not reviewed this encounter.   Ok Edwards, PA-C 12/15/19 1418

## 2019-12-15 NOTE — Discharge Instructions (Signed)
Start omeprazole for acid reflux. Add on flonase for possible post nasal drainage causing symptoms. Keep hydrated, urine should be clear to pale yellow in color. Follow up with PCP for further evaluation if symptoms not improving. If developing nausea/vomiting, worsening abdominal pain, follow up for reevaluation needed.

## 2020-01-02 ENCOUNTER — Ambulatory Visit: Payer: Commercial Managed Care - PPO | Admitting: Internal Medicine

## 2020-01-03 ENCOUNTER — Ambulatory Visit: Payer: Self-pay

## 2020-01-03 ENCOUNTER — Telehealth: Payer: Self-pay | Admitting: Orthopaedic Surgery

## 2020-01-03 ENCOUNTER — Ambulatory Visit (INDEPENDENT_AMBULATORY_CARE_PROVIDER_SITE_OTHER): Payer: Commercial Managed Care - PPO | Admitting: Orthopaedic Surgery

## 2020-01-03 ENCOUNTER — Other Ambulatory Visit: Payer: Self-pay | Admitting: Internal Medicine

## 2020-01-03 ENCOUNTER — Encounter: Payer: Self-pay | Admitting: Orthopaedic Surgery

## 2020-01-03 ENCOUNTER — Other Ambulatory Visit: Payer: Self-pay

## 2020-01-03 DIAGNOSIS — M1712 Unilateral primary osteoarthritis, left knee: Secondary | ICD-10-CM

## 2020-01-03 DIAGNOSIS — Z96642 Presence of left artificial hip joint: Secondary | ICD-10-CM | POA: Diagnosis not present

## 2020-01-03 MED ORDER — BUPIVACAINE HCL 0.5 % IJ SOLN
2.0000 mL | INTRAMUSCULAR | Status: AC | PRN
  Administered 2020-01-03: 2 mL via INTRA_ARTICULAR

## 2020-01-03 MED ORDER — PENNSAID 2 % EX SOLN: 2.0000 g | Freq: Two times a day (BID) | CUTANEOUS | 3 refills | Status: DC | PRN

## 2020-01-03 MED ORDER — LIDOCAINE HCL 1 % IJ SOLN
2.0000 mL | INTRAMUSCULAR | Status: AC | PRN
  Administered 2020-01-03: 2 mL

## 2020-01-03 MED ORDER — METHYLPREDNISOLONE ACETATE 40 MG/ML IJ SUSP
40.0000 mg | INTRAMUSCULAR | Status: AC | PRN
  Administered 2020-01-03: 40 mg via INTRA_ARTICULAR

## 2020-01-03 NOTE — Progress Notes (Signed)
Office Visit Note   Patient: Maria Barker           Date of Birth: [birth year]           MRN: [id number] Visit Date: 01/03/2020              Requested by: Binnie Rail, MD [place],  [place] [id number] PCP: Binnie Rail, MD   Assessment & Plan: Visit Diagnoses:  1. Primary osteoarthritis of left knee   2. 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  For the left knee prescription for Pennsaid was sent in.  Cortisone injection repeated today.  I gave her a brochure on viscosupplementation injections.  For the left hip she is currently doing physical therapy for this at [facility] and she has a follow-up appointment with Dr. Mayer Camel next week.  Questions encouraged and answered.  Follow-up as needed.  Follow-Up Instructions: Return if symptoms worsen or fail to improve.   Orders:  Orders Placed This Encounter  Procedures  . Large Joint Inj  . XR KNEE 3 VIEW LEFT  . XR HIP UNILAT W OR W/O PELVIS 2-3 VIEWS LEFT   Meds ordered this encounter  Medications  . Diclofenac Sodium (PENNSAID) 2 % SOLN    Sig: Apply 2 g topically 2 (two) times daily as needed (to affected area).    Dispense:  112 g    Refill:  3      Procedures: Large Joint Inj: L knee on 01/03/2020 11:17 AM Details: 22 G needle Medications: 2 mL bupivacaine 0.5 %; 2 mL lidocaine 1 %; 40 mg methylPREDNISolone acetate 40 MG/ML Outcome: tolerated well, no immediate complications Patient was prepped and draped in the usual sterile fashion.       Clinical Data: No additional findings.   Subjective: Chief Complaint  Patient presents with  . Left Knee - Pain, Follow-up    Patient is a very pleasant 46 year [place] female who presents with her son to help with the language barrier.  She was previously seen in our office in 2018 for left knee pain.  I provided her with a cortisone  injection which helped for about a year.  She is interested in getting another injection.  She is also complaining of some lateral left hip pain.  She is status post left hip replacement some years ago by Dr. Mayer Camel.  She denies any radicular symptoms.  She denies any swelling in her knee.  Denies any mechanical symptoms.  Meloxicam does not seem to provide her much relief.   Review of Systems  Constitutional: Negative.   HENT: Negative.   Eyes: Negative.   Respiratory: Negative.   Cardiovascular: Negative.   Endocrine: Negative.   Musculoskeletal: Negative.   Neurological: Negative.   Hematological: Negative.   Psychiatric/Behavioral: Negative.   All other systems reviewed and are negative.    Objective: Vital Signs: There were no vitals taken for this visit.  Physical Exam Vitals and nursing note reviewed.  Constitutional:      Appearance: She is well-developed.  Pulmonary:     Effort: Pulmonary effort is normal.  Skin:    General: Skin is warm.     Capillary Refill: Capillary refill takes less than 2 seconds.  Neurological:     Mental Status: She is alert and oriented to person, place, and time.  Psychiatric:  Behavior: Behavior normal.        Thought Content: Thought content normal.        Judgment: Judgment normal.     Ortho Exam Left hip exam-shows point tenderness to the lateral aspect of the greater trochanter and posterior lateral region near the short external rotators.  Negative logroll.  Negative Stinchfield sign.  Negative sciatic tension signs.  No pain with internal/external rotation.  Left knee exam shows no joint effusion.  No tenderness to palpation.  Positive cruciates are stable.  Normal range of motion.  Specialty Comments:  No specialty comments available.  Imaging: XR HIP UNILAT W OR W/O PELVIS 2-3 VIEWS LEFT  Result Date: 01/03/2020 Stable total hip replacement without complication  XR KNEE 3 VIEW LEFT  Result Date: 01/03/2020 Mild  osteoarthritis.  No degenerative changes    PMFS History: Patient Active Problem List   Diagnosis Date Noted  . Status post left hip replacement 01/03/2020  . Epigastric pain 03/28/2019  . Rash on lips 01/08/2019  . Essential hypertension 01/08/2019  . Upper back pain on left side 11/27/2018  . De Quervain's tenosynovitis, right 03/09/2018  . Prediabetes 09/17/2017  . Chronic pain of left knee 04/12/2017  . B12 deficiency 08/13/2016  . S/P bilateral hip replacements 08/13/2016  . Family history of brain aneurysm 08/12/2015  . Gastroesophageal reflux disease without esophagitis 08/12/2015  . Hypothyroid 02/06/2015  . Hyperlipidemia   . Chronic insomnia 04/13/2013   Past Medical History:  Diagnosis Date  . Arthritis    R THR 2011  . Back pain   . Frequent headaches   . Hyperlipidemia LDL goal < 130   . Hypertension   . Hypothyroid 02/06/2015   Dx 01/2015: TSH 5.4  . Insomnia   . Left hip pain   . Pain of left thigh     Family History  Problem Relation Age of Onset  . CVA Mother   . Hypertension Mother     Past Surgical History:  Procedure Laterality Date  . 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 TOTAL HIP ARTHROPLASTY Left 03/08/2016   Procedure: TOTAL HIP ARTHROPLASTY;  Surgeon: Frederik Pear, MD;  Location: [facility];  Service: Orthopedics;  Laterality: Left;  . TUBAL LIGATION    . UPPER GASTROINTESTINAL ENDOSCOPY     Social History   Occupational History  . Occupation: IT sales professional: [employer]  Tobacco Use  . Smoking status: Never Smoker  . Smokeless tobacco: Never Used  Substance and Sexual Activity  . Alcohol use: No  . Drug use: No  . Sexual activity: Not on file

## 2020-01-03 NOTE — Telephone Encounter (Signed)
Maria Barker from [facility] [facility] [facility] [employer]. She says patient has an allergy to Ibprophen and Asprin. Says she will not be able to use the proscribe medication. Her call back number is [phone]

## 2020-01-10 ENCOUNTER — Telehealth: Payer: Self-pay | Admitting: Orthopaedic Surgery

## 2020-01-10 NOTE — Telephone Encounter (Signed)
Received call from [facility] with [facility] she advised patient has an allergy to Ibuprofen and Aspirin. She advised patient will not be able to to use the prescribed medication. The number to contact [facility] is [phone]

## 2020-01-11 NOTE — Telephone Encounter (Signed)
Ok thanks 

## 2020-03-07 ENCOUNTER — Encounter: Payer: Self-pay | Admitting: Orthopaedic Surgery

## 2020-03-07 ENCOUNTER — Other Ambulatory Visit: Payer: Self-pay

## 2020-03-07 ENCOUNTER — Telehealth: Payer: Self-pay

## 2020-03-07 ENCOUNTER — Ambulatory Visit: Payer: Commercial Managed Care - PPO | Admitting: Orthopaedic Surgery

## 2020-03-07 VITALS — Ht 59.0 in | Wt 126.0 lb

## 2020-03-07 DIAGNOSIS — M1712 Unilateral primary osteoarthritis, left knee: Secondary | ICD-10-CM | POA: Insufficient documentation

## 2020-03-07 NOTE — Telephone Encounter (Signed)
Submitted for VOB for Durolane-Left knee  

## 2020-03-07 NOTE — Telephone Encounter (Signed)
Please submit for Left knee Gel injection Dr. Erlinda Hong

## 2020-03-07 NOTE — Progress Notes (Signed)
Office Visit Note   Patient: Maria Barker           Date of Birth: 12-Jun-1961           MRN: [id number] Visit Date: 03/07/2020              Requested by: Binnie Rail, MD [place],  [place] [address] PCP: Binnie Rail, MD   Assessment & Plan: Visit Diagnoses:  1. Primary osteoarthritis of left knee     Plan: Impression is left knee osteoarthritis with temporary relief from cortisone injection.  Patient would like to try viscosupplementation at this point.  She will also obtain Voltaren gel over-the-counter.  We will have her come back once the Synvisc injection has been approved.  Follow-Up Instructions: Return if symptoms worsen or fail to improve.   Orders:  No orders of the defined types were placed in this encounter.  No orders of the defined types were placed in this encounter.     Procedures: No procedures performed   Clinical Data: No additional findings.   Subjective: Chief Complaint  Patient presents with  . Left Knee - Follow-up    Patient returns today for continued left knee pain.  Cortisone injection in on January 7 gave her 1 week of relief.  The pain is now back.  She did take a couple doses of indomethacin which did help but it caused her GI reflux.   Review of Systems   Objective: Vital Signs: Ht 4\' 11"  (1.499 m)   Wt 126 lb (57.2 kg)   BMI 25.45 kg/m   Physical Exam  Ortho Exam Left knee exam is unchanged. Specialty Comments:  No specialty comments available.  Imaging: No results found.   PMFS History: Patient Active Problem List   Diagnosis Date Noted  . Primary osteoarthritis of left knee 03/07/2020  . Status post left hip replacement 01/03/2020  . Epigastric pain 03/28/2019  . Rash on lips 01/08/2019  . Essential hypertension 01/08/2019  . Upper back pain on left side 11/27/2018  . De Quervain's tenosynovitis, right 03/09/2018  . Prediabetes 09/17/2017  . Chronic pain of left knee 04/12/2017  . B12  deficiency 08/13/2016  . S/P bilateral hip replacements 08/13/2016  . Family history of brain aneurysm 08/12/2015  . Gastroesophageal reflux disease without esophagitis 08/12/2015  . Hypothyroid 02/06/2015  . Hyperlipidemia   . Chronic insomnia 04/13/2013   Past Medical History:  Diagnosis Date  . Arthritis    R THR 2011  . Back pain   . Frequent headaches   . Hyperlipidemia LDL goal < 130   . Hypertension   . Hypothyroid 02/06/2015   Dx 01/2015: TSH 5.4  . Insomnia   . Left hip pain   . Pain of left thigh     Family History  Problem Relation Age of Onset  . CVA Mother   . Hypertension Mother     Past Surgical History:  Procedure Laterality Date  . 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 TOTAL HIP ARTHROPLASTY Left 03/08/2016   Procedure: TOTAL HIP ARTHROPLASTY;  Surgeon: Frederik Pear, MD;  Location: [facility];  Service: Orthopedics;  Laterality: Left;  . TUBAL LIGATION    . UPPER GASTROINTESTINAL ENDOSCOPY     Social History   Occupational History  . Occupation: IT sales professional: [employer]  Tobacco Use  . Smoking status: Never Smoker  . Smokeless tobacco: Never Used  Substance and Sexual  Activity  . Alcohol use: No  . Drug use: No  . Sexual activity: Not on file

## 2020-03-10 ENCOUNTER — Telehealth: Payer: Self-pay

## 2020-03-10 NOTE — Telephone Encounter (Signed)
Approved for Durolane-Left knee Dr. Frederik Pear and Bill $80 copay 30% OOP No prior auth required

## 2020-03-10 NOTE — Telephone Encounter (Signed)
New start ok to schedule at next available

## 2020-03-10 NOTE — Telephone Encounter (Signed)
Pt informed and stated understanding to approval and copay. Pt scheduled for thursday

## 2020-03-13 ENCOUNTER — Ambulatory Visit: Payer: Commercial Managed Care - PPO | Admitting: Orthopaedic Surgery

## 2020-03-13 ENCOUNTER — Encounter: Payer: Self-pay | Admitting: Orthopaedic Surgery

## 2020-03-13 ENCOUNTER — Other Ambulatory Visit: Payer: Self-pay

## 2020-03-13 VITALS — Ht 59.0 in | Wt 126.0 lb

## 2020-03-13 DIAGNOSIS — M1712 Unilateral primary osteoarthritis, left knee: Secondary | ICD-10-CM | POA: Diagnosis not present

## 2020-03-13 MED ORDER — SODIUM HYALURONATE 60 MG/3ML IX PRSY
60.0000 mg | PREFILLED_SYRINGE | INTRA_ARTICULAR | Status: AC | PRN
  Administered 2020-03-13: 60 mg via INTRA_ARTICULAR

## 2020-03-13 MED ORDER — LIDOCAINE HCL 1 % IJ SOLN
2.0000 mL | INTRAMUSCULAR | Status: AC | PRN
  Administered 2020-03-13: 10:00:00 2 mL

## 2020-03-13 MED ORDER — BUPIVACAINE HCL 0.25 % IJ SOLN
2.0000 mL | INTRAMUSCULAR | Status: AC | PRN
  Administered 2020-03-13: 2 mL via INTRA_ARTICULAR

## 2020-03-13 NOTE — Progress Notes (Signed)
   Procedure Note  Patient: Maria Barker             Date of Birth: 1961/04/21           MRN: [id number]             Visit Date: 03/13/2020  Procedures: Visit Diagnoses:  1. Unilateral primary osteoarthritis, left knee     Large Joint Inj: L knee on 03/13/2020 10:14 AM Indications: pain Details: 22 G needle, anterolateral approach Medications: 2 mL lidocaine 1 %; 2 mL bupivacaine 0.25 %; 60 mg Sodium Hyaluronate 60 MG/3ML

## 2020-04-05 ENCOUNTER — Other Ambulatory Visit: Payer: Self-pay | Admitting: Internal Medicine

## 2020-04-11 ENCOUNTER — Other Ambulatory Visit: Payer: Self-pay | Admitting: Internal Medicine

## 2020-04-30 ENCOUNTER — Other Ambulatory Visit: Payer: Self-pay | Admitting: Internal Medicine

## 2020-05-07 NOTE — Patient Instructions (Addendum)
  Blood work was ordered.     Medications reviewed and updated.  Changes include :   none  Your prescription(s) have been submitted to your pharmacy. Please take as directed and contact our office if you believe you are having problem(s) with the medication(s).   Please followup in 6 months   

## 2020-05-07 NOTE — Progress Notes (Signed)
Subjective:    Patient ID: Maria Barker, female    DOB: Sep 29, 1961, 59 y.o.   MRN: [id number]  HPI The patient is here for follow up of their chronic medical problems, including hypertension, hyperlipidemia, prediabetes, hypothyroidism, insomnia, GERD  She is taking all of her medications as prescribed.   She is exercising irregularly.   He is limiting her sugars.  Left hip pain - it started hurting last fall.  The pain would radiate down her leg.  She saw someone and she did PT.  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   Left knee pain:  She has seen Dr Erlinda Hong. He diagnosed with her with osteoarthritis. He prescribed voltaren gel and gave a steroid injection.  She did PT.  He gave her info about viscosupplementation injections. She does not have any pain with walking.  She uses her leg repeatedly at work and that is when it hurts.   Medications and allergies reviewed with patient and updated if appropriate.  Patient Active Problem List   Diagnosis Date Noted  . Left hip pain 05/08/2020  . Status post left hip replacement 01/03/2020  . Epigastric pain 03/28/2019  . Rash on lips 01/08/2019  . Essential hypertension 01/08/2019  . De Quervain's tenosynovitis, right 03/09/2018  . Prediabetes 09/17/2017  . Osteoarthritis of left knee 04/12/2017  . B12 deficiency 08/13/2016  . S/P bilateral hip replacements 08/13/2016  . Family history of brain aneurysm 08/12/2015  . Gastroesophageal reflux disease without esophagitis 08/12/2015  . Hypothyroid 02/06/2015  . Hyperlipidemia   . Chronic insomnia 04/13/2013    Current Outpatient Medications on File Prior to Visit  Medication Sig Dispense Refill  . atorvastatin (LIPITOR) 10 MG tablet Take 1 tablet by mouth once daily 90 tablet 0  . Calcium Carbonate-Vitamin D (CALCIUM-VITAMIN D3 PO) Take 1 each by mouth 2 (two) times daily.    Marland Kitchen levothyroxine (EUTHYROX) 50 MCG tablet TAKE 1 TABLET BY MOUTH 6 DAYS A WEEK 90 tablet 1  . olmesartan  (BENICAR) 5 MG tablet Take 1 tablet by mouth once daily 90 tablet 0  . Omega-3 1000 MG CAPS Take 2,000 mg by mouth.    Marland Kitchen omeprazole (PRILOSEC) 20 MG capsule TAKE 1 CAPSULE BY MOUTH ONCE DAILY 30  MINUTES  PRIOR  TO  A  MEAL 30 capsule 0  . vitamin B-12 (CYANOCOBALAMIN) 1000 MCG tablet Take 1,000 mcg by mouth daily.    Marland Kitchen zolpidem (AMBIEN) 5 MG tablet TAKE 1 TABLET BY MOUTH AT BEDTIME AS NEEDED FOR SLEEP 30 tablet 0   No current facility-administered medications on file prior to visit.    Past Medical History:  Diagnosis Date  . Arthritis    R THR 2011  . Back pain   . Frequent headaches   . Hyperlipidemia LDL goal < 130   . Hypertension   . Hypothyroid 02/06/2015   Dx 01/2015: TSH 5.4  . Insomnia   . Left hip pain   . Pain of left thigh     Past Surgical History:  Procedure Laterality Date  . 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 TOTAL HIP ARTHROPLASTY Left 03/08/2016   Procedure: TOTAL HIP ARTHROPLASTY;  Surgeon: Frederik Pear, MD;  Location: [place];  Service: Orthopedics;  Laterality: Left;  . TUBAL LIGATION    . UPPER GASTROINTESTINAL ENDOSCOPY      Social History   Socioeconomic History  . Marital status: Married    Spouse name: Not on file  .  Number of children: 5  . Years of education: Not on file  . Highest education level: Not on file  Occupational History  . Occupation: IT sales professional: [employer]  Tobacco Use  . Smoking status: Never Smoker  . Smokeless tobacco: Never Used  Substance and Sexual Activity  . Alcohol use: No  . Drug use: No  . Sexual activity: Not on file  Other Topics Concern  . Not on file  Social History Narrative  . Not on file   Social Determinants of Health   Financial Resource Strain:   . Difficulty of Paying Living Expenses:   Food Insecurity:   . Worried About Charity fundraiser in the Last Year:   . Arboriculturist in the Last Year:   Transportation Needs:   . Film/video editor (Medical):   Marland Kitchen  Lack of Transportation (Non-Medical):   Physical Activity:   . Days of Exercise per Week:   . Minutes of Exercise per Session:   Stress:   . Feeling of Stress :   Social Connections:   . Frequency of Communication with Friends and Family:   . Frequency of Social Gatherings with Friends and Family:   . Attends Religious Services:   . Active Member of Clubs or Organizations:   . Attends Archivist Meetings:   Marland Kitchen Marital Status:     Family History  Problem Relation Age of Onset  . CVA Mother   . Hypertension Mother     Review of Systems  Constitutional: Negative for chills, fatigue and fever.  Respiratory: Negative for cough, shortness of breath and wheezing.   Cardiovascular: Negative for chest pain, palpitations and leg swelling.  Musculoskeletal: Positive for arthralgias.  Neurological: Negative for light-headedness and headaches.       Objective:   Vitals:   05/08/20 0949  BP: 140/82  Pulse: (!) 57  Resp: 16  Temp: 97.9 F (36.6 C)  SpO2: 99%   BP Readings from Last 3 Encounters:  05/08/20 140/82  12/15/19 (!) 144/77  11/09/19 130/72   Wt Readings from Last 3 Encounters:  05/08/20 130 lb (59 kg)  03/13/20 126 lb (57.2 kg)  03/07/20 126 lb (57.2 kg)   Body mass index is 26.26 kg/m.   Physical Exam    Constitutional: Appears well-developed and well-nourished. No distress.  HENT:  Head: Normocephalic and atraumatic.  Neck: Neck supple. No tracheal deviation present. No thyromegaly present.  No cervical lymphadenopathy Cardiovascular: Normal rate, regular rhythm and normal heart sounds.   No murmur heard. No carotid bruit .  No edema Pulmonary/Chest: Effort normal and breath sounds normal. No respiratory distress. No has no wheezes. No rales.  Skin: Skin is warm and dry. Not diaphoretic.  Psychiatric: Normal mood and affect. Behavior is normal.      Assessment & Plan:    See Problem List for Assessment and Plan of chronic medical problems.     This visit occurred during the SARS-CoV-2 public health emergency.  Safety protocols were in place, including screening questions prior to the visit, additional usage of staff PPE, and extensive cleaning of exam room while observing appropriate contact time as indicated for disinfecting solutions.

## 2020-05-08 ENCOUNTER — Encounter: Payer: Self-pay | Admitting: Internal Medicine

## 2020-05-08 ENCOUNTER — Other Ambulatory Visit: Payer: Self-pay

## 2020-05-08 ENCOUNTER — Ambulatory Visit: Payer: Commercial Managed Care - PPO | Admitting: Internal Medicine

## 2020-05-08 VITALS — BP 140/82 | HR 57 | Temp 97.9°F | Resp 16 | Ht 59.0 in | Wt 130.0 lb

## 2020-05-08 DIAGNOSIS — K219 Gastro-esophageal reflux disease without esophagitis: Secondary | ICD-10-CM

## 2020-05-08 DIAGNOSIS — R7303 Prediabetes: Secondary | ICD-10-CM | POA: Diagnosis not present

## 2020-05-08 DIAGNOSIS — M25552 Pain in left hip: Secondary | ICD-10-CM | POA: Insufficient documentation

## 2020-05-08 DIAGNOSIS — M1712 Unilateral primary osteoarthritis, left knee: Secondary | ICD-10-CM

## 2020-05-08 DIAGNOSIS — E782 Mixed hyperlipidemia: Secondary | ICD-10-CM | POA: Diagnosis not present

## 2020-05-08 DIAGNOSIS — I10 Essential (primary) hypertension: Secondary | ICD-10-CM

## 2020-05-08 DIAGNOSIS — F5104 Psychophysiologic insomnia: Secondary | ICD-10-CM

## 2020-05-08 DIAGNOSIS — E038 Other specified hypothyroidism: Secondary | ICD-10-CM

## 2020-05-08 LAB — LIPID PANEL
Cholesterol: 168 mg/dL (ref 0–200)
HDL: 64.8 mg/dL (ref >39.00)
LDL Cholesterol: 91 mg/dL (ref 0–99)
NonHDL: 103.12
Total CHOL/HDL Ratio: 3
Triglycerides: 61 mg/dL (ref 0.0–149.0)
VLDL: 12.2 mg/dL (ref 0.0–40.0)

## 2020-05-08 LAB — COMPREHENSIVE METABOLIC PANEL
ALT: 24 U/L (ref 0–35)
AST: 29 U/L (ref 0–37)
Albumin: 4.2 g/dL (ref 3.5–5.2)
Alkaline Phosphatase: 65 U/L (ref 39–117)
BUN: 18 mg/dL (ref 6–23)
CO2: 25 mEq/L (ref 19–32)
Calcium: 9.1 mg/dL (ref 8.4–10.5)
Chloride: 103 mEq/L (ref 96–112)
Creatinine, Ser: 0.61 mg/dL (ref 0.40–1.20)
GFR: 101.13 mL/min (ref >60.00)
Glucose, Bld: 99 mg/dL (ref 70–99)
Potassium: 3.9 mEq/L (ref 3.5–5.1)
Sodium: 134 mEq/L — ABNORMAL LOW (ref 135–145)
Total Bilirubin: 0.3 mg/dL (ref 0.2–1.2)
Total Protein: 8.2 g/dL (ref 6.0–8.3)

## 2020-05-08 LAB — TSH: TSH: 0.6 u[IU]/mL (ref 0.35–4.50)

## 2020-05-08 LAB — HEMOGLOBIN A1C: Hgb A1c MFr Bld: 6.1 % (ref 4.6–6.5)

## 2020-05-08 NOTE — Assessment & Plan Note (Signed)
Chronic GERD controlled Continue daily medication  

## 2020-05-08 NOTE — Assessment & Plan Note (Signed)
S/p THR Has intermittent anterior left hip radiating down to knee Saw Dr Erlinda Hong - abductor tendinosis Currently no symptoms as long as she does not use it too much at work it does not hurt

## 2020-05-08 NOTE — Assessment & Plan Note (Signed)
Chronic  Clinically euthyroid Check tsh  Titrate med dose if needed  

## 2020-05-08 NOTE — Assessment & Plan Note (Signed)
Chronic Check lipid panel  Continue daily statin Regular exercise and healthy diet encouraged  

## 2020-05-08 NOTE — Assessment & Plan Note (Signed)
BP Readings from Last 3 Encounters:  05/08/20 140/82  12/15/19 (!) 144/77  11/09/19 130/72   Chronic BP well controlled Current regimen effective and well tolerated Continue current medications at current doses cmp

## 2020-05-08 NOTE — Assessment & Plan Note (Signed)
Chronic Sleep varies  - some good nights, some bad Taking ambien 5 mg some nights- it helps Continue current medication

## 2020-05-08 NOTE — Assessment & Plan Note (Signed)
Dr Erlinda Hong Has had steroid injection Was advised to use voltaren viscosupplementation injection may be next step

## 2020-05-11 ENCOUNTER — Encounter: Payer: Self-pay | Admitting: Internal Medicine

## 2020-06-30 ENCOUNTER — Other Ambulatory Visit: Payer: Self-pay | Admitting: Internal Medicine

## 2020-07-14 ENCOUNTER — Other Ambulatory Visit: Payer: Self-pay | Admitting: Internal Medicine

## 2020-08-27 ENCOUNTER — Other Ambulatory Visit: Payer: Self-pay | Admitting: Internal Medicine

## 2020-09-10 LAB — HM DEXA SCAN

## 2020-09-10 LAB — HM MAMMOGRAPHY

## 2020-09-15 ENCOUNTER — Other Ambulatory Visit: Payer: Self-pay | Admitting: Internal Medicine

## 2020-09-30 ENCOUNTER — Telehealth: Payer: Self-pay | Admitting: Internal Medicine

## 2020-09-30 ENCOUNTER — Other Ambulatory Visit: Payer: Self-pay

## 2020-09-30 MED ORDER — LEVOTHYROXINE SODIUM 50 MCG PO TABS: ORAL_TABLET | ORAL | 0 refills | Status: DC

## 2020-09-30 NOTE — Telephone Encounter (Signed)
    Patient requesting refill for levothyroxine (EUTHYROX) ,  [facility] ([facility] [place] [facility] [place]

## 2020-09-30 NOTE — Telephone Encounter (Signed)
Sent in today 

## 2020-10-08 ENCOUNTER — Other Ambulatory Visit: Payer: Self-pay | Admitting: Internal Medicine

## 2020-10-08 NOTE — Telephone Encounter (Signed)
Check [place] registry last filled 12/06/2019.Marland KitchenJohny Barker

## 2020-11-09 NOTE — Patient Instructions (Addendum)
Blood work was ordered.    All other Health Maintenance issues reviewed.   All recommended immunizations and age-appropriate screenings are up-to-date or discussed.  Flu immunization administered today.   Medications reviewed and updated.  Changes include :     Your prescription(s) have been submitted to your pharmacy. Please take as directed and contact our office if you believe you are having problem(s) with the medication(s).   Please followup in 6 months    Health Maintenance, Female Adopting a healthy lifestyle and getting preventive care are important in promoting health and wellness. Ask your health care provider about:  The right schedule for you to have regular tests and exams.  Things you can do on your own to prevent diseases and keep yourself healthy. What should I know about diet, weight, and exercise? Eat a healthy diet   Eat a diet that includes plenty of vegetables, fruits, low-fat dairy products, and lean protein.  Do not eat a lot of foods that are high in solid fats, added sugars, or sodium. Maintain a healthy weight Body mass index (BMI) is used to identify weight problems. It estimates body fat based on height and weight. Your health care provider can help determine your BMI and help you achieve or maintain a healthy weight. Get regular exercise Get regular exercise. This is one of the most important things you can do for your health. Most adults should:  Exercise for at least 150 minutes each week. The exercise should increase your heart rate and make you sweat (moderate-intensity exercise).  Do strengthening exercises at least twice a week. This is in addition to the moderate-intensity exercise.  Spend less time sitting. Even light physical activity can be beneficial. Watch cholesterol and blood lipids Have your blood tested for lipids and cholesterol at 57 years of age, then have this test every 5 years. Have your cholesterol levels checked more often  if:  Your lipid or cholesterol levels are high.  You are older than 57 years of age.  You are at high risk for heart disease. What should I know about cancer screening? Depending on your health history and family history, you may need to have cancer screening at various ages. This may include screening for:  Breast cancer.  Cervical cancer.  Colorectal cancer.  Skin cancer.  Lung cancer. What should I know about heart disease, diabetes, and high blood pressure? Blood pressure and heart disease  High blood pressure causes heart disease and increases the risk of stroke. This is more likely to develop in people who have high blood pressure readings, are of African descent, or are overweight.  Have your blood pressure checked: ? Every 3-5 years if you are 33-37 years of age. ? Every year if you are 74 years old or older. Diabetes Have regular diabetes screenings. This checks your fasting blood sugar level. Have the screening done:  Once every three years after age 14 if you are at a normal weight and have a low risk for diabetes.  More often and at a younger age if you are overweight or have a high risk for diabetes. What should I know about preventing infection? Hepatitis B If you have a higher risk for hepatitis B, you should be screened for this virus. Talk with your health care provider to find out if you are at risk for hepatitis B infection. Hepatitis C Testing is recommended for:  Everyone born from [birth year] through 1963.  Anyone with known risk factors for hepatitis C.  Sexually transmitted infections (STIs)  Get screened for STIs, including gonorrhea and chlamydia, if: ? You are sexually active and are younger than 57 years of age. ? You are older than 57 years of age and your health care provider tells you that you are at risk for this type of infection. ? Your sexual activity has changed since you were last screened, and you are at increased risk for chlamydia or  gonorrhea. Ask your health care provider if you are at risk.  Ask your health care provider about whether you are at high risk for HIV. Your health care provider may recommend a prescription medicine to help prevent HIV infection. If you choose to take medicine to prevent HIV, you should first get tested for HIV. You should then be tested every 3 months for as long as you are taking the medicine. Pregnancy  If you are about to stop having your period (premenopausal) and you may become pregnant, seek counseling before you get pregnant.  Take 400 to 800 micrograms (mcg) of folic acid every day if you become pregnant.  Ask for birth control (contraception) if you want to prevent pregnancy. Osteoporosis and menopause Osteoporosis is a disease in which the bones lose minerals and strength with aging. This can result in bone fractures. If you are 54 years old or older, or if you are at risk for osteoporosis and fractures, ask your health care provider if you should:  Be screened for bone loss.  Take a calcium or vitamin D supplement to lower your risk of fractures.  Be given hormone replacement therapy (HRT) to treat symptoms of menopause. Follow these instructions at home: Lifestyle  Do not use any products that contain nicotine or tobacco, such as cigarettes, e-cigarettes, and chewing tobacco. If you need help quitting, ask your health care provider.  Do not use street drugs.  Do not share needles.  Ask your health care provider for help if you need support or information about quitting drugs. Alcohol use  Do not drink alcohol if: ? Your health care provider tells you not to drink. ? You are pregnant, may be pregnant, or are planning to become pregnant.  If you drink alcohol: ? Limit how much you use to 0-1 drink a day. ? Limit intake if you are breastfeeding.  Be aware of how much alcohol is in your drink. In the [place] one drink equals one 12 oz bottle of beer (355 mL), one 5 oz  glass of wine (148 mL), or one 1 oz glass of hard liquor (44 mL). General instructions  Schedule regular health, dental, and eye exams.  Stay current with your vaccines.  Tell your health care provider if: ? You often feel depressed. ? You have ever been abused or do not feel safe at home. Summary  Adopting a healthy lifestyle and getting preventive care are important in promoting health and wellness.  Follow your health care provider's instructions about healthy diet, exercising, and getting tested or screened for diseases.  Follow your health care provider's instructions on monitoring your cholesterol and blood pressure. This information is not intended to replace advice given to you by your health care provider. Make sure you discuss any questions you have with your health care provider. Document Revised: 12/06/2018 Document Reviewed: 12/06/2018 Elsevier Patient Education  2020 [employer].

## 2020-11-09 NOTE — Progress Notes (Signed)
Subjective:    Patient ID: Maria Barker, female    DOB: 1961-05-26, 59 y.o.   MRN: [id number]   This visit occurred during the SARS-CoV-2 public health emergency.  Safety protocols were in place, including screening questions prior to the visit, additional usage of staff PPE, and extensive cleaning of exam room while observing appropriate contact time as indicated for disinfecting solutions.    HPI She is here for a physical exam.   Last week she was very tired and just did not feel well.  She was also having difficulty sleeping, which made it worse.  She did not have a fever or cold symptoms.  She feels something like this happens every year at this time and she is not sure why.  She has no concerns.  Medications and allergies reviewed with patient and updated if appropriate.  Patient Active Problem List   Diagnosis Date Noted  . Left hip pain 05/08/2020  . Status post left hip replacement 01/03/2020  . Epigastric pain 03/28/2019  . Rash on lips 01/08/2019  . Essential hypertension 01/08/2019  . De Quervain's tenosynovitis, right 03/09/2018  . Prediabetes 09/17/2017  . Osteoarthritis of left knee 04/12/2017  . B12 deficiency 08/13/2016  . S/P bilateral hip replacements 08/13/2016  . Family history of brain aneurysm 08/12/2015  . Gastroesophageal reflux disease without esophagitis 08/12/2015  . Hypothyroid 02/06/2015  . Hyperlipidemia   . Chronic insomnia 04/13/2013    Current Outpatient Medications on File Prior to Visit  Medication Sig Dispense Refill  . atorvastatin (LIPITOR) 10 MG tablet Take 1 tablet (10 mg total) by mouth daily. Annual appt due in Nov must see provider for future refills 90 tablet 0  . Calcium Carbonate-Vitamin D (CALCIUM-VITAMIN D3 PO) Take 1 each by mouth 2 (two) times daily.    Marland Kitchen estradiol (ESTRACE) 0.1 MG/GM vaginal cream Place vaginally.    Marland Kitchen levothyroxine (EUTHYROX) 50 MCG tablet TAKE 1 TABLET BY MOUTH ONCE DAILY 6  DAYS  A  WEEK 90 tablet 0    . olmesartan (BENICAR) 5 MG tablet Take 1 tablet (5 mg total) by mouth daily. Annual appt due in Nov must see provider for future refills 90 tablet 1  . Omega-3 1000 MG CAPS Take 2,000 mg by mouth.    Marland Kitchen omeprazole (PRILOSEC) 20 MG capsule TAKE 1 CAPSULE BY MOUTH ONCE DAILY 30  MINUTES  PRIOR  TO  A  MEAL 30 capsule 2  . vitamin B-12 (CYANOCOBALAMIN) 1000 MCG tablet Take 1,000 mcg by mouth daily.    Marland Kitchen zolpidem (AMBIEN) 5 MG tablet TAKE 1 TABLET BY MOUTH AT BEDTIME AS NEEDED FOR SLEEP 30 tablet 0   No current facility-administered medications on file prior to visit.    Past Medical History:  Diagnosis Date  . Arthritis    R THR 2011  . Back pain   . Frequent headaches   . Hyperlipidemia LDL goal < 130   . Hypertension   . Hypothyroid 02/06/2015   Dx 01/2015: TSH 5.4  . Insomnia   . Left hip pain   . Pain of left thigh     Past Surgical History:  Procedure Laterality Date  . 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 TOTAL HIP ARTHROPLASTY Left 03/08/2016   Procedure: TOTAL HIP ARTHROPLASTY;  Surgeon: Frederik Pear, MD;  Location: [place];  Service: Orthopedics;  Laterality: Left;  . TUBAL LIGATION    . UPPER GASTROINTESTINAL ENDOSCOPY      Social  History   Socioeconomic History  . Marital status: Married    Spouse name: Not on file  . Number of children: 5  . Years of education: Not on file  . Highest education level: Not on file  Occupational History  . Occupation: IT sales professional: [employer]  Tobacco Use  . Smoking status: Never Smoker  . Smokeless tobacco: Never Used  Substance and Sexual Activity  . Alcohol use: No  . Drug use: No  . Sexual activity: Not on file  Other Topics Concern  . Not on file  Social History Narrative  . Not on file   Social Determinants of Health   Financial Resource Strain:   . Difficulty of Paying Living Expenses: Not on file  Food Insecurity:   . Worried About Charity fundraiser in the Last Year: Not on file   . Ran Out of Food in the Last Year: Not on file  Transportation Needs:   . Lack of Transportation (Medical): Not on file  . Lack of Transportation (Non-Medical): Not on file  Physical Activity:   . Days of Exercise per Week: Not on file  . Minutes of Exercise per Session: Not on file  Stress:   . Feeling of Stress : Not on file  Social Connections:   . Frequency of Communication with Friends and Family: Not on file  . Frequency of Social Gatherings with Friends and Family: Not on file  . Attends Religious Services: Not on file  . Active Member of Clubs or Organizations: Not on file  . Attends Archivist Meetings: Not on file  . Marital Status: Not on file    Family History  Problem Relation Age of Onset  . CVA Mother   . Hypertension Mother     Review of Systems  Constitutional: Negative for chills and fever.  Eyes: Negative for visual disturbance.  Respiratory: Positive for cough (allergies). Negative for shortness of breath and wheezing.   Cardiovascular: Negative for chest pain, palpitations and leg swelling.  Gastrointestinal: Negative for abdominal pain, blood in stool, constipation, diarrhea and nausea.       Gerd controlled  Genitourinary: Negative for dysuria and hematuria.  Musculoskeletal: Positive for arthralgias and back pain.  Skin: Negative for rash.  Neurological: Positive for light-headedness. Negative for headaches.  Psychiatric/Behavioral: Negative for dysphoric mood. The patient is not nervous/anxious.        Objective:   Vitals:   11/10/20 0814  BP: 136/80  Pulse: 67  Temp: 98 F (36.7 C)  SpO2: 97%   Filed Weights   11/10/20 0814  Weight: 132 lb (59.9 kg)   Body mass index is 26.66 kg/m.  BP Readings from Last 3 Encounters:  11/10/20 136/80  05/08/20 140/82  12/15/19 (!) 144/77    Wt Readings from Last 3 Encounters:  11/10/20 132 lb (59.9 kg)  05/08/20 130 lb (59 kg)  03/13/20 126 lb (57.2 kg)    Depression screen  PHQ 2/9 11/10/2020  Decreased Interest 0  Down, Depressed, Hopeless 0  PHQ - 2 Score 0  Altered sleeping 2  Tired, decreased energy 1  Change in appetite 0  Feeling bad or failure about yourself  0  Trouble concentrating 0  Moving slowly or fidgety/restless 0  Suicidal thoughts 0  PHQ-9 Score 3  Difficult doing work/chores Not difficult at all     Physical Exam Constitutional: She appears well-developed and well-nourished. No distress.  HENT:  Head: Normocephalic and  atraumatic.  Right Ear: External ear normal. Normal ear canal and TM Left Ear: External ear normal.  Normal ear canal and TM Mouth/Throat: Oropharynx is clear and moist.  Eyes: Conjunctivae and EOM are normal.  Neck: Neck supple. No tracheal deviation present. No thyromegaly present.  No carotid bruit  Cardiovascular: Normal rate, regular rhythm and normal heart sounds.   No murmur heard.  No edema. Pulmonary/Chest: Effort normal and breath sounds normal. No respiratory distress. She has no wheezes. She has no rales.  Breast: deferred   Abdominal: Soft. She exhibits no distension. There is no tenderness.  Lymphadenopathy: She has no cervical adenopathy.  Skin: Skin is warm and dry. She is not diaphoretic.  Psychiatric: She has a normal mood and affect. Her behavior is normal.        Assessment & Plan:   Physical exam: Screening blood work    ordered Immunizations  Flu vac today, had Covid, discussed shingrix Colonoscopy  Up to date  Mammogram  Up to date  Gyn  Up to date - Dr Corinna Capra Dexa:  Up to date - Dr Corinna Capra Eye exams  Up to date  Exercise  none Weight  Normal  Substance abuse    none   Screened for depression using the PHQ 9 scale.  No evidence of depression.  She does have insomnia, which is causing her sleep issues as indicated, but no depression.   See Problem List for Assessment and Plan of chronic medical problems.

## 2020-11-10 ENCOUNTER — Ambulatory Visit (INDEPENDENT_AMBULATORY_CARE_PROVIDER_SITE_OTHER): Payer: Commercial Managed Care - PPO | Admitting: Internal Medicine

## 2020-11-10 ENCOUNTER — Encounter: Payer: Self-pay | Admitting: Internal Medicine

## 2020-11-10 ENCOUNTER — Other Ambulatory Visit: Payer: Self-pay

## 2020-11-10 VITALS — BP 136/80 | HR 67 | Temp 98.0°F | Ht 59.0 in | Wt 132.0 lb

## 2020-11-10 DIAGNOSIS — Z0000 Encounter for general adult medical examination without abnormal findings: Secondary | ICD-10-CM | POA: Diagnosis not present

## 2020-11-10 DIAGNOSIS — E038 Other specified hypothyroidism: Secondary | ICD-10-CM

## 2020-11-10 DIAGNOSIS — K219 Gastro-esophageal reflux disease without esophagitis: Secondary | ICD-10-CM

## 2020-11-10 DIAGNOSIS — Z23 Encounter for immunization: Secondary | ICD-10-CM | POA: Diagnosis not present

## 2020-11-10 DIAGNOSIS — E782 Mixed hyperlipidemia: Secondary | ICD-10-CM

## 2020-11-10 DIAGNOSIS — I10 Essential (primary) hypertension: Secondary | ICD-10-CM | POA: Diagnosis not present

## 2020-11-10 DIAGNOSIS — R7303 Prediabetes: Secondary | ICD-10-CM

## 2020-11-10 DIAGNOSIS — F5104 Psychophysiologic insomnia: Secondary | ICD-10-CM

## 2020-11-10 LAB — LIPID PANEL
Cholesterol: 166 mg/dL (ref 0–200)
HDL: 58.3 mg/dL (ref >39.00)
LDL Cholesterol: 73 mg/dL (ref 0–99)
NonHDL: 107.66
Total CHOL/HDL Ratio: 3
Triglycerides: 173 mg/dL — ABNORMAL HIGH (ref 0.0–149.0)
VLDL: 34.6 mg/dL (ref 0.0–40.0)

## 2020-11-10 LAB — CBC WITH DIFFERENTIAL/PLATELET
Basophils Absolute: 0.1 10*3/uL (ref 0.0–0.1)
Basophils Relative: 1.3 % (ref 0.0–3.0)
Eosinophils Absolute: 0.9 10*3/uL — ABNORMAL HIGH (ref 0.0–0.7)
Eosinophils Relative: 11.5 % — ABNORMAL HIGH (ref 0.0–5.0)
HCT: 37.1 % (ref 36.0–46.0)
Hemoglobin: 12.2 g/dL (ref 12.0–15.0)
Lymphocytes Relative: 35.8 % (ref 12.0–46.0)
Lymphs Abs: 2.8 10*3/uL (ref 0.7–4.0)
MCHC: 32.9 g/dL (ref 30.0–36.0)
MCV: 84.7 fl (ref 78.0–100.0)
Monocytes Absolute: 0.7 10*3/uL (ref 0.1–1.0)
Monocytes Relative: 8.3 % (ref 3.0–12.0)
Neutro Abs: 3.4 10*3/uL (ref 1.4–7.7)
Neutrophils Relative %: 43.1 % (ref 43.0–77.0)
Platelets: 312 10*3/uL (ref 150.0–400.0)
RBC: 4.38 Mil/uL (ref 3.87–5.11)
RDW: 13.7 % (ref 11.5–15.5)
WBC: 7.9 10*3/uL (ref 4.0–10.5)

## 2020-11-10 LAB — COMPREHENSIVE METABOLIC PANEL
ALT: 44 U/L — ABNORMAL HIGH (ref 0–35)
AST: 30 U/L (ref 0–37)
Albumin: 4 g/dL (ref 3.5–5.2)
Alkaline Phosphatase: 62 U/L (ref 39–117)
BUN: 24 mg/dL — ABNORMAL HIGH (ref 6–23)
CO2: 25 mEq/L (ref 19–32)
Calcium: 9.1 mg/dL (ref 8.4–10.5)
Chloride: 104 mEq/L (ref 96–112)
Creatinine, Ser: 0.67 mg/dL (ref 0.40–1.20)
GFR: 97.02 mL/min (ref >60.00)
Glucose, Bld: 82 mg/dL (ref 70–99)
Potassium: 4 mEq/L (ref 3.5–5.1)
Sodium: 136 mEq/L (ref 135–145)
Total Bilirubin: 0.3 mg/dL (ref 0.2–1.2)
Total Protein: 8 g/dL (ref 6.0–8.3)

## 2020-11-10 LAB — TSH: TSH: 3.89 u[IU]/mL (ref 0.35–4.50)

## 2020-11-10 LAB — HEMOGLOBIN A1C: Hgb A1c MFr Bld: 6.1 % (ref 4.6–6.5)

## 2020-11-10 NOTE — Assessment & Plan Note (Signed)
Chronic GERD controlled Continue omeprazole 20 mg daily  

## 2020-11-10 NOTE — Assessment & Plan Note (Signed)
Chronic  Clinically euthyroid Currently taking levothyroxine 50 mcg 6 days a week Check tsh  Titrate med dose if needed

## 2020-11-10 NOTE — Assessment & Plan Note (Signed)
Chronic Check a1c Low sugar / carb diet Stressed regular exercise  

## 2020-11-10 NOTE — Assessment & Plan Note (Signed)
Chronic Check lipid panel  Continue atorvastatin 10 mg daily Regular exercise and healthy diet encouraged  

## 2020-11-10 NOTE — Assessment & Plan Note (Signed)
Chronic BP well controlled Continue Benicar 5 mg daily cmp

## 2020-11-10 NOTE — Assessment & Plan Note (Addendum)
Chronic Controlled, stable Continue ambien 5 mg nightly prn - works most of the time

## 2020-11-19 NOTE — Addendum Note (Signed)
Addended by: Marcina Millard on: 11/19/2020 01:26 PM   Modules accepted: Orders

## 2020-11-27 ENCOUNTER — Encounter: Payer: Self-pay | Admitting: Internal Medicine

## 2020-11-27 DIAGNOSIS — M8580 Other specified disorders of bone density and structure, unspecified site: Secondary | ICD-10-CM | POA: Insufficient documentation

## 2020-12-04 ENCOUNTER — Encounter: Payer: Self-pay | Admitting: Internal Medicine

## 2020-12-04 NOTE — Progress Notes (Signed)
Outside notes received. Information abstracted. Notes sent to scan.  

## 2020-12-12 ENCOUNTER — Ambulatory Visit: Payer: Self-pay

## 2020-12-26 ENCOUNTER — Other Ambulatory Visit: Payer: Self-pay | Admitting: Internal Medicine

## 2020-12-29 ENCOUNTER — Other Ambulatory Visit: Payer: Self-pay | Admitting: Internal Medicine

## 2021-01-07 ENCOUNTER — Other Ambulatory Visit: Payer: Self-pay

## 2021-01-07 ENCOUNTER — Ambulatory Visit
Admission: EM | Admit: 2021-01-07 | Discharge: 2021-01-07 | Disposition: A | Discharge status: Home / Self Care | Payer: Commercial Managed Care - PPO | Attending: Emergency Medicine | Admitting: Emergency Medicine

## 2021-01-07 DIAGNOSIS — J069 Acute upper respiratory infection, unspecified: Secondary | ICD-10-CM | POA: Diagnosis not present

## 2021-01-07 DIAGNOSIS — Z20822 Contact with and (suspected) exposure to covid-19: Secondary | ICD-10-CM | POA: Insufficient documentation

## 2021-01-07 LAB — POCT RAPID STREP A (OFFICE): Rapid Strep A Screen: NEGATIVE

## 2021-01-07 MED ORDER — CETIRIZINE HCL 10 MG PO CAPS: 10.0000 mg | ORAL_CAPSULE | Freq: Every day | ORAL | 0 refills | Status: DC

## 2021-01-07 NOTE — ED Provider Notes (Signed)
[facility]    CSN: [id number] Arrival date & time: 01/07/21  [id number]      History   Chief Complaint Chief Complaint  Patient presents with  . Cough    HPI Emmry Hopf is a 60 y.o. female patient in today for evaluation of URI symptoms.  Reports that she has had cough, congestion, sore throat and ear pain.  Reports 3 days ago developed headaches slight body aches cough and sore throat.  Reports last night her throat pain worsened and has been radiating to her left ear, main complaint today is her sore throat.  Reports slight dry cough, but relates this to the throat irritation.  Denies any known fevers.  Denies any known COVID exposures.  HPI  Past Medical History:  Diagnosis Date  . Arthritis    R THR 2011  . Back pain   . Frequent headaches   . Hyperlipidemia LDL goal < 130   . Hypertension   . Hypothyroid 02/06/2015   Dx 01/2015: TSH 5.4  . Insomnia   . Left hip pain   . Pain of left thigh     Patient Active Problem List   Diagnosis Date Noted  . Osteopenia 11/27/2020  . Left hip pain 05/08/2020  . Epigastric pain 03/28/2019  . Rash on lips 01/08/2019  . Essential hypertension 01/08/2019  . De Quervain's tenosynovitis, right 03/09/2018  . Prediabetes 09/17/2017  . Osteoarthritis of left knee 04/12/2017  . B12 deficiency 08/13/2016  . S/P bilateral hip replacements 08/13/2016  . Family history of brain aneurysm 08/12/2015  . Gastroesophageal reflux disease without esophagitis 08/12/2015  . Hypothyroid 02/06/2015  . Hyperlipidemia   . Chronic insomnia 04/13/2013    Past Surgical History:  Procedure Laterality Date  . 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 TOTAL HIP ARTHROPLASTY Left 03/08/2016   Procedure: TOTAL HIP ARTHROPLASTY;  Surgeon: Frederik Pear, MD;  Location: [place];  Service: Orthopedics;  Laterality: Left;  . TUBAL LIGATION    . UPPER GASTROINTESTINAL ENDOSCOPY      OB History   No obstetric history on file.      Home  Medications    Prior to Admission medications   Medication Sig Start Date End Date Taking? Authorizing Provider  Cetirizine HCl 10 MG CAPS Take 1 capsule (10 mg total) by mouth daily for 10 days. 01/07/21 01/17/21 Yes Wieters, Hallie C, PA-C  atorvastatin (LIPITOR) 10 MG tablet Take 1 tablet (10 mg total) by mouth daily. Annual appt due in Nov must see provider for future refills 09/15/20   Binnie Rail, MD  Calcium Carbonate-Vitamin D (CALCIUM-VITAMIN D3 PO) Take 1 each by mouth 2 (two) times daily.    [provider]  estradiol (ESTRACE) 0.1 MG/GM vaginal cream Place vaginally. 10/26/20   [provider]  EUTHYROX 50 MCG tablet TAKE 1 TABLET BY MOUTH ONCE DAILY 6  DAYS  A  WEEK 12/29/20   Burns, Claudina Lick, MD  olmesartan (BENICAR) 5 MG tablet Take 1 tablet (5 mg total) by mouth daily. Annual appt due in Nov must see provider for future refills 07/14/20   Binnie Rail, MD  Omega-3 1000 MG CAPS Take 2,000 mg by mouth.    [provider]  omeprazole (PRILOSEC) 20 MG capsule TAKE 1 CAPSULE BY MOUTH ONCE DAILY 30  MINUTES  PRIOR  TO  A  MEAL 08/27/20   Burns, Claudina Lick, MD  vitamin B-12 (CYANOCOBALAMIN) 1000 MCG tablet Take 1,000 mcg  by mouth daily.    [provider]  zolpidem (AMBIEN) 5 MG tablet TAKE 1 TABLET BY MOUTH AT BEDTIME AS NEEDED FOR SLEEP 10/09/20   Binnie Rail, MD    Family History Family History  Problem Relation Age of Onset  . CVA Mother   . Hypertension Mother     Social History Social History   Tobacco Use  . Smoking status: Never Smoker  . Smokeless tobacco: Never Used  Substance Use Topics  . Alcohol use: No  . Drug use: No     Allergies   Ibuprofen and Asa [aspirin]   Review of Systems Review of Systems  Constitutional: Negative for activity change, appetite change, chills, fatigue and fever.  HENT: Positive for ear pain and sore throat. Negative for congestion, rhinorrhea, sinus pressure and trouble swallowing.   Eyes:  Negative for discharge and redness.  Respiratory: Positive for cough. Negative for chest tightness and shortness of breath.   Cardiovascular: Negative for chest pain.  Gastrointestinal: Negative for abdominal pain, diarrhea, nausea and vomiting.  Musculoskeletal: Negative for myalgias.  Skin: Negative for rash.  Neurological: Negative for dizziness, light-headedness and headaches.     Physical Exam Triage Vital Signs ED Triage Vitals  Enc Vitals Group     BP      Pulse      Resp      Temp      Temp src      SpO2      Weight      Height      Head Circumference      Peak Flow      Pain Score      Pain Loc      Pain Edu?      Excl. in [place]?    No data found.  Updated Vital Signs BP 125/79 (BP Location: Left Arm)   Pulse 69   Temp 98 F (36.7 C) (Oral)   Resp 16   SpO2 97%   Visual Acuity Right Eye Distance:   Left Eye Distance:   Bilateral Distance:    Right Eye Near:   Left Eye Near:    Bilateral Near:     Physical Exam Vitals and nursing note reviewed.  Constitutional:      Appearance: She is well-developed and well-nourished.     Comments: No acute distress  HENT:     Head: Normocephalic and atraumatic.     Ears:     Comments: Bilateral ears without tenderness to palpation of external auricle, tragus and mastoid, EAC's without erythema or swelling, TM's with good bony landmarks and cone of light. Non erythematous.     Nose: Nose normal.     Mouth/Throat:     Comments: Oral mucosa pink and moist, no tonsillar enlargement or exudate. Posterior pharynx patent and nonerythematous, no uvula deviation or swelling. Normal phonation. Eyes:     Conjunctiva/sclera: Conjunctivae normal.  Cardiovascular:     Rate and Rhythm: Normal rate.  Pulmonary:     Effort: Pulmonary effort is normal. No respiratory distress.     Comments: Breathing comfortably at rest, CTABL, no wheezing, rales or other adventitious sounds auscultated Abdominal:     General: There is no  distension.  Musculoskeletal:        General: Normal range of motion.     Cervical back: Neck supple.  Skin:    General: Skin is warm and dry.  Neurological:     Mental Status: She is alert and  oriented to person, place, and time.  Psychiatric:        Mood and Affect: Mood and affect normal.      UC Treatments / Results  Labs (all labs ordered are listed, but only abnormal results are displayed) Labs Reviewed  NOVEL CORONAVIRUS, NAA  CULTURE, GROUP A STREP [facility])  POCT RAPID STREP A (OFFICE)    EKG   Radiology No results found.  Procedures Procedures (including critical care time)  Medications Ordered in UC Medications - No data to display  Initial Impression / Assessment and Plan / UC Course  I have reviewed the triage vital signs and the nursing notes.  Pertinent labs & imaging results that were available during my care of the patient were reviewed by me and considered in my medical decision making (see chart for details).     Viral URI with cough-strep test negative, COVID test pending, recommending symptomatic and supportive care, suspect viral etiology.  Rest and fluids.  Discussed strict return precautions. Patient verbalized understanding and is agreeable with plan.  Final Clinical Impressions(s) / UC Diagnoses   Final diagnoses:  Encounter for screening laboratory testing for COVID-19 virus  Viral URI with cough     Discharge Instructions     Sore Throat  Your rapid strep tested Negative today. COVID test pending, symptoms are most likely from a viral URI/postnasal drainage  Please continue Tylenol or Ibuprofen for fever and pain. May try salt water gargles, cepacol lozenges, throat spray, or OTC cold relief medicine for throat discomfort. If you also have congestion take a daily anti-histamine like Zyrtec, Claritin, and a oral decongestant to help with post nasal drip that may be irritating your throat.   Stay hydrated and drink plenty of fluids  to keep your throat coated relieve irritation.      ED Prescriptions    Medication Sig Dispense Auth. Provider   Cetirizine HCl 10 MG CAPS Take 1 capsule (10 mg total) by mouth daily for 10 days. 10 capsule Wieters, [place] C, PA-C     PDMP not reviewed this encounter.   Janith Lima, [place] 01/07/21 [phone]

## 2021-01-07 NOTE — ED Triage Notes (Signed)
Pt c/o dry cough, headache, and sore throat since Monday. States having lt ear pain since yesterday.

## 2021-01-07 NOTE — Discharge Instructions (Addendum)
Sore Throat  Your rapid strep tested Negative today. COVID test pending, symptoms are most likely from a viral URI/postnasal drainage  Please continue Tylenol or Ibuprofen for fever and pain. May try salt water gargles, cepacol lozenges, throat spray, or OTC cold relief medicine for throat discomfort. If you also have congestion take a daily anti-histamine like Zyrtec, Claritin, and a oral decongestant to help with post nasal drip that may be irritating your throat.   Stay hydrated and drink plenty of fluids to keep your throat coated relieve irritation.

## 2021-01-08 ENCOUNTER — Other Ambulatory Visit: Payer: Self-pay | Admitting: Internal Medicine

## 2021-01-09 LAB — SARS-COV-2, NAA 2 DAY TAT

## 2021-01-09 LAB — CULTURE, GROUP A STREP (THRC)

## 2021-01-09 LAB — NOVEL CORONAVIRUS, NAA: SARS-CoV-2, NAA: DETECTED — AB

## 2021-01-18 ENCOUNTER — Other Ambulatory Visit: Payer: Self-pay | Admitting: Internal Medicine

## 2021-02-03 ENCOUNTER — Other Ambulatory Visit: Payer: Self-pay | Admitting: Internal Medicine

## 2021-04-14 ENCOUNTER — Other Ambulatory Visit: Payer: Self-pay | Admitting: Internal Medicine

## 2021-04-20 ENCOUNTER — Other Ambulatory Visit: Payer: Self-pay | Admitting: Internal Medicine

## 2021-05-10 NOTE — Progress Notes (Signed)
Subjective:    Patient ID: Maria Barker, female    DOB: January 13, 1961, 60 y.o.   MRN: [id number]  HPI The patient is here for follow up of their chronic medical problems, including htn, hyperlipidemia, prediabetes, hypothyroidism, insomnia, GERD  Left leg pain - it laying down on left side has pain from her knee up her thigh.  She is s/p b/l hip replacements.  Has left knee OA.  No pain with walking.  Some lower back pain.   B/l elbow pain from work.  Not working helps, but then goes back to work where she does repetitive movement and pain comes back.    She is not exercising.    Medications and allergies reviewed with patient and updated if appropriate.  Patient Active Problem List   Diagnosis Date Noted  . Osteopenia 11/27/2020  . Left hip pain 05/08/2020  . Epigastric pain 03/28/2019  . Rash on lips 01/08/2019  . Essential hypertension 01/08/2019  . De Quervain's tenosynovitis, right 03/09/2018  . Prediabetes 09/17/2017  . Osteoarthritis of left knee 04/12/2017  . B12 deficiency 08/13/2016  . S/P bilateral hip replacements 08/13/2016  . Family history of brain aneurysm 08/12/2015  . Gastroesophageal reflux disease without esophagitis 08/12/2015  . Hypothyroid 02/06/2015  . Hyperlipidemia   . Chronic insomnia 04/13/2013    Current Outpatient Medications on File Prior to Visit  Medication Sig Dispense Refill  . atorvastatin (LIPITOR) 10 MG tablet TAKE 1 TABLET BY MOUTH ONCE DAILY MUST  SEE  DOCTOR  FOR  REFILLS  ANNUAL  APPOINTMENT  IN  NOVEMBER 90 tablet 0  . Calcium Carbonate-Vitamin D (CALCIUM-VITAMIN D3 PO) Take 1 each by mouth 2 (two) times daily.    Marland Kitchen estradiol (ESTRACE) 0.1 MG/GM vaginal cream Place vaginally.    . EUTHYROX 50 MCG tablet TAKE 1 TABLET BY MOUTH ONCE DAILY 6 DAYS A WEEK 90 tablet 0  . olmesartan (BENICAR) 5 MG tablet TAKE 1 TABLET BY MOUTH ONCE DAILY -  APPOINTMENT  NEEDED  FOR  FUTURE  REFILLS 90 tablet 0  . Omega-3 1000 MG CAPS Take 2,000 mg by  mouth.    Marland Kitchen omeprazole (PRILOSEC) 20 MG capsule TAKE 1 CAPSULE BY MOUTH ONCE DAILY 30  MINUTES  PRIOR  TO  A  MEAL 30 capsule 2  . vitamin B-12 (CYANOCOBALAMIN) 1000 MCG tablet Take 1,000 mcg by mouth daily.    Marland Kitchen zolpidem (AMBIEN) 5 MG tablet TAKE 1 TABLET BY MOUTH AT BEDTIME AS NEEDED FOR SLEEP 30 tablet 0  . Cetirizine HCl 10 MG CAPS Take 1 capsule (10 mg total) by mouth daily for 10 days. 10 capsule 0   No current facility-administered medications on file prior to visit.    Past Medical History:  Diagnosis Date  . Arthritis    R THR 2011  . Back pain   . Frequent headaches   . Hyperlipidemia LDL goal < 130   . Hypertension   . Hypothyroid 02/06/2015   Dx 01/2015: TSH 5.4  . Insomnia   . Left hip pain   . Pain of left thigh     Past Surgical History:  Procedure Laterality Date  . 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 TOTAL HIP ARTHROPLASTY Left 03/08/2016   Procedure: TOTAL HIP ARTHROPLASTY;  Surgeon: Frederik Pear, MD;  Location: [facility];  Service: Orthopedics;  Laterality: Left;  . TUBAL LIGATION    . UPPER GASTROINTESTINAL ENDOSCOPY      Social History  Socioeconomic History  . Marital status: Married    Spouse name: Not on file  . Number of children: 5  . Years of education: Not on file  . Highest education level: Not on file  Occupational History  . Occupation: IT sales professional: [employer]  Tobacco Use  . Smoking status: Never Smoker  . Smokeless tobacco: Never Used  Substance and Sexual Activity  . Alcohol use: No  . Drug use: No  . Sexual activity: Not on file  Other Topics Concern  . Not on file  Social History Narrative  . Not on file   Social Determinants of Health   Financial Resource Strain: Not on file  Food Insecurity: Not on file  Transportation Needs: Not on file  Physical Activity: Not on file  Stress: Not on file  Social Connections: Not on file    Family History  Problem Relation Age of Onset  . CVA Mother    . Hypertension Mother     Review of Systems  Constitutional: Negative for chills and fever.  Respiratory: Negative for cough, shortness of breath and wheezing.   Cardiovascular: Negative for chest pain, palpitations and leg swelling.  Gastrointestinal: Positive for abdominal pain (occ sharp pain lower esophagus).       Freq gerd - ? Related to stress  Neurological: Negative for light-headedness and headaches.       Objective:   Vitals:   05/11/21 0833  BP: 118/72  Pulse: 71  Temp: 97.8 F (36.6 C)  SpO2: 97%   BP Readings from Last 3 Encounters:  05/11/21 118/72  01/07/21 125/79  11/10/20 136/80   Wt Readings from Last 3 Encounters:  05/11/21 127 lb (57.6 kg)  11/10/20 132 lb (59.9 kg)  05/08/20 130 lb (59 kg)   Body mass index is 25.65 kg/m.   Physical Exam    Constitutional: Appears well-developed and well-nourished. No distress.  HENT:  Head: Normocephalic and atraumatic.  Neck: Neck supple. No tracheal deviation present. No thyromegaly present.  No cervical lymphadenopathy Cardiovascular: Normal rate, regular rhythm and normal heart sounds.   No murmur heard. No carotid bruit .  No edema Pulmonary/Chest: Effort normal and breath sounds normal. No respiratory distress. No has no wheezes. No rales.  Skin: Skin is warm and dry. Not diaphoretic.  Psychiatric: Normal mood and affect. Behavior is normal.      Assessment & Plan:    See Problem List for Assessment and Plan of chronic medical problems.    This visit occurred during the SARS-CoV-2 public health emergency.  Safety protocols were in place, including screening questions prior to the visit, additional usage of staff PPE, and extensive cleaning of exam room while observing appropriate contact time as indicated for disinfecting solutions.

## 2021-05-10 NOTE — Patient Instructions (Addendum)
  Blood work was ordered.     Medications changes include :   none   A referral was ordered for sports medicine.     Someone from their office will call you to schedule an appointment.    Please followup in 6 months

## 2021-05-11 ENCOUNTER — Other Ambulatory Visit: Payer: Self-pay

## 2021-05-11 ENCOUNTER — Encounter: Payer: Self-pay | Admitting: Internal Medicine

## 2021-05-11 ENCOUNTER — Ambulatory Visit: Payer: Commercial Managed Care - PPO | Admitting: Internal Medicine

## 2021-05-11 VITALS — BP 118/72 | HR 71 | Temp 97.8°F | Ht 59.0 in | Wt 127.0 lb

## 2021-05-11 DIAGNOSIS — E782 Mixed hyperlipidemia: Secondary | ICD-10-CM

## 2021-05-11 DIAGNOSIS — R7303 Prediabetes: Secondary | ICD-10-CM | POA: Diagnosis not present

## 2021-05-11 DIAGNOSIS — E038 Other specified hypothyroidism: Secondary | ICD-10-CM | POA: Diagnosis not present

## 2021-05-11 DIAGNOSIS — E538 Deficiency of other specified B group vitamins: Secondary | ICD-10-CM

## 2021-05-11 DIAGNOSIS — F5104 Psychophysiologic insomnia: Secondary | ICD-10-CM

## 2021-05-11 DIAGNOSIS — M79605 Pain in left leg: Secondary | ICD-10-CM | POA: Insufficient documentation

## 2021-05-11 DIAGNOSIS — M25521 Pain in right elbow: Secondary | ICD-10-CM

## 2021-05-11 DIAGNOSIS — I10 Essential (primary) hypertension: Secondary | ICD-10-CM

## 2021-05-11 DIAGNOSIS — K219 Gastro-esophageal reflux disease without esophagitis: Secondary | ICD-10-CM

## 2021-05-11 DIAGNOSIS — M25522 Pain in left elbow: Secondary | ICD-10-CM

## 2021-05-11 LAB — COMPREHENSIVE METABOLIC PANEL
ALT: 24 U/L (ref 0–35)
AST: 26 U/L (ref 0–37)
Albumin: 4.2 g/dL (ref 3.5–5.2)
Alkaline Phosphatase: 57 U/L (ref 39–117)
BUN: 18 mg/dL (ref 6–23)
CO2: 25 mEq/L (ref 19–32)
Calcium: 8.9 mg/dL (ref 8.4–10.5)
Chloride: 105 mEq/L (ref 96–112)
Creatinine, Ser: 0.6 mg/dL (ref 0.40–1.20)
GFR: 99.29 mL/min (ref >60.00)
Glucose, Bld: 92 mg/dL (ref 70–99)
Potassium: 4 mEq/L (ref 3.5–5.1)
Sodium: 136 mEq/L (ref 135–145)
Total Bilirubin: 0.3 mg/dL (ref 0.2–1.2)
Total Protein: 8.1 g/dL (ref 6.0–8.3)

## 2021-05-11 LAB — HEMOGLOBIN A1C: Hgb A1c MFr Bld: 6.1 % (ref 4.6–6.5)

## 2021-05-11 LAB — VITAMIN B12: Vitamin B-12: 1483 pg/mL — ABNORMAL HIGH (ref 211–911)

## 2021-05-11 LAB — TSH: TSH: 1.45 u[IU]/mL (ref 0.35–4.50)

## 2021-05-11 NOTE — Assessment & Plan Note (Signed)
Chronic Taking vitamin B12 daily Check vita B12 level

## 2021-05-11 NOTE — Assessment & Plan Note (Signed)
Chronic  Clinically euthyroid Currently taking levothyroxine 50 mcg 6 days a week Check tsh  Titrate med dose if needed

## 2021-05-11 NOTE — Assessment & Plan Note (Signed)
Chronic Taking omeprazole otc prn - takes for 14 days prn

## 2021-05-11 NOTE — Assessment & Plan Note (Signed)
Chronic BP well controlled Continue benicar 5 mg daily cmp

## 2021-05-11 NOTE — Assessment & Plan Note (Signed)
Chronic Check a1c Low sugar / carb diet Stressed regular exercise  

## 2021-05-11 NOTE — Assessment & Plan Note (Signed)
Chronic Check lipid panel  Continue atorvastatin 10 mg daily Regular exercise and healthy diet encouraged  

## 2021-05-11 NOTE — Assessment & Plan Note (Signed)
Acute Pain located near knee - no pain with walking, only pain with laying on left side so likely referred pain ? Lumbar or bursitis/hip in origin  Does not this further evaluated at this time Can see one of the orthopedics she has seen in the past if needed

## 2021-05-11 NOTE — Assessment & Plan Note (Signed)
Chronic Controlled, stable Continue ambien 5 mg HS prn

## 2021-05-11 NOTE — Assessment & Plan Note (Signed)
Acute Likely work related - works at [employer] - repetative movements ? tendinitis Will refer to sports med

## 2021-05-15 NOTE — Progress Notes (Signed)
   Subjective:    I'm seeing this patient as a consultation for:  Dr. Quay Burow. Note will be routed back to referring provider/PCP.  CC: Bilateral elbow pain  I, Wendy Poet, LAT, ATC, am serving as scribe for Dr. [name].  HPI: Pt is a 60 y/o female presenting w/  B elbow pain x a few weeks.  She locates her pain to lateral epicondyle w/ radiating pain to the posterior aspect of elbow. Pt works on an Designer, television/film set w/ repetitive motions. Pt c/o numbness into hand, esp at night time.  Radiating pain: yes Elbow swelling: Aggravating activity: repetitive movements; Treatments tried: Tylenol  Past medical history, Surgical history, Family history, Social history, Allergies, and medications have been entered into the medical record, reviewed.   Review of Systems: No new headache, visual changes, nausea, vomiting, diarrhea, constipation, dizziness, abdominal pain, skin rash, fevers, chills, night sweats, weight loss, swollen lymph nodes, body aches, joint swelling, muscle aches, chest pain, shortness of breath, mood changes, visual or auditory hallucinations.   Objective:    Vitals:   05/18/21 0811  BP: 126/82  Pulse: (!) 57  SpO2: 98%   General: Well Developed, well nourished, and in no acute distress.  Neuro/Psych: Alert and oriented x3, extra-ocular muscles intact, able to move all 4 extremities, sensation grossly intact. Skin: Warm and dry, no rashes noted.  Respiratory: Not using accessory muscles, speaking in full sentences, trachea midline.  Cardiovascular: Pulses palpable, no extremity edema. Abdomen: Does not appear distended. MSK: Right elbow normal-appearing normal motion normal strength.  Not particularly tender to palpation overlying medial and lateral epicondyle.  Mild tender palpation at anterior elbow at biceps tendon. Pain reproduced with resisted pronation.  Minimally reproduced with resisted supination.  Left elbow normal-appearing normal motion normal  strength Nontender to palpation medial lateral epicondyle.  Mild tender palpation anterior elbow at biceps tendon. Pain reproduced with resisted pronation.  Minimally reduced with resisted supination.  Wrist bilaterally nontender normal motion normal strength.  Unable to reproduced elbow pain with resisted wrist extension or flexion. Negative Tinel's and Phalen's test at the wrists bilaterally.    Impression and Recommendations:    Assessment and Plan: 60 y.o. female with bilateral elbow pain right worse than left.  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  Dominant finding is reproduced anterior elbow pain with resisted elbow and wrist pronation.  She does have some paresthesias to the radial aspect of her hands.  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  Plan for home exercise program taught in clinic today by ATC, and Voltaren gel.  If not improving in a few weeks on her own she will contact me and I will refer to hand physical therapy.  Recheck with me in 6 weeks..   Discussed warning signs or symptoms. Please see discharge instructions. Patient expresses understanding.   The above documentation has been reviewed and is accurate and complete [name], M.D.

## 2021-05-18 ENCOUNTER — Ambulatory Visit: Payer: Commercial Managed Care - PPO | Admitting: Family Medicine

## 2021-05-18 ENCOUNTER — Other Ambulatory Visit: Payer: Self-pay

## 2021-05-18 ENCOUNTER — Ambulatory Visit: Payer: Self-pay

## 2021-05-18 VITALS — BP 126/82 | HR 57 | Ht 59.0 in | Wt 128.0 lb

## 2021-05-18 DIAGNOSIS — G5611 Other lesions of median nerve, right upper limb: Secondary | ICD-10-CM

## 2021-05-18 DIAGNOSIS — G5612 Other lesions of median nerve, left upper limb: Secondary | ICD-10-CM

## 2021-05-18 DIAGNOSIS — M25522 Pain in left elbow: Secondary | ICD-10-CM | POA: Diagnosis not present

## 2021-05-18 DIAGNOSIS — M25521 Pain in right elbow: Secondary | ICD-10-CM | POA: Diagnosis not present

## 2021-05-18 NOTE — Patient Instructions (Addendum)
Thank you for coming in today.  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   Please complete the exercises that the athletic trainer went over with you: View at [email or web address] using code: [id number]  If not getting better in 3 weeks. Let me know and I will order physical therapy  Please use Voltaren gel (Generic Diclofenac Gel) up to 4x daily for pain as needed.  This is available over-the-counter as both the name brand Voltaren gel and the generic diclofenac gel.  Return in 6 weeks.  Let me know sooner if this is not working.    Pronator Syndrome  The median nerve is a nerve in the forearm that enables feeling and muscle function on certain parts of the hand. Pronator syndrome is a condition that happens when the median nerve has pressure (compression) placed on it by a muscle or other structure on the inner side of the forearm near the elbow. The condition can cause weakness or tingling in the thumb, index, middle, and ring fingers. It can also cause a dull ache or pain in the forearm. What are the causes? This condition may be caused by:  Overuse or repetitive movements that increase the size of a muscle in your forearm (pronator teres).  Trauma or a hard, direct hit to the forearm, resulting in swelling (hematoma).  A problem that is present at birth (congenital defect). What increases the risk? This condition is more likely to develop in:  Weightlifters.  People who play sports that require forearm rotation, such as baseball, tennis, or golf.  People who have a job that requires grasping objects and twisting the forearm, such as carpentry.  Adults who are 62-38 years old.  Females. What are the signs or symptoms? Symptoms of this condition include:  An ache or pain in the palm side of your forearm, close to your elbow.  A tingling or prickly feeling (sensation) in your thumb, index, middle, and half of your ring fingers.  Weakness in your hand, especially when pinching  your index finger and thumb together.  Symptoms that get worse with repetitive movement, gripping, or forearm rotation. How is this diagnosed? This condition may be diagnosed based on:  Your medical history.  A physical exam. Your health care provider may ask you to move your hand, fingers, wrist, and arm in certain ways. Doing this will help your health care provider find the source of your pain.  Tests and imaging studies, including: ? An electromyogram. This test can show how well the median nerve is working and show if there is too much pressure on it or a nearby nerve. ? A nerve conduction study. This test measures how well electrical signals pass through your nerves. ? X-ray. This test may be done to check for an underlying bone problem. How is this treated? This condition may be treated with:  Rest. Avoid or limit activities that cause your symptoms to get worse or flare up.  NSAIDs, such as ibuprofen, or steroid medicine. These medicines may be prescribed to help with pain and swelling.  A splint or brace. You may need to wear a splint or brace for support until your symptoms improve.  Exercises to help you maintain movement (physical or occupational therapy). This involves doing hand and arm exercises. If treatments do not help, you may need:  An injection of an anti-inflammatory medicine (steroid) mixed with a numbing medicine (local anesthetic).  Surgery. This is usually done only if other treatments fail and symptoms continue for more  than 6 months. Follow these instructions at home: If you have a splint or brace:  Wear it as told by your health care provider. Remove it only as told by your health care provider.  Loosen it if your fingers tingle, become numb, or turn cold and blue.  Keep it clean.  If the splint or brace is not waterproof: ? Do not let it get wet. ? Cover it with a watertight covering when you take a bath or shower. Managing pain, stiffness, and  swelling  Take over-the-counter and prescription medicines only as told by your health care provider.  Move your fingers often to reduce stiffness and swelling.  Raise (elevate) the injured area above the level of your heart while you are sitting or lying down.  If directed, put ice on the injured area. ? If you have a removable splint or brace, remove it as told by your health care provider. ? Put ice in a plastic bag. ? Place a towel between your skin and the bag. ? Leave the ice on for 20 minutes, 2-3 times a day.   Activity  Do exercises as told by your health care provider.  Return to your normal activities as told by your health care provider. Ask your health care provider what activities are safe for you.  Do not lift anything that is heavier than 10 lb (4.5 kg), or the limit that you are told, until your health care provider says that it is safe. General instructions  Do not use any products that contain nicotine or tobacco, such as cigarettes, e-cigarettes, and chewing tobacco. These can delay healing. If you need help quitting, ask your health care provider.  Keep all follow-up visits as told by your health care provider. This is important. How is this prevented?  Warm up and stretch before being active.  Cool down and stretch after being active.  Give your body time to rest between periods of activity.  Maintain physical fitness, including strength and flexibility. Contact a health care provider if:  Your symptoms do not improve in 4-6 weeks.  Your symptoms get worse.  Your splint or brace is causing pain, numbness, or tingling that is new. Get help right away if:  Your pain is severe.  You cannot move part of your hand or arm. Summary  Pronator syndrome involves pressure (compression) of the median nerve on the inner side of your forearm near the elbow.  This condition may be treated by wearing a splint or brace, taking medicines, doing exercises, or  having surgery.  Follow instructions about restricting activity, using a splint or brace, taking medicines, or resting.  Contact a health care provider if your symptoms do not get better after 4-6 weeks.  Get help right away if your pain is severe, or if you cannot move your hand or arm. This information is not intended to replace advice given to you by your health care provider. Make sure you discuss any questions you have with your health care provider. Document Revised: 02/21/2019 Document Reviewed: 02/22/2019 Elsevier Patient Education  [place].

## 2021-06-19 NOTE — Progress Notes (Signed)
I, Wendy Poet, LAT, ATC, am serving as scribe for Dr. [name].  Maria Barker is a 60 y.o. 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  She was last seen by Dr. Georgina Snell on 05/18/21 and was shown a HEP and advised to use Voltaren gel.  Since her last visit, pt reports that she feels like her elbow pain is worsening.  She states that she is having worsening numbness/tingling in her L hand.  She also has the numbness/tingling in her R hand too.  She locates her pain to her B post-med elbow that then radiates across her antecubital fossa.  She states that she has been doing her HEP a few times per week.  She also reports neck pain in addition to the B elbow pain and numbness/tingling in her B hands.  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   Exam:  BP 110/70 (BP Location: Left Arm, Patient Position: Sitting, Cuff Size: Normal)   Pulse (!) 59   Ht 4\' 11"  (1.499 m)   Wt 127 lb 6.4 oz (57.8 kg)   SpO2 97%   BMI 25.73 kg/m  General: Well Developed, well nourished, and in no acute distress.   MSK:  C-spine normal-appearing nontender cervical midline. Normal cervical motion. Negative Spurling's test. Upper extremity strength is intact. Reflexes are intact.  Elbows normal-appearing bilaterally.  Normal motion.  Tender palpation mild volar proximal forearms. Pain reproduced with resisted pronation bilaterally.  Negative Tinel's bilateral wrists.    Assessment and Plan: 60 y.o. female with bilateral volar elbow/forearm pain associated with bilateral hand paresthesia.  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  She has tried a home exercise program over the last month with little benefit.  Plan to treat by referring to hand PT and proceeding with nerve conduction study for further evaluation.  Additionally prescribed Celebrex.  She has had difficulty with ibuprofen in the past with mild GI upset.  Celebrex should be better tolerated.  Recheck after nerve conduction study.   PDMP not reviewed this encounter. Orders Placed This Encounter  Procedures   Ambulatory referral to Physical Therapy    Referral Priority:   Routine    Referral Type:   Physical Medicine    Referral Reason:   Specialty Services Required    Requested Specialty:   Physical Therapy    Number of Visits Requested:   1   Ambulatory referral to Neurology    Referral Priority:   Routine    Referral Type:   Consultation    Referral Reason:   Specialty Services Required    Requested Specialty:   Neurology    Number of Visits Requested:   1   NCV with EMG(electromyography)    Standing Status:   Future    Standing Expiration Date:   06/22/2022    Order Specific Question:   Where should this test be performed?    Answer:   [facility]   Meds ordered this encounter  Medications   celecoxib (CELEBREX) 100 MG capsule    Sig: Take 1 capsule (100 mg total) by mouth 2 (two) times daily as needed.  Dispense:  60 capsule    Refill:  1     Discussed warning signs or symptoms. Please see discharge instructions. Patient expresses understanding.   The above documentation has been reviewed and is accurate and complete [name], M.D.

## 2021-06-22 ENCOUNTER — Ambulatory Visit: Payer: Commercial Managed Care - PPO | Admitting: Family Medicine

## 2021-06-22 ENCOUNTER — Other Ambulatory Visit: Payer: Self-pay

## 2021-06-22 ENCOUNTER — Encounter: Payer: Self-pay | Admitting: Family Medicine

## 2021-06-22 ENCOUNTER — Ambulatory Visit: Payer: Self-pay

## 2021-06-22 VITALS — BP 110/70 | HR 59 | Ht 59.0 in | Wt 127.4 lb

## 2021-06-22 DIAGNOSIS — G5612 Other lesions of median nerve, left upper limb: Secondary | ICD-10-CM | POA: Diagnosis not present

## 2021-06-22 DIAGNOSIS — M25521 Pain in right elbow: Secondary | ICD-10-CM | POA: Diagnosis not present

## 2021-06-22 DIAGNOSIS — M25522 Pain in left elbow: Secondary | ICD-10-CM

## 2021-06-22 DIAGNOSIS — G5611 Other lesions of median nerve, right upper limb: Secondary | ICD-10-CM | POA: Diagnosis not present

## 2021-06-22 MED ORDER — CELECOXIB 100 MG PO CAPS: 100.0000 mg | ORAL_CAPSULE | Freq: Two times a day (BID) | ORAL | 1 refills | Status: DC | PRN

## 2021-06-22 NOTE — Patient Instructions (Addendum)
Thank you for coming in today.   I've referred you to Physical Therapy.  Let us know if you don't hear from them in one week.   Plan for nerve conduction study (Nerve test) with neurology. This test may take up to 1 month to schedule.   Physical therapy should happen soon.   Recheck with me about 1 week after the nerve test is done.   Let me know if you have a problem.   Try the celebrex twice daily as needed for pain.   Heating pad should help the back.

## 2021-06-23 ENCOUNTER — Other Ambulatory Visit: Payer: Self-pay | Admitting: Internal Medicine

## 2021-07-03 ENCOUNTER — Other Ambulatory Visit: Payer: Self-pay | Admitting: Internal Medicine

## 2021-08-05 ENCOUNTER — Other Ambulatory Visit: Payer: Self-pay | Admitting: Internal Medicine

## 2021-08-10 ENCOUNTER — Encounter: Payer: Self-pay | Admitting: Neurology

## 2021-08-13 DIAGNOSIS — M25521 Pain in right elbow: Secondary | ICD-10-CM | POA: Diagnosis not present

## 2021-08-13 DIAGNOSIS — M25621 Stiffness of right elbow, not elsewhere classified: Secondary | ICD-10-CM | POA: Diagnosis not present

## 2021-08-13 DIAGNOSIS — M25522 Pain in left elbow: Secondary | ICD-10-CM | POA: Diagnosis not present

## 2021-08-13 DIAGNOSIS — M25622 Stiffness of left elbow, not elsewhere classified: Secondary | ICD-10-CM | POA: Diagnosis not present

## 2021-08-18 DIAGNOSIS — M25521 Pain in right elbow: Secondary | ICD-10-CM | POA: Diagnosis not present

## 2021-08-18 DIAGNOSIS — M25621 Stiffness of right elbow, not elsewhere classified: Secondary | ICD-10-CM | POA: Diagnosis not present

## 2021-08-18 DIAGNOSIS — M25522 Pain in left elbow: Secondary | ICD-10-CM | POA: Diagnosis not present

## 2021-08-18 DIAGNOSIS — M25622 Stiffness of left elbow, not elsewhere classified: Secondary | ICD-10-CM | POA: Diagnosis not present

## 2021-08-20 DIAGNOSIS — M25622 Stiffness of left elbow, not elsewhere classified: Secondary | ICD-10-CM | POA: Diagnosis not present

## 2021-08-20 DIAGNOSIS — M25522 Pain in left elbow: Secondary | ICD-10-CM | POA: Diagnosis not present

## 2021-08-20 DIAGNOSIS — M25621 Stiffness of right elbow, not elsewhere classified: Secondary | ICD-10-CM | POA: Diagnosis not present

## 2021-08-20 DIAGNOSIS — M25521 Pain in right elbow: Secondary | ICD-10-CM | POA: Diagnosis not present

## 2021-08-25 DIAGNOSIS — M25522 Pain in left elbow: Secondary | ICD-10-CM | POA: Diagnosis not present

## 2021-08-25 DIAGNOSIS — M25622 Stiffness of left elbow, not elsewhere classified: Secondary | ICD-10-CM | POA: Diagnosis not present

## 2021-08-25 DIAGNOSIS — M25521 Pain in right elbow: Secondary | ICD-10-CM | POA: Diagnosis not present

## 2021-08-25 DIAGNOSIS — M25621 Stiffness of right elbow, not elsewhere classified: Secondary | ICD-10-CM | POA: Diagnosis not present

## 2021-09-03 DIAGNOSIS — M25622 Stiffness of left elbow, not elsewhere classified: Secondary | ICD-10-CM | POA: Diagnosis not present

## 2021-09-03 DIAGNOSIS — M25521 Pain in right elbow: Secondary | ICD-10-CM | POA: Diagnosis not present

## 2021-09-03 DIAGNOSIS — M25621 Stiffness of right elbow, not elsewhere classified: Secondary | ICD-10-CM | POA: Diagnosis not present

## 2021-09-03 DIAGNOSIS — M25522 Pain in left elbow: Secondary | ICD-10-CM | POA: Diagnosis not present

## 2021-09-08 DIAGNOSIS — M25622 Stiffness of left elbow, not elsewhere classified: Secondary | ICD-10-CM | POA: Diagnosis not present

## 2021-09-08 DIAGNOSIS — M25521 Pain in right elbow: Secondary | ICD-10-CM | POA: Diagnosis not present

## 2021-09-08 DIAGNOSIS — M25621 Stiffness of right elbow, not elsewhere classified: Secondary | ICD-10-CM | POA: Diagnosis not present

## 2021-09-08 DIAGNOSIS — M25522 Pain in left elbow: Secondary | ICD-10-CM | POA: Diagnosis not present

## 2021-09-10 DIAGNOSIS — M25622 Stiffness of left elbow, not elsewhere classified: Secondary | ICD-10-CM | POA: Diagnosis not present

## 2021-09-10 DIAGNOSIS — M25621 Stiffness of right elbow, not elsewhere classified: Secondary | ICD-10-CM | POA: Diagnosis not present

## 2021-09-10 DIAGNOSIS — M25522 Pain in left elbow: Secondary | ICD-10-CM | POA: Diagnosis not present

## 2021-09-10 DIAGNOSIS — M25521 Pain in right elbow: Secondary | ICD-10-CM | POA: Diagnosis not present

## 2021-09-15 ENCOUNTER — Encounter: Payer: Commercial Managed Care - PPO | Admitting: Neurology

## 2021-09-16 ENCOUNTER — Other Ambulatory Visit: Payer: Self-pay

## 2021-09-16 ENCOUNTER — Ambulatory Visit: Payer: BC Managed Care – PPO | Admitting: Neurology

## 2021-09-16 DIAGNOSIS — G5612 Other lesions of median nerve, left upper limb: Secondary | ICD-10-CM

## 2021-09-16 DIAGNOSIS — G5611 Other lesions of median nerve, right upper limb: Secondary | ICD-10-CM

## 2021-09-16 DIAGNOSIS — G5603 Carpal tunnel syndrome, bilateral upper limbs: Secondary | ICD-10-CM

## 2021-09-16 NOTE — Procedures (Signed)
[facility]  [place], [address]  [place], [place] [address] Tel: [phone] Fax:  [phone] Test Date:  09/16/2021  Patient: Maria Barker DOB: [birth year] Physician: Narda Amber, DO  Sex: Female Height: 4\' 11"  Ref Phys: [name], M.D.  ID#: [id number]   Technician:    Patient Complaints: This is a 60 year old female referred for evaluation of bilateral arm and hand pain.  NCV & EMG Findings: Extensive electrodiagnostic testing of the right upper extremity and additional studies of the left shows:  Bilateral median sensory responses show prolonged latency (R4.0, L4.4 ms).  Bilateral ulnar sensory responses are within normal limits. Bilateral median motor responses show prolonged latency (R4.4, L5.5 ms).  Bilateral ulnar motor responses are within normal limits. There is no evidence of active or chronic motor axonal loss changes affecting any of the tested muscles.  Motor unit configuration and recruitment pattern is within normal limits.  Impression: Bilateral median neuropathy at or distal to the wrist, consistent with a clinical diagnosis of carpal tunnel syndrome.  Overall, these findings are moderate in degree electrically and worse on the left.   ___________________________ Narda Amber, DO    Nerve Conduction Studies Anti Sensory Summary Table   Stim Site NR Peak (ms) Norm Peak (ms) P-T Amp (V) Norm P-T Amp  Left Median Anti Sensory (2nd Digit)  33C  Wrist    4.4 <3.6 26.4 >15  Right Median Anti Sensory (2nd Digit)  33C  Wrist    4.0 <3.6 26.6 >15  Left Ulnar Anti Sensory (5th Digit)  33C  Wrist    2.5 <3.1 45.3 >10  Right Ulnar Anti Sensory (5th Digit)  33C  Wrist    2.3 <3.1 49.6 >10   Motor Summary Table   Stim Site NR Onset (ms) Norm Onset (ms) O-P Amp (mV) Norm O-P Amp Site1 Site2 Delta-0 (ms) Dist (cm) Vel (m/s) Norm Vel (m/s)  Left Median Motor (Abd Poll Brev)  33C  Wrist    5.5 <4.0 11.1 >6 Elbow Wrist 5.0 26.0 52 >50  Elbow     10.5  10.5         Right Median Motor (Abd Poll Brev)  33C  Wrist    4.4 <4.0 8.6 >6 Elbow Wrist 4.5 26.0 58 >50  Elbow    8.9  8.5         Left Ulnar Motor (Abd Dig Minimi)  33C  Wrist    2.1 <3.1 7.7 >7 B Elbow Wrist 3.3 20.0 61 >50  B Elbow    5.4  7.4  A Elbow B Elbow 1.6 10.0 63 >50  A Elbow    7.0  7.4         Right Ulnar Motor (Abd Dig Minimi)  33C  Wrist    2.0 <3.1 9.3 >7 B Elbow Wrist 3.5 21.0 60 >50  B Elbow    5.5  9.3  A Elbow B Elbow 1.5 10.0 67 >50  A Elbow    7.0  8.5          EMG   Side Muscle Ins Act Fibs Psw Fasc Number Recrt Dur Dur. Amp Amp. Poly Poly. Comment  Right 1stDorInt Nml Nml Nml Nml Nml Nml Nml Nml Nml Nml Nml Nml N/A  Right Abd Poll Brev Nml Nml Nml Nml Nml Nml Nml Nml Nml Nml Nml Nml N/A  Right PronatorTeres Nml Nml Nml Nml Nml Nml Nml Nml Nml Nml Nml Nml N/A  Right Biceps Nml  Nml Nml Nml Nml Nml Nml Nml Nml Nml Nml Nml N/A  Right Triceps Nml Nml Nml Nml Nml Nml Nml Nml Nml Nml Nml Nml N/A  Right Deltoid Nml Nml Nml Nml Nml Nml Nml Nml Nml Nml Nml Nml N/A  Left 1stDorInt Nml Nml Nml Nml Nml Nml Nml Nml Nml Nml Nml Nml N/A  Left Abd Poll Brev Nml Nml Nml Nml Nml Nml Nml Nml Nml Nml Nml Nml N/A  Left PronatorTeres Nml Nml Nml Nml Nml Nml Nml Nml Nml Nml Nml Nml N/A  Left Biceps Nml Nml Nml Nml Nml Nml Nml Nml Nml Nml Nml Nml N/A  Left Triceps Nml Nml Nml Nml Nml Nml Nml Nml Nml Nml Nml Nml N/A  Left Deltoid Nml Nml Nml Nml Nml Nml Nml Nml Nml Nml Nml Nml N/A      Waveforms:

## 2021-09-16 NOTE — Progress Notes (Signed)
Nerve conduction study shows medium carpal tunnel syndrome.  Left is worse than right.  Return to clinic to go over the results and potentially proceed to injection.

## 2021-10-01 NOTE — Progress Notes (Signed)
I, Wendy Poet, LAT, ATC, am serving as scribe for Dr. [name].  Maria Barker is a 60 y.o. female who presents to [place] at [facility] today for f/u of B post-med elbow pain and B hand paresthesias and to review her B UE NCV/EMG.  She was last seen by Dr. Georgina Snell on 06/22/21 and noted worsening elbow pain and worsening hand paresthesias.  She was referred to hand PT at [facility] and to neurology for a B NCV/EMG test that she had on 09/16/21.  She was also prescribed Celebrex.  Today, pt reports that her symptoms are about the same.  She has been to approximately 4 PT sessions.  She is no longer taking the Celebrex.   She notes her bigger issue is numbness and paresthesia elbow pain left worse than right.  Pain is both medial and lateral lateral is worse than medial.  Diagnostic testing: B UE NCV/EMG- 09/16/21  Pertinent review of systems: No fevers or chills  Relevant historical information: Hypertension.   Exam:  BP 100/64 (BP Location: Right Arm, Patient Position: Sitting, Cuff Size: Normal)   Pulse (!) 59   Ht 4\' 11"  (1.499 m)   Wt 127 lb 3.2 oz (57.7 kg)   SpO2 96%   BMI 25.69 kg/m  General: Well Developed, well nourished, and in no acute distress.   MSK: Left elbow normal appearing. Tender palpation medial and lateral epicondyle. Pain with resisted wrist extension and flexion pronation and supination.   Right elbow normal-appearing Tender palpation minimally medial and lateral epicondyle. Mild pain with resisted wrist flexion extension pronation and supination.  Mild positive Tinel's and Phalen's test carpal tunnel.   Lab and Radiology Results  Procedure: Real-time Ultrasound Guided Injection of lateral epicondyle Device: Philips Affiniti 50G Images permanently stored and available for review in PACS Verbal informed consent obtained.  Discussed risks and benefits of procedure. Warned about infection bleeding damage to structures skin  hypopigmentation and fat atrophy among others. Patient expresses understanding and agreement Time-out conducted.   Noted no overlying erythema, induration, or other signs of local infection.   Skin prepped in a sterile fashion.   Local anesthesia: Topical Ethyl chloride.   With sterile technique and under real time ultrasound guidance: 40 mg of Kenalog and 2 mL of lidocaine injected into the lateral epicondyle. Fluid seen entering the epicondyle.   Completed without difficulty   Pain immediately resolved suggesting accurate placement of the medication.   Advised to call if fevers/chills, erythema, induration, drainage, or persistent bleeding.   Images permanently stored and available for review in the ultrasound unit.  Impression: Technically successful ultrasound guided injection.   Nerve conduction study September 16, 2021 Patient: Maria Barker DOB: 09/03/1961 Physician: Narda Amber, DO  Sex: Female Height: 4\' 11"  Ref Phys: [name], M.D.  ID#: [id number]     Technician:      Patient Complaints: This is a 60 year old female referred for evaluation of bilateral arm and hand pain.   NCV & EMG Findings: Extensive electrodiagnostic testing of the right upper extremity and additional studies of the left shows:  Bilateral median sensory responses show prolonged latency (R4.0, L4.4 ms).  Bilateral ulnar sensory responses are within normal limits. Bilateral median motor responses show prolonged latency (R4.4, L5.5 ms).  Bilateral ulnar motor responses are within normal limits. There is no evidence of active or chronic motor axonal loss changes affecting any of the tested muscles.  Motor unit configuration and recruitment pattern is within normal  limits.   Impression: Bilateral median neuropathy at or distal to the wrist, consistent with a clinical diagnosis of carpal tunnel syndrome.  Overall, these findings are moderate in degree electrically and worse on the left.           Assessment and Plan: 60 y.o. female with bilateral elbow pain left worse than right.  Pain is multifactorial thought to be both medial and lateral epicondylitis as well as pronator tendinopathy.  Additionally she does have carpal tunnel syndrome which is bothering her but to a lesser degree than the elbow pain.  She is had trials of hand PT with some benefit but incomplete benefit.  Plan to treat with trial of injection starting with the left lateral epicondyle today.  Additionally use wrist splints at night to treat carpal tunnel syndrome and recheck in 1 month.  Can proceed with other location injections in the future.   PDMP not reviewed this encounter. Orders Placed This Encounter  Procedures   [place] LIMITED JOINT SPACE STRUCTURES UP LEFT(NO LINKED CHARGES)    Order Specific Question:   Reason for Exam (SYMPTOM  OR DIAGNOSIS REQUIRED)    Answer:   lt elbow inj    Order Specific Question:   Preferred imaging location?    Answer:   [place]   No orders of the defined types were placed in this encounter.    Discussed warning signs or symptoms. Please see discharge instructions. Patient expresses understanding.   The above documentation has been reviewed and is accurate and complete [name], M.D.

## 2021-10-02 ENCOUNTER — Encounter: Payer: Self-pay | Admitting: Family Medicine

## 2021-10-02 ENCOUNTER — Ambulatory Visit: Payer: BC Managed Care – PPO | Admitting: Family Medicine

## 2021-10-02 ENCOUNTER — Ambulatory Visit: Payer: Self-pay

## 2021-10-02 ENCOUNTER — Other Ambulatory Visit: Payer: Self-pay

## 2021-10-02 VITALS — BP 100/64 | HR 59 | Ht 59.0 in | Wt 127.2 lb

## 2021-10-02 DIAGNOSIS — M25521 Pain in right elbow: Secondary | ICD-10-CM

## 2021-10-02 DIAGNOSIS — M25522 Pain in left elbow: Secondary | ICD-10-CM | POA: Diagnosis not present

## 2021-10-02 DIAGNOSIS — G5603 Carpal tunnel syndrome, bilateral upper limbs: Secondary | ICD-10-CM | POA: Diagnosis not present

## 2021-10-02 DIAGNOSIS — M7712 Lateral epicondylitis, left elbow: Secondary | ICD-10-CM | POA: Diagnosis not present

## 2021-10-02 NOTE — Patient Instructions (Signed)
Thank you for coming in today.   Carpal tunnel wrist brace  You received an injection today. Seek immediate medical attention if the joint becomes red, extremely painful, or is oozing fluid.   Recheck back in 1 month.

## 2021-10-21 ENCOUNTER — Other Ambulatory Visit: Payer: Self-pay | Admitting: Internal Medicine

## 2021-10-30 ENCOUNTER — Ambulatory Visit: Payer: BC Managed Care – PPO | Admitting: Family Medicine

## 2021-10-30 ENCOUNTER — Other Ambulatory Visit: Payer: Self-pay

## 2021-10-30 VITALS — BP 118/82 | HR 59 | Ht 59.0 in | Wt 126.4 lb

## 2021-10-30 DIAGNOSIS — M25521 Pain in right elbow: Secondary | ICD-10-CM | POA: Diagnosis not present

## 2021-10-30 DIAGNOSIS — M25522 Pain in left elbow: Secondary | ICD-10-CM

## 2021-10-30 NOTE — Patient Instructions (Addendum)
Thank you for coming in today.   Recheck back as needed

## 2021-10-30 NOTE — Progress Notes (Signed)
   I, Peterson Lombard, LAT, ATC acting as a scribe for [name], MD.  Maria Barker is a 60 y.o. female who presents to [place] at [facility] today for f/u of B post-med elbow pain and B hand paresthesias. Pt was last seen by Dr. Georgina Snell on 10/02/21 and was given a L lateral epicondyle steroid injection and advised to wear the wrist night splints. Today, pt reports much relief in the steroid injection, numbness/tingling has resolved pain and paresthesia in bilat elbows.   Dx testing:  09/16/21 B UE NCV/EMG  Pertinent review of systems: No fevers or chills  Relevant historical information: Hypertension   Exam:  BP 118/82   Pulse (!) 59   Ht 4\' 11"  (1.499 m)   Wt 126 lb 6.4 oz (57.3 kg)   SpO2 97%   BMI 25.53 kg/m  General: Well Developed, well nourished, and in no acute distress.   MSK: Left elbow normal.  Nontender. Right elbow normal.  Nontender. Elbow motion intact bilaterally.      Assessment and Plan: 60 y.o. female with significant improvement in bilateral elbow pain and paresthesias.  Patient had left lateral epicondyle injection last month which improved both the left and right arm pain.  Watchful waiting at this point.  Certainly could proceed with contralateral right injection if needed.  Recheck as needed.  Precautions reviewed.    Discussed warning signs or symptoms. Please see discharge instructions. Patient expresses understanding.   The above documentation has been reviewed and is accurate and complete [name], M.D.  Total encounter time 20 minutes including face-to-face time with the patient and, reviewing past medical record, and charting on the date of service.   Treatment plan and options and precautions.

## 2021-11-12 NOTE — Progress Notes (Signed)
Subjective:    Patient ID: Maria Barker, female    DOB: 1961-01-18, 60 y.o.   MRN: [id number]   This visit occurred during the SARS-CoV-2 public health emergency.  Safety protocols were in place, including screening questions prior to the visit, additional usage of staff PPE, and extensive cleaning of exam room while observing appropriate contact time as indicated for disinfecting solutions.    HPI She is here for a physical exam.   She is not sleeping well and has no energy, has headaches.   Last week she barely slept.  This week is she is sleeping.    Medications and allergies reviewed with patient and updated if appropriate.  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   Pain of both elbows 05/11/2021   Left leg pain 05/11/2021   Osteopenia - Dr Corinna Capra 11/27/2020   Left hip pain 05/08/2020   Epigastric pain 03/28/2019   Rash on lips 01/08/2019   Essential hypertension 01/08/2019   De Quervain's tenosynovitis, right 03/09/2018   Prediabetes 09/17/2017   Osteoarthritis of left knee 04/12/2017   B12 deficiency 08/13/2016   S/P bilateral hip replacements 08/13/2016   Family history of brain aneurysm 08/12/2015   Gastroesophageal reflux disease without esophagitis 08/12/2015   Hypothyroid 02/06/2015   Hyperlipidemia    Chronic insomnia 04/13/2013    Current Outpatient Medications on File Prior to Visit  Medication Sig Dispense Refill   atorvastatin (LIPITOR) 10 MG tablet TAKE 1 TABLET BY MOUTH ONCE DAILY(MUST SEE DOCTOR FOR REFILLS ANNUAL APPT IN NOV) 90 tablet 0   Calcium Carbonate-Vitamin D (CALCIUM-VITAMIN D3 PO) Take 1 each by mouth 2 (two) times daily.     estradiol (ESTRACE) 0.1 MG/GM vaginal cream Place vaginally.     EUTHYROX 50 MCG tablet TAKE 1 TABLET BY MOUTH ONCE DAILY 6  DAYS  A  WEEK 90 tablet 0   olmesartan (BENICAR) 5 MG tablet TAKE 1 TABLET  BY MOUTH ONCE DAILY . APPOINTMENT REQUIRED FOR FUTURE REFILLS . 90 tablet 0   Omega-3 1000 MG CAPS Take 2,000 mg by mouth.     omeprazole (PRILOSEC) 20 MG capsule TAKE 1 CAPSULE BY MOUTH ONCE DAILY 30  MINUTES  PRIOR  TO  A  MEAL 30 capsule 0   vitamin B-12 (CYANOCOBALAMIN) 1000 MCG tablet Take 1,000 mcg by mouth daily.     zolpidem (AMBIEN) 5 MG tablet TAKE 1 TABLET BY MOUTH AT BEDTIME AS NEEDED FOR SLEEP 30 tablet 0   Cetirizine HCl 10 MG CAPS Take 1 capsule (10 mg total) by mouth daily for 10 days. 10 capsule 0   No current facility-administered medications on file prior to visit.    Past Medical History:  Diagnosis Date   Arthritis    R THR 2011   Back pain    Frequent headaches    Hyperlipidemia LDL goal < 130    Hypertension    Hypothyroid 02/06/2015   Dx 01/2015: TSH 5.4   Insomnia    Left hip pain    Pain of left thigh     Past Surgical History:  Procedure Laterality Date   TOTAL HIP ARTHROPLASTY Right 03/31/10   olin   TOTAL HIP ARTHROPLASTY Left 03/08/2016   Procedure: TOTAL HIP ARTHROPLASTY;  Surgeon: Frederik Pear, MD;  Location: [place];  Service: Orthopedics;  Laterality: Left;   TUBAL LIGATION  UPPER GASTROINTESTINAL ENDOSCOPY      Social History   Socioeconomic History   Marital status: Married    Spouse name: Not on file   Number of children: 5   Years of education: Not on file   Highest education level: Not on file  Occupational History   Occupation: Glass blower/designer    Employer: [employer]  Tobacco Use   Smoking status: Never   Smokeless tobacco: Never  Substance and Sexual Activity   Alcohol use: No   Drug use: No   Sexual activity: Not on file  Other Topics Concern   Not on file  Social History Narrative   Not on file   Social Determinants of Health   Financial Resource Strain: Not on file  Food Insecurity: Not on file  Transportation Needs: Not on file  Physical Activity: Not on file  Stress: Not on file  Social Connections: Not on  file    Family History  Problem Relation Age of Onset   CVA Mother    Hypertension Mother     Review of Systems  Constitutional:  Positive for fatigue. Negative for fever.  Eyes:  Negative for visual disturbance.  Respiratory:  Negative for cough, shortness of breath and wheezing.   Cardiovascular:  Positive for palpitations (occ - when very tired) and leg swelling. Negative for chest pain.  Gastrointestinal:  Negative for abdominal pain, blood in stool, constipation, diarrhea and nausea.       Gerd occ  Genitourinary:  Negative for dysuria.  Musculoskeletal:  Positive for arthralgias and back pain.  Skin:  Negative for rash.  Neurological:  Positive for headaches. Negative for light-headedness.  Psychiatric/Behavioral:  Positive for sleep disturbance. Negative for dysphoric mood. The patient is not nervous/anxious.       Objective:   Vitals:   11/13/21 0754  BP: 118/62  Pulse: 94  Temp: 97.9 F (36.6 C)  SpO2: 96%   Filed Weights   11/13/21 0754  Weight: 127 lb (57.6 kg)   Body mass index is 25.65 kg/m.  BP Readings from Last 3 Encounters:  11/13/21 118/62  10/30/21 118/82  10/02/21 100/64    Wt Readings from Last 3 Encounters:  11/13/21 127 lb (57.6 kg)  10/30/21 126 lb 6.4 oz (57.3 kg)  10/02/21 127 lb 3.2 oz (57.7 kg)    Depression screen [facility] 2/9 11/13/2021 11/10/2020  Decreased Interest 0 0  Down, Depressed, Hopeless 0 0  PHQ - 2 Score 0 0  Altered sleeping - 2  Tired, decreased energy - 1  Change in appetite - 0  Feeling bad or failure about yourself  - 0  Trouble concentrating - 0  Moving slowly or fidgety/restless - 0  Suicidal thoughts - 0  PHQ-9 Score - 3  Difficult doing work/chores - Not difficult at all     No flowsheet data found.     Physical Exam Constitutional: She appears well-developed and well-nourished. No distress.  HENT:  Head: Normocephalic and atraumatic.  Right Ear: External ear normal. Normal ear canal and TM Left  Ear: External ear normal.  Normal ear canal and TM Mouth/Throat: Oropharynx is clear and moist.  Eyes: Conjunctivae and EOM are normal.  Neck: Neck supple. No tracheal deviation present. No thyromegaly present.  No carotid bruit  Cardiovascular: Normal rate, regular rhythm and normal heart sounds.   No murmur heard.  No edema. Pulmonary/Chest: Effort normal and breath sounds normal. No respiratory distress. She has no wheezes. She has no rales.  Breast: deferred   Abdominal: Soft. She exhibits no distension. There is no tenderness.  Lymphadenopathy: She has no cervical adenopathy.  Skin: Skin is warm and dry. She is not diaphoretic.  Psychiatric: She has a normal mood and affect. Her behavior is normal.     Lab Results  Component Value Date   WBC 7.9 11/10/2020   HGB 12.2 11/10/2020   HCT 37.1 11/10/2020   PLT 312.0 11/10/2020   GLUCOSE 92 05/11/2021   CHOL 166 11/10/2020   TRIG 173.0 (H) 11/10/2020   HDL 58.30 11/10/2020   LDLDIRECT 93.7 12/21/2013   LDLCALC 73 11/10/2020   ALT 24 05/11/2021   AST 26 05/11/2021   NA 136 05/11/2021   K 4.0 05/11/2021   CL 105 05/11/2021   CREATININE 0.60 05/11/2021   BUN 18 05/11/2021   CO2 25 05/11/2021   TSH 1.45 05/11/2021   INR 1.18 02/27/2016   HGBA1C 6.1 05/11/2021         Assessment & Plan:   Physical exam: Screening blood work  ordered Exercise  active at work - no regimented exercise Weight  normal  Substance abuse  none   Reviewed recommended immunizations.   Health Maintenance  Topic Date Due   Zoster Vaccines- Shingrix (1 of 2) Never done   COVID-19 Vaccine (3 - Booster for Pfizer series) 06/09/2020   PAP SMEAR-Modifier  05/12/2021   INFLUENZA VACCINE  07/27/2021   MAMMOGRAM  09/10/2022   COLONOSCOPY (Pts 45-75yrs Insurance coverage will need to be confirmed)  09/05/2023   DEXA SCAN  09/11/2023   TETANUS/TDAP  02/04/2025   Hepatitis C Screening  Completed   HIV Screening  Completed   Pneumococcal Vaccine  64-75 Years old  Aged Out   HPV VACCINES  Aged Out          See Problem List for Assessment and Plan of chronic medical problems.

## 2021-11-12 NOTE — Patient Instructions (Addendum)
Flu immunization administered today.     Blood work was ordered.     Medications changes include :   stop ambien and try sonata for your sleep.   Your prescription(s) have been submitted to your pharmacy. Please take as directed and contact our office if you believe you are having problem(s) with the medication(s).    Please followup in 6 months   Health Maintenance, Female Adopting a healthy lifestyle and getting preventive care are important in promoting health and wellness. Ask your health care provider about: The right schedule for you to have regular tests and exams. Things you can do on your own to prevent diseases and keep yourself healthy. What should I know about diet, weight, and exercise? Eat a healthy diet  Eat a diet that includes plenty of vegetables, fruits, low-fat dairy products, and lean protein. Do not eat a lot of foods that are high in solid fats, added sugars, or sodium. Maintain a healthy weight Body mass index (BMI) is used to identify weight problems. It estimates body fat based on height and weight. Your health care provider can help determine your BMI and help you achieve or maintain a healthy weight. Get regular exercise Get regular exercise. This is one of the most important things you can do for your health. Most adults should: Exercise for at least 150 minutes each week. The exercise should increase your heart rate and make you sweat (moderate-intensity exercise). Do strengthening exercises at least twice a week. This is in addition to the moderate-intensity exercise. Spend less time sitting. Even light physical activity can be beneficial. Watch cholesterol and blood lipids Have your blood tested for lipids and cholesterol at 58 years of age, then have this test every 5 years. Have your cholesterol levels checked more often if: Your lipid or cholesterol levels are high. You are older than 58 years of age. You are at high risk for heart  disease. What should I know about cancer screening? Depending on your health history and family history, you may need to have cancer screening at various ages. This may include screening for: Breast cancer. Cervical cancer. Colorectal cancer. Skin cancer. Lung cancer. What should I know about heart disease, diabetes, and high blood pressure? Blood pressure and heart disease High blood pressure causes heart disease and increases the risk of stroke. This is more likely to develop in people who have high blood pressure readings or are overweight. Have your blood pressure checked: Every 3-5 years if you are 36-36 years of age. Every year if you are 82 years old or older. Diabetes Have regular diabetes screenings. This checks your fasting blood sugar level. Have the screening done: Once every three years after age 24 if you are at a normal weight and have a low risk for diabetes. More often and at a younger age if you are overweight or have a high risk for diabetes. What should I know about preventing infection? Hepatitis B If you have a higher risk for hepatitis B, you should be screened for this virus. Talk with your health care provider to find out if you are at risk for hepatitis B infection. Hepatitis C Testing is recommended for: Everyone born from [birth year] through 1963. Anyone with known risk factors for hepatitis C. Sexually transmitted infections (STIs) Get screened for STIs, including gonorrhea and chlamydia, if: You are sexually active and are younger than 58 years of age. You are older than 58 years of age and your health care provider  tells you that you are at risk for this type of infection. Your sexual activity has changed since you were last screened, and you are at increased risk for chlamydia or gonorrhea. Ask your health care provider if you are at risk. Ask your health care provider about whether you are at high risk for HIV. Your health care provider may recommend a  prescription medicine to help prevent HIV infection. If you choose to take medicine to prevent HIV, you should first get tested for HIV. You should then be tested every 3 months for as long as you are taking the medicine. Pregnancy If you are about to stop having your period (premenopausal) and you may become pregnant, seek counseling before you get pregnant. Take 400 to 800 micrograms (mcg) of folic acid every day if you become pregnant. Ask for birth control (contraception) if you want to prevent pregnancy. Osteoporosis and menopause Osteoporosis is a disease in which the bones lose minerals and strength with aging. This can result in bone fractures. If you are 69 years old or older, or if you are at risk for osteoporosis and fractures, ask your health care provider if you should: Be screened for bone loss. Take a calcium or vitamin D supplement to lower your risk of fractures. Be given hormone replacement therapy (HRT) to treat symptoms of menopause. Follow these instructions at home: Alcohol use Do not drink alcohol if: Your health care provider tells you not to drink. You are pregnant, may be pregnant, or are planning to become pregnant. If you drink alcohol: Limit how much you have to: 0-1 drink a day. Know how much alcohol is in your drink. In the [place] one drink equals one 12 oz bottle of beer (355 mL), one 5 oz glass of wine (148 mL), or one 1 oz glass of hard liquor (44 mL). Lifestyle Do not use any products that contain nicotine or tobacco. These products include cigarettes, chewing tobacco, and vaping devices, such as e-cigarettes. If you need help quitting, ask your health care provider. Do not use street drugs. Do not share needles. Ask your health care provider for help if you need support or information about quitting drugs. General instructions Schedule regular health, dental, and eye exams. Stay current with your vaccines. Tell your health care provider if: You often  feel depressed. You have ever been abused or do not feel safe at home. Summary Adopting a healthy lifestyle and getting preventive care are important in promoting health and wellness. Follow your health care provider's instructions about healthy diet, exercising, and getting tested or screened for diseases. Follow your health care provider's instructions on monitoring your cholesterol and blood pressure. This information is not intended to replace advice given to you by your health care provider. Make sure you discuss any questions you have with your health care provider. Document Revised: 05/04/2021 Document Reviewed: 05/04/2021 Elsevier Patient Education  [place].

## 2021-11-13 ENCOUNTER — Encounter: Payer: Self-pay | Admitting: Internal Medicine

## 2021-11-13 ENCOUNTER — Ambulatory Visit (INDEPENDENT_AMBULATORY_CARE_PROVIDER_SITE_OTHER): Payer: BC Managed Care – PPO | Admitting: Internal Medicine

## 2021-11-13 ENCOUNTER — Other Ambulatory Visit: Payer: Self-pay

## 2021-11-13 VITALS — BP 118/62 | HR 94 | Temp 97.9°F | Ht 59.0 in | Wt 127.0 lb

## 2021-11-13 DIAGNOSIS — F5104 Psychophysiologic insomnia: Secondary | ICD-10-CM

## 2021-11-13 DIAGNOSIS — I10 Essential (primary) hypertension: Secondary | ICD-10-CM | POA: Diagnosis not present

## 2021-11-13 DIAGNOSIS — M8588 Other specified disorders of bone density and structure, other site: Secondary | ICD-10-CM | POA: Diagnosis not present

## 2021-11-13 DIAGNOSIS — E538 Deficiency of other specified B group vitamins: Secondary | ICD-10-CM | POA: Diagnosis not present

## 2021-11-13 DIAGNOSIS — E782 Mixed hyperlipidemia: Secondary | ICD-10-CM | POA: Diagnosis not present

## 2021-11-13 DIAGNOSIS — K219 Gastro-esophageal reflux disease without esophagitis: Secondary | ICD-10-CM

## 2021-11-13 DIAGNOSIS — R7303 Prediabetes: Secondary | ICD-10-CM | POA: Diagnosis not present

## 2021-11-13 DIAGNOSIS — E038 Other specified hypothyroidism: Secondary | ICD-10-CM

## 2021-11-13 DIAGNOSIS — Z0000 Encounter for general adult medical examination without abnormal findings: Secondary | ICD-10-CM

## 2021-11-13 LAB — COMPREHENSIVE METABOLIC PANEL
ALT: 31 U/L (ref 0–35)
AST: 28 U/L (ref 0–37)
Albumin: 4.1 g/dL (ref 3.5–5.2)
Alkaline Phosphatase: 54 U/L (ref 39–117)
BUN: 16 mg/dL (ref 6–23)
CO2: 26 mEq/L (ref 19–32)
Calcium: 9.2 mg/dL (ref 8.4–10.5)
Chloride: 99 mEq/L (ref 96–112)
Creatinine, Ser: 0.6 mg/dL (ref 0.40–1.20)
GFR: 98.94 mL/min (ref >60.00)
Glucose, Bld: 84 mg/dL (ref 70–99)
Potassium: 4 mEq/L (ref 3.5–5.1)
Sodium: 132 mEq/L — ABNORMAL LOW (ref 135–145)
Total Bilirubin: 0.5 mg/dL (ref 0.2–1.2)
Total Protein: 7.8 g/dL (ref 6.0–8.3)

## 2021-11-13 LAB — LIPID PANEL
Cholesterol: 185 mg/dL (ref 0–200)
HDL: 58.1 mg/dL (ref >39.00)
LDL Cholesterol: 101 mg/dL — ABNORMAL HIGH (ref 0–99)
NonHDL: 126.51
Total CHOL/HDL Ratio: 3
Triglycerides: 129 mg/dL (ref 0.0–149.0)
VLDL: 25.8 mg/dL (ref 0.0–40.0)

## 2021-11-13 LAB — VITAMIN D 25 HYDROXY (VIT D DEFICIENCY, FRACTURES): VITD: 23.92 ng/mL — ABNORMAL LOW (ref 30.00–100.00)

## 2021-11-13 LAB — CBC WITH DIFFERENTIAL/PLATELET
Basophils Absolute: 0.1 10*3/uL (ref 0.0–0.1)
Basophils Relative: 1.1 % (ref 0.0–3.0)
Eosinophils Absolute: 1.2 10*3/uL — ABNORMAL HIGH (ref 0.0–0.7)
Eosinophils Relative: 16.9 % — ABNORMAL HIGH (ref 0.0–5.0)
HCT: 36.1 % (ref 36.0–46.0)
Hemoglobin: 12 g/dL (ref 12.0–15.0)
Lymphocytes Relative: 25.3 % (ref 12.0–46.0)
Lymphs Abs: 1.8 10*3/uL (ref 0.7–4.0)
MCHC: 33.3 g/dL (ref 30.0–36.0)
MCV: 84.4 fl (ref 78.0–100.0)
Monocytes Absolute: 0.6 10*3/uL (ref 0.1–1.0)
Monocytes Relative: 8.3 % (ref 3.0–12.0)
Neutro Abs: 3.5 10*3/uL (ref 1.4–7.7)
Neutrophils Relative %: 48.4 % (ref 43.0–77.0)
Platelets: 322 10*3/uL (ref 150.0–400.0)
RBC: 4.27 Mil/uL (ref 3.87–5.11)
RDW: 13.7 % (ref 11.5–15.5)
WBC: 7.2 10*3/uL (ref 4.0–10.5)

## 2021-11-13 LAB — TSH: TSH: 4.13 u[IU]/mL (ref 0.35–5.50)

## 2021-11-13 LAB — VITAMIN B12: Vitamin B-12: 1514 pg/mL — ABNORMAL HIGH (ref 211–911)

## 2021-11-13 LAB — HEMOGLOBIN A1C: Hgb A1c MFr Bld: 6 % (ref 4.6–6.5)

## 2021-11-13 MED ORDER — LEVOTHYROXINE SODIUM 50 MCG PO TABS: ORAL_TABLET | ORAL | 2 refills | Status: DC

## 2021-11-13 MED ORDER — OLMESARTAN MEDOXOMIL 5 MG PO TABS: ORAL_TABLET | ORAL | 1 refills | Status: DC

## 2021-11-13 MED ORDER — ATORVASTATIN CALCIUM 10 MG PO TABS: ORAL_TABLET | ORAL | 1 refills | Status: DC

## 2021-11-13 MED ORDER — ZALEPLON 10 MG PO CAPS: 10.0000 mg | ORAL_CAPSULE | Freq: Every evening | ORAL | 0 refills | Status: DC | PRN

## 2021-11-13 NOTE — Assessment & Plan Note (Signed)
Chronic Blood pressure well controlled CMP Continue Benicar 5 mg daily 

## 2021-11-13 NOTE — Assessment & Plan Note (Signed)
Chronic DEXA up-to-date Managed by GYN-Dr. Corinna Capra Stressed regular exercise Continue calcium and vitamin D Check vitamin D level

## 2021-11-13 NOTE — Assessment & Plan Note (Signed)
Chronic Taking vitamin D daily Check B12 level

## 2021-11-13 NOTE — Assessment & Plan Note (Addendum)
chornic Not sleeping well and causing fatigue and headaches Not controlled several medication have not been effective Stop ambien Trial of sonata 10 mg nightly

## 2021-11-13 NOTE — Assessment & Plan Note (Signed)
Chronic Check a1c Low sugar / carb diet Stressed regular exercise  

## 2021-11-13 NOTE — Assessment & Plan Note (Signed)
Chronic  Clinically euthyroid Currently taking Euthyrox 50 mcg 6 days a week Check tsh  Titrate med dose if needed

## 2021-11-13 NOTE — Assessment & Plan Note (Signed)
Chronic Regular exercise and healthy diet encouraged Check lipid panel  Continue atorvastatin 10 mg daily 

## 2021-11-13 NOTE — Assessment & Plan Note (Addendum)
Chronic, occasional GERD controlled Continue omeprazole 20 mg daily prn

## 2021-11-22 ENCOUNTER — Encounter: Payer: Self-pay | Admitting: Internal Medicine

## 2021-11-22 DIAGNOSIS — D721 Eosinophilia, unspecified: Secondary | ICD-10-CM

## 2021-11-25 ENCOUNTER — Telehealth: Payer: Self-pay | Admitting: Hematology

## 2021-11-25 NOTE — Telephone Encounter (Signed)
Scheduled appt per 11/30 referral. Pt is aware of appt date and time.

## 2021-12-02 DIAGNOSIS — H9201 Otalgia, right ear: Secondary | ICD-10-CM | POA: Diagnosis not present

## 2021-12-02 DIAGNOSIS — E039 Hypothyroidism, unspecified: Secondary | ICD-10-CM | POA: Diagnosis not present

## 2021-12-02 DIAGNOSIS — R5383 Other fatigue: Secondary | ICD-10-CM | POA: Diagnosis not present

## 2021-12-02 DIAGNOSIS — G4700 Insomnia, unspecified: Secondary | ICD-10-CM | POA: Diagnosis not present

## 2021-12-04 ENCOUNTER — Other Ambulatory Visit: Payer: Self-pay

## 2021-12-04 ENCOUNTER — Emergency Department (HOSPITAL_COMMUNITY)
Admission: EM | Admit: 2021-12-04 | Discharge: 2021-12-04 | Disposition: A | Discharge status: Home / Self Care | Payer: BC Managed Care – PPO | Attending: Emergency Medicine | Admitting: Emergency Medicine

## 2021-12-04 ENCOUNTER — Encounter (HOSPITAL_COMMUNITY): Payer: Self-pay | Admitting: Emergency Medicine

## 2021-12-04 DIAGNOSIS — I10 Essential (primary) hypertension: Secondary | ICD-10-CM | POA: Insufficient documentation

## 2021-12-04 DIAGNOSIS — D7219 Other eosinophilia: Secondary | ICD-10-CM | POA: Diagnosis not present

## 2021-12-04 DIAGNOSIS — R519 Headache, unspecified: Secondary | ICD-10-CM | POA: Diagnosis not present

## 2021-12-04 DIAGNOSIS — E039 Hypothyroidism, unspecified: Secondary | ICD-10-CM | POA: Diagnosis not present

## 2021-12-04 DIAGNOSIS — D721 Eosinophilia, unspecified: Secondary | ICD-10-CM

## 2021-12-04 DIAGNOSIS — Z20822 Contact with and (suspected) exposure to covid-19: Secondary | ICD-10-CM | POA: Insufficient documentation

## 2021-12-04 DIAGNOSIS — R55 Syncope and collapse: Secondary | ICD-10-CM | POA: Diagnosis not present

## 2021-12-04 LAB — CBC WITH DIFFERENTIAL/PLATELET
Abs Immature Granulocytes: 0.02 10*3/uL (ref 0.00–0.07)
Basophils Absolute: 0.1 10*3/uL (ref 0.0–0.1)
Basophils Relative: 1 %
Eosinophils Absolute: 1 10*3/uL — ABNORMAL HIGH (ref 0.0–0.5)
Eosinophils Relative: 15 %
HCT: 39.5 % (ref 36.0–46.0)
Hemoglobin: 12.4 g/dL (ref 12.0–15.0)
Immature Granulocytes: 0 %
Lymphocytes Relative: 27 %
Lymphs Abs: 1.9 10*3/uL (ref 0.7–4.0)
MCH: 27.4 pg (ref 26.0–34.0)
MCHC: 31.4 g/dL (ref 30.0–36.0)
MCV: 87.2 fL (ref 80.0–100.0)
Monocytes Absolute: 0.7 10*3/uL (ref 0.1–1.0)
Monocytes Relative: 10 %
Neutro Abs: 3.4 10*3/uL (ref 1.7–7.7)
Neutrophils Relative %: 47 %
Platelets: 384 10*3/uL (ref 150–400)
RBC: 4.53 MIL/uL (ref 3.87–5.11)
RDW: 14.1 % (ref 11.5–15.5)
WBC: 7.1 10*3/uL (ref 4.0–10.5)
nRBC: 0 % (ref 0.0–0.2)

## 2021-12-04 LAB — T4, FREE: Free T4: 1.13 ng/dL — ABNORMAL HIGH (ref 0.61–1.12)

## 2021-12-04 LAB — BASIC METABOLIC PANEL
Anion gap: 7 (ref 5–15)
BUN: 13 mg/dL (ref 6–20)
CO2: 22 mmol/L (ref 22–32)
Calcium: 9.1 mg/dL (ref 8.9–10.3)
Chloride: 103 mmol/L (ref 98–111)
Creatinine, Ser: 0.65 mg/dL (ref 0.44–1.00)
GFR, Estimated: >60 mL/min (ref >60)
Glucose, Bld: 101 mg/dL — ABNORMAL HIGH (ref 70–99)
Potassium: 3.7 mmol/L (ref 3.5–5.1)
Sodium: 132 mmol/L — ABNORMAL LOW (ref 135–145)

## 2021-12-04 LAB — TSH: TSH: 1.029 u[IU]/mL (ref 0.350–4.500)

## 2021-12-04 LAB — CBG MONITORING, ED: Glucose-Capillary: 92 mg/dL (ref 70–99)

## 2021-12-04 LAB — RESP PANEL BY RT-PCR (FLU A&B, COVID) ARPGX2
Influenza A by PCR: NEGATIVE
Influenza B by PCR: NEGATIVE
SARS Coronavirus 2 by RT PCR: NEGATIVE

## 2021-12-04 NOTE — ED Provider Notes (Signed)
Emergency Medicine Provider Triage Evaluation Note  Maria Barker , a 60 y.o. female  was evaluated in triage.  Pt complains of dizziness and fatigue x5 weeks. Patient states that she has had this issue on and off for 5 years. Patient has had extensive workup at PCP. No cause identified so far. Patient states decreased oral intake over last 4 weeks and had near syncopal episode this morning after going from sitting to standing position.   Review of Systems  Positive: Headaches, dizziness, fatigue Negative: SOB, CP, blood in stool, abdominal pain  Physical Exam  BP (!) 161/88 (BP Location: Left Arm)   Pulse 63   Temp 98.4 F (36.9 C) (Oral)   Resp 14   SpO2 100%  Gen:   Awake, no distress   Resp:  Normal effort  MSK:   Moves extremities without difficulty  Other:  CBG 92  Medical Decision Making  Medically screening exam initiated at 9:27 AM.  Appropriate orders placed.  Maria Barker was informed that the remainder of the evaluation will be completed by another provider, this initial triage assessment does not replace that evaluation, and the importance of remaining in the ED until their evaluation is complete.     [name], PA-C 12/04/21 0930    Wyvonnia Dusky, MD 12/04/21 1029

## 2021-12-04 NOTE — Discharge Instructions (Signed)
Please follow-up with a hematologist next week as scheduled.  Your COVID and flu test were negative.  I strongly recommend he take a few days to rest from work, keep yourself hydrated with water.  Please take your time when standing up.

## 2021-12-04 NOTE — ED Provider Notes (Signed)
[facility] EMERGENCY DEPARTMENT Provider Note   CSN: [id number] Arrival date & time: 12/04/21  1761     History No chief complaint on file.   Maria Barker is a 60 y.o. female with history of hypertension, hypothyroidism, hyperlipidemia, presenting to the emergency department with several complaints.  Supplemental history is provided by the patient's son at bedside.  He reports that she has been feeling unwell for the past 5 weeks.  He reports she has had fatigue, loss of appetite, the food does not taste right.  She does need to make an effort to eat.  She denies any vomiting or diarrhea.  She has had persistent intermittent headaches, but does suffer from these frequently.  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  The ringing has resolved and her hearing is back to normal.  Today her son states that when the patient stood up from the toilet she became very lightheaded and had episode of syncope or near syncope.  This is what prompted their visit to the ER.  She has no known history of anemia and denies bloody or black or tarry stools.  She denies any chest pain or pressure.  She is compliant with all of her medications.  She denies any recent fevers, chills, abdominal pain, sore throat.  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   Pain of both elbows 05/11/2021   Left leg pain 05/11/2021   Osteopenia - Dr Corinna Capra 11/27/2020   Left hip pain 05/08/2020   Epigastric pain 03/28/2019   Rash on  lips 01/08/2019   Essential hypertension 01/08/2019   De Quervain's tenosynovitis, right 03/09/2018   Prediabetes 09/17/2017   Osteoarthritis of left knee 04/12/2017   B12 deficiency 08/13/2016   S/P bilateral hip replacements 08/13/2016   Family history of brain aneurysm 08/12/2015   Gastroesophageal reflux disease without esophagitis 08/12/2015   Hypothyroid 02/06/2015   Hyperlipidemia    Chronic insomnia 04/13/2013    Past Surgical History:  Procedure Laterality Date   TOTAL HIP ARTHROPLASTY Right 03/31/10   olin   TOTAL HIP ARTHROPLASTY Left 03/08/2016   Procedure: TOTAL HIP ARTHROPLASTY;  Surgeon: Frederik Pear, MD;  Location: [facility];  Service: Orthopedics;  Laterality: Left;   TUBAL LIGATION     UPPER GASTROINTESTINAL ENDOSCOPY       OB History   No obstetric history on file.     Family History  Problem Relation Age of Onset   CVA Mother    Hypertension Mother     Social History   Tobacco Use   Smoking status: Never   Smokeless tobacco: Never  Substance Use Topics   Alcohol use: No   Drug use: No    Home Medications Prior to Admission medications   Medication Sig Start Date End Date Taking? Authorizing Provider  atorvastatin (LIPITOR) 10 MG tablet  TAKE 1 TABLET BY MOUTH ONCE DAILY(MUST SEE DOCTOR FOR REFILLS ANNUAL APPT IN NOV) 11/13/21   Binnie Rail, MD  Calcium Carbonate-Vitamin D (CALCIUM-VITAMIN D3 PO) Take 1 each by mouth 2 (two) times daily.    [provider]  Cetirizine HCl 10 MG CAPS Take 1 capsule (10 mg total) by mouth daily for 10 days. 01/07/21 01/17/21  Wieters, Hallie C, PA-C  estradiol (ESTRACE) 0.1 MG/GM vaginal cream Place vaginally. 10/26/20   [provider]  levothyroxine (EUTHYROX) 50 MCG tablet TAKE 1 TABLET BY MOUTH ONCE DAILY 6  DAYS  A  WEEK 11/13/21   Burns, Claudina Lick, MD  olmesartan (BENICAR) 5 MG tablet TAKE 1 TABLET BY MOUTH ONCE DAILY . 11/13/21   Binnie Rail, MD  Omega-3 1000 MG CAPS Take 2,000 mg by mouth.     [provider]  omeprazole (PRILOSEC) 20 MG capsule TAKE 1 CAPSULE BY MOUTH ONCE DAILY 30  MINUTES  PRIOR  TO  A  MEAL 06/23/21   Burns, Claudina Lick, MD  vitamin B-12 (CYANOCOBALAMIN) 1000 MCG tablet Take 1,000 mcg by mouth daily.    [provider]  zaleplon (SONATA) 10 MG capsule Take 1 capsule (10 mg total) by mouth at bedtime as needed for sleep. 11/13/21   Binnie Rail, MD    Allergies    Asa [aspirin] and Ibuprofen  Review of Systems   Review of Systems  Constitutional:  Positive for appetite change and fatigue. Negative for chills and fever.  HENT:  Negative for ear discharge, facial swelling and sore throat.   Eyes:  Negative for pain and visual disturbance.  Respiratory:  Negative for cough and shortness of breath.   Cardiovascular:  Negative for chest pain and palpitations.  Gastrointestinal:  Negative for abdominal pain, nausea and vomiting.  Genitourinary:  Negative for dysuria and hematuria.  Musculoskeletal:  Negative for arthralgias and back pain.  Skin:  Negative for color change and rash.  Neurological:  Positive for light-headedness and headaches. Negative for seizures and speech difficulty.  All other systems reviewed and are negative.  Physical Exam Updated Vital Signs BP (!) 133/91   Pulse 60   Temp 98.2 F (36.8 C) (Oral)   Resp 19   SpO2 98%   Physical Exam Constitutional:      General: She is not in acute distress. HENT:     Head: Normocephalic and atraumatic.     Right Ear: Tympanic membrane, ear canal and external ear normal. There is no impacted cerumen.     Left Ear: Tympanic membrane, ear canal and external ear normal. There is no impacted cerumen.  Eyes:     Conjunctiva/sclera: Conjunctivae normal.     Pupils: Pupils are equal, round, and reactive to light.  Cardiovascular:     Rate and Rhythm: Normal rate and regular rhythm.     Pulses: Normal pulses.  Pulmonary:     Effort: Pulmonary effort is normal. No respiratory  distress.  Abdominal:     General: There is no distension.     Tenderness: There is no abdominal tenderness.  Skin:    General: Skin is warm and dry.  Neurological:     General: No focal deficit present.     Mental Status: She is alert and oriented to person, place, and time. Mental status is at baseline.  Psychiatric:        Mood and Affect: Mood normal.        Behavior: Behavior normal.  ED Results / Procedures / Treatments   Labs (all labs ordered are listed, but only abnormal results are displayed) Labs Reviewed  BASIC METABOLIC PANEL - Abnormal; Notable for the following components:      Result Value   Sodium 132 (*)    Glucose, Bld 101 (*)    All other components within normal limits  CBC WITH DIFFERENTIAL/PLATELET - Abnormal; Notable for the following components:   Eosinophils Absolute 1.0 (*)    All other components within normal limits  T4, FREE - Abnormal; Notable for the following components:   Free T4 1.13 (*)    All other components within normal limits  RESP PANEL BY RT-PCR (FLU A&B, COVID) ARPGX2  TSH  CBG MONITORING, ED    EKG EKG Interpretation  Date/Time:  Friday December 04 2021 09:17:32 EST Ventricular Rate:  61 PR Interval:  158 QRS Duration: 80 QT Interval:  400 QTC Calculation: 402 R Axis:   14 Text Interpretation: Normal sinus rhythm Confirmed by Octaviano Glow [phone]) on 12/04/2021 9:47:32 AM  Radiology No results found.  Procedures Procedures   Medications Ordered in ED Medications - No data to display  ED Course  I have reviewed the triage vital signs and the nursing notes.  Pertinent labs & imaging results that were available during my care of the patient were reviewed by me and considered in my medical decision making (see chart for details).  This patient complains of fatigue, near syncope..  This involves an extensive number of treatment options, and is a complaint that carries with it a high risk of complications and  morbidity.  The differential diagnosis includes dehydration versus anemia versus orthostatic hypotension versus viral illness including influenza versus thyroid disorder versus other  I have a lower suspicion for ACS, PE, aortic dissection, given his clinical presentation.  She has no localizing stroke symptoms.  Have a low suspicion for meningitis.  Her headache appears tension type, and she does suffer from frequent headaches.  She is being referred to see hematologist due to "elevated eosinophil count" per chart review, with eosinophil 16.9 (chronically elevated per chart review) 3 weeks ago.  Outpatient record from 3 weeks ago shows WBC 7.2 (and hgb normal at 12.0).  TSH 3 weeks ago was also normal at 4.13. B12 levels supratherapeutic at 1514, 3 weeks ago- unlikely to be B12 deficiency  Labs reviewed, unremarakble.  No evidence of anemia or sig thyroid disorder, covid or flu.  Ortho VS normal.  Doubt PE, ACS, Sepsis, hypoglycemia, CVA.  Clinical Course as of 12/04/21 1713  Fri Dec 04, 2021  0947 EKG per my interpretation shows a normal sinus rhythm without acute ischemic findings, no evidence of arrhythmia. [MT]  1206 TSH and T4 levels are therapeutic (t4 only marginally above normal).  Orthostatic vital signs were unremarkable.  I would advise follow-up with hematology as scheduled in 4 days for eosinophilia, and will provide a work excuse, but otherwise she is stable for discharge [MT]    Clinical Course User Index [MT] Hakan Nudelman, Carola Rhine, MD    Final Clinical Impression(s) / ED Diagnoses Final diagnoses:  Near syncope  Eosinophilia, unspecified type    Rx / DC Orders ED Discharge Orders     None        Wyvonnia Dusky, MD 12/04/21 [phone]

## 2021-12-04 NOTE — ED Triage Notes (Signed)
Pt here  from home with c/o dizziness not felt well for approx 5 weeks , had blood work done but did not find anything , had a couple of dizzy spells near syncopal episode this morning

## 2021-12-08 ENCOUNTER — Inpatient Hospital Stay: Payer: BC Managed Care – PPO

## 2021-12-08 ENCOUNTER — Other Ambulatory Visit: Payer: Self-pay

## 2021-12-08 ENCOUNTER — Inpatient Hospital Stay: Payer: BC Managed Care – PPO | Attending: Hematology | Admitting: Hematology

## 2021-12-08 VITALS — BP 127/73 | HR 76 | Temp 97.7°F | Resp 17 | Wt 128.0 lb

## 2021-12-08 DIAGNOSIS — D472 Monoclonal gammopathy: Secondary | ICD-10-CM | POA: Insufficient documentation

## 2021-12-08 DIAGNOSIS — D721 Eosinophilia, unspecified: Secondary | ICD-10-CM | POA: Diagnosis not present

## 2021-12-08 DIAGNOSIS — R5383 Other fatigue: Secondary | ICD-10-CM | POA: Diagnosis not present

## 2021-12-08 DIAGNOSIS — R1013 Epigastric pain: Secondary | ICD-10-CM

## 2021-12-08 DIAGNOSIS — R1010 Upper abdominal pain, unspecified: Secondary | ICD-10-CM | POA: Insufficient documentation

## 2021-12-08 DIAGNOSIS — Z7952 Long term (current) use of systemic steroids: Secondary | ICD-10-CM | POA: Diagnosis not present

## 2021-12-08 DIAGNOSIS — M2550 Pain in unspecified joint: Secondary | ICD-10-CM | POA: Diagnosis not present

## 2021-12-08 DIAGNOSIS — Z96641 Presence of right artificial hip joint: Secondary | ICD-10-CM | POA: Diagnosis not present

## 2021-12-08 DIAGNOSIS — Z79899 Other long term (current) drug therapy: Secondary | ICD-10-CM | POA: Diagnosis not present

## 2021-12-08 LAB — CBC WITH DIFFERENTIAL/PLATELET
Abs Immature Granulocytes: 0.03 10*3/uL (ref 0.00–0.07)
Basophils Absolute: 0.1 10*3/uL (ref 0.0–0.1)
Basophils Relative: 1 %
Eosinophils Absolute: 0.6 10*3/uL — ABNORMAL HIGH (ref 0.0–0.5)
Eosinophils Relative: 8 %
HCT: 36.5 % (ref 36.0–46.0)
Hemoglobin: 11.8 g/dL — ABNORMAL LOW (ref 12.0–15.0)
Immature Granulocytes: 0 %
Lymphocytes Relative: 16 %
Lymphs Abs: 1.1 10*3/uL (ref 0.7–4.0)
MCH: 27.8 pg (ref 26.0–34.0)
MCHC: 32.3 g/dL (ref 30.0–36.0)
MCV: 86.1 fL (ref 80.0–100.0)
Monocytes Absolute: 0.5 10*3/uL (ref 0.1–1.0)
Monocytes Relative: 7 %
Neutro Abs: 5 10*3/uL (ref 1.7–7.7)
Neutrophils Relative %: 68 %
Platelets: 383 10*3/uL (ref 150–400)
RBC: 4.24 MIL/uL (ref 3.87–5.11)
RDW: 13.9 % (ref 11.5–15.5)
WBC: 7.3 10*3/uL (ref 4.0–10.5)
nRBC: 0 % (ref 0.0–0.2)

## 2021-12-08 LAB — CMP (CANCER CENTER ONLY)
ALT: 32 U/L (ref 0–44)
AST: 25 U/L (ref 15–41)
Albumin: 3.9 g/dL (ref 3.5–5.0)
Alkaline Phosphatase: 66 U/L (ref 38–126)
Anion gap: 7 (ref 5–15)
BUN: 15 mg/dL (ref 6–20)
CO2: 24 mmol/L (ref 22–32)
Calcium: 8.8 mg/dL — ABNORMAL LOW (ref 8.9–10.3)
Chloride: 105 mmol/L (ref 98–111)
Creatinine: 0.71 mg/dL (ref 0.44–1.00)
GFR, Estimated: >60 mL/min (ref >60)
Glucose, Bld: 91 mg/dL (ref 70–99)
Potassium: 3.9 mmol/L (ref 3.5–5.1)
Sodium: 136 mmol/L (ref 135–145)
Total Bilirubin: 0.4 mg/dL (ref 0.3–1.2)
Total Protein: 8.2 g/dL — ABNORMAL HIGH (ref 6.5–8.1)

## 2021-12-08 LAB — CORTISOL: Cortisol, Plasma: 9.4 ug/dL

## 2021-12-08 LAB — SEDIMENTATION RATE: Sed Rate: 35 mm/hr — ABNORMAL HIGH (ref 0–22)

## 2021-12-08 LAB — FERRITIN: Ferritin: 94 ng/mL (ref 11–307)

## 2021-12-08 LAB — LACTATE DEHYDROGENASE: LDH: 177 U/L (ref 98–192)

## 2021-12-08 LAB — HIV ANTIBODY (ROUTINE TESTING W REFLEX): HIV Screen 4th Generation wRfx: NONREACTIVE

## 2021-12-08 LAB — T4, FREE: Free T4: 1.14 ng/dL — ABNORMAL HIGH (ref 0.61–1.12)

## 2021-12-08 LAB — VITAMIN B12: Vitamin B-12: 1264 pg/mL — ABNORMAL HIGH (ref 180–914)

## 2021-12-08 LAB — TSH: TSH: 0.734 u[IU]/mL (ref 0.308–3.960)

## 2021-12-08 NOTE — Progress Notes (Deleted)
[facility]   HEMATOLOGY/ONCOLOGY CONSULTATION NOTE  Date of Service: 12/08/2021  Patient Care Team: Binnie Rail, MD as PCP - General (Internal Medicine) Paralee Cancel, MD (Orthopedic Surgery) Deneise Lever, MD (Pulmonary Disease)  CHIEF COMPLAINTS/PURPOSE OF CONSULTATION:  Eosinophilia with significant persistent fatigue  HISTORY OF PRESENTING ILLNESS:   Maria Barker is a wonderful 60 y.o. female who has been referred to [place] by Dr .Quay Burow, Claudina Lick, MD for evaluation and management of eosinophilia.   MEDICAL HISTORY:  Past Medical History:  Diagnosis Date   Arthritis    R THR 2011   Back pain    Frequent headaches    Hyperlipidemia LDL goal < 130    Hypertension    Hypothyroid 02/06/2015   Dx 01/2015: TSH 5.4   Insomnia    Left hip pain    Pain of left thigh     SURGICAL HISTORY: Past Surgical History:  Procedure Laterality Date   TOTAL HIP ARTHROPLASTY Right 03/31/10   olin   TOTAL HIP ARTHROPLASTY Left 03/08/2016   Procedure: TOTAL HIP ARTHROPLASTY;  Surgeon: Frederik Pear, MD;  Location: [facility];  Service: Orthopedics;  Laterality: Left;   TUBAL LIGATION     UPPER GASTROINTESTINAL ENDOSCOPY      SOCIAL HISTORY: Social History   Socioeconomic History   Marital status: Married    Spouse name: Not on file   Number of children: 5   Years of education: Not on file   Highest education level: Not on file  Occupational History   Occupation: Glass blower/designer    Employer: [employer]  Tobacco Use   Smoking status: Never   Smokeless tobacco: Never  Substance and Sexual Activity   Alcohol use: No   Drug use: No   Sexual activity: Not on file  Other Topics Concern   Not on file  Social History Narrative   Not on file   Social Determinants of Health   Financial Resource Strain: Not on file  Food Insecurity: Not on file  Transportation Needs: Not on file  Physical Activity: Not on file  Stress: Not on file  Social Connections: Not on file  Intimate Partner  Violence: Not on file    FAMILY HISTORY: Family History  Problem Relation Age of Onset   CVA Mother    Hypertension Mother     ALLERGIES:  is allergic to asa [aspirin] and ibuprofen.  MEDICATIONS:  Current Outpatient Medications  Medication Sig Dispense Refill   atorvastatin (LIPITOR) 10 MG tablet TAKE 1 TABLET BY MOUTH ONCE DAILY(MUST SEE DOCTOR FOR REFILLS ANNUAL APPT IN NOV) 90 tablet 1   Calcium Carbonate-Vitamin D (CALCIUM-VITAMIN D3 PO) Take 1 each by mouth 2 (two) times daily.     Cetirizine HCl 10 MG CAPS Take 1 capsule (10 mg total) by mouth daily for 10 days. 10 capsule 0   estradiol (ESTRACE) 0.1 MG/GM vaginal cream Place vaginally.     levothyroxine (EUTHYROX) 50 MCG tablet TAKE 1 TABLET BY MOUTH ONCE DAILY 6  DAYS  A  WEEK 90 tablet 2   olmesartan (BENICAR) 5 MG tablet TAKE 1 TABLET BY MOUTH ONCE DAILY . 90 tablet 1   Omega-3 1000 MG CAPS Take 2,000 mg by mouth.     omeprazole (PRILOSEC) 20 MG capsule TAKE 1 CAPSULE BY MOUTH ONCE DAILY 30  MINUTES  PRIOR  TO  A  MEAL 30 capsule 0   vitamin B-12 (CYANOCOBALAMIN) 1000 MCG tablet Take 1,000 mcg by mouth daily.  zaleplon (SONATA) 10 MG capsule Take 1 capsule (10 mg total) by mouth at bedtime as needed for sleep. 30 capsule 0   No current facility-administered medications for this visit.    REVIEW OF SYSTEMS:    10 Point review of Systems was done is negative except as noted above.  PHYSICAL EXAMINATION: ECOG PERFORMANCE STATUS: 1 - Symptomatic but completely ambulatory  .There were no vitals filed for this visit. There were no vitals filed for this visit. .There is no height or weight on file to calculate BMI.  GENERAL:alert, in no acute distress and comfortable SKIN: no acute rashes, no significant lesions EYES: conjunctiva are pink and non-injected, sclera anicteric OROPHARYNX: MMM, no exudates, no oropharyngeal erythema or ulceration NECK: supple, no JVD LYMPH:  no palpable lymphadenopathy in the cervical,  axillary or inguinal regions LUNGS: clear to auscultation b/l with normal respiratory effort HEART: regular rate & rhythm ABDOMEN:  normoactive bowel sounds , non tender, not distended. Extremity: no pedal edema PSYCH: alert & oriented x 3 with fluent speech NEURO: no focal motor/sensory deficits  LABORATORY DATA:  I have reviewed the data as listed  . CBC Latest Ref Rng & Units 12/04/2021 11/13/2021 11/10/2020  WBC 4.0 - 10.5 K/uL 7.1 7.2 7.9  Hemoglobin 12.0 - 15.0 g/dL 12.4 12.0 12.2  Hematocrit 36.0 - 46.0 % 39.5 36.1 37.1  Platelets 150 - 400 K/uL 384 322.0 312.0    . CMP Latest Ref Rng & Units 12/04/2021 11/13/2021 05/11/2021  Glucose 70 - 99 mg/dL 101(H) 84 92  BUN 6 - 20 mg/dL 13 16 18   Creatinine 0.44 - 1.00 mg/dL 0.65 0.60 0.60  Sodium 135 - 145 mmol/L 132(L) 132(L) 136  Potassium 3.5 - 5.1 mmol/L 3.7 4.0 4.0  Chloride 98 - 111 mmol/L 103 99 105  CO2 22 - 32 mmol/L 22 26 25   Calcium 8.9 - 10.3 mg/dL 9.1 9.2 8.9  Total Protein 6.0 - 8.3 g/dL - 7.8 8.1  Total Bilirubin 0.2 - 1.2 mg/dL - 0.5 0.3  Alkaline Phos 39 - 117 U/L - 54 57  AST 0 - 37 U/L - 28 26  ALT 0 - 35 U/L - 31 24     RADIOGRAPHIC STUDIES: I have personally reviewed the radiological images as listed and agreed with the findings in the report. No results found.  ASSESSMENT & PLAN:   60 yo with     All of the patients questions were answered with apparent satisfaction. The patient knows to call the clinic with any problems, questions or concerns.  I spent {CHL ONC TIME VISIT - TXMIW:[id number]} counseling the patient face to face. The total time spent in the appointment was {CHL ONC TIME VISIT - NOIBB:[id number]} and more than 50% was on counseling and direct patient cares.    Sullivan Lone MD [place] AAHIVMS [facility] Hematology/Oncology Physician [facility]  (Office):       [phone] (Work cell):  [phone] (Fax):           [phone]  12/08/2021 11:17 AM

## 2021-12-08 NOTE — Patient Instructions (Signed)
Thank you for choosing [facility] to provide your care.   Should you have questions after your visit to the [facility] ([facility]), please contact this office at [phone] between 8:30 AM and 4:30 PM.  Voice mails left after 4:00 PM may not be returned until the following business day.  Calls received after 4:30 PM will be answered by an off-site Nurse Triage Line.    Prescription Refills:  Please have your pharmacy contact us directly for most prescription requests.  Contact the office directly for refills of narcotics (pain medications). Allow 48-72 hours for refills.  Appointments: Please contact the [facility] scheduling department [phone] for questions regarding [facility] appointment scheduling.  Contact the schedulers with any scheduling changes so that your appointment can be rescheduled in a timely manner.   Central Scheduling for [place] [phone] - Call to schedule procedures such as PET scans, CT scans, MRI, Ultrasound, etc.  To afford each patient quality time with our providers, please arrive 30 minutes before your scheduled appointment time.  If you arrive late for your appointment, you may be asked to reschedule.  We strive to give you quality time with our providers, and arriving late affects you and other patients whose appointments are after yours. If you are a no show for multiple scheduled visits, you may be dismissed from the clinic at the providers discretion.     Resources: [facility] Social Workers [phone] for additional information on assistance programs or assistance connecting with community support programs   [place] DSS  [phone]: Information regarding food stamps, Medicaid, and utility assistance [place] [employer] [place] [phone]   [place] [employer]'s shared-ride transportation service for eligible riders who have a disability that prevents them from riding the fixed route bus.   Medicare Rights Center 800-333-4114  Helps people with Medicare understand their rights and benefits, navigate the Medicare system, and secure the quality healthcare they deserve [employer] 800-227-2345 Assists patients locate various types of support and financial assistance Cancer Care: 1-800-813-HOPE (4673) Provides financial assistance, online support groups, medication/co-pay assistance.   Transportation Assistance for appointments at [facility]: Transportation Coordinator [phone]  Again, thank you for choosing [facility] for your care.       

## 2021-12-09 LAB — RHEUMATOID ARTHRITIS PROFILE
CCP Antibodies IgG/IgA: 22 units — ABNORMAL HIGH (ref 0–19)
Rheumatoid fact SerPl-aCnc: <10 IU/mL (ref <14.0)

## 2021-12-10 ENCOUNTER — Encounter: Payer: Self-pay | Admitting: Internal Medicine

## 2021-12-12 LAB — MPN W/HYPEREOSINOPHILIA FISH

## 2021-12-14 LAB — MULTIPLE MYELOMA PANEL, SERUM
Albumin SerPl Elph-Mcnc: 3.9 g/dL (ref 2.9–4.4)
Albumin/Glob SerPl: 1 (ref 0.7–1.7)
Alpha 1: 0.2 g/dL (ref 0.0–0.4)
Alpha2 Glob SerPl Elph-Mcnc: 0.8 g/dL (ref 0.4–1.0)
B-Globulin SerPl Elph-Mcnc: 1 g/dL (ref 0.7–1.3)
Gamma Glob SerPl Elph-Mcnc: 2.1 g/dL — ABNORMAL HIGH (ref 0.4–1.8)
Globulin, Total: 4 g/dL — ABNORMAL HIGH (ref 2.2–3.9)
IgA: 121 mg/dL (ref 87–352)
IgG (Immunoglobin G), Serum: 2460 mg/dL — ABNORMAL HIGH (ref 586–1602)
IgM (Immunoglobulin M), Srm: 58 mg/dL (ref 26–217)
M Protein SerPl Elph-Mcnc: 1.4 g/dL — ABNORMAL HIGH
Total Protein ELP: 7.9 g/dL (ref 6.0–8.5)

## 2021-12-18 ENCOUNTER — Ambulatory Visit: Payer: BC Managed Care – PPO | Admitting: Internal Medicine

## 2021-12-18 ENCOUNTER — Encounter: Payer: Self-pay | Admitting: Internal Medicine

## 2021-12-18 ENCOUNTER — Other Ambulatory Visit: Payer: Self-pay

## 2021-12-18 DIAGNOSIS — J309 Allergic rhinitis, unspecified: Secondary | ICD-10-CM

## 2021-12-18 DIAGNOSIS — R5383 Other fatigue: Secondary | ICD-10-CM

## 2021-12-18 DIAGNOSIS — R7303 Prediabetes: Secondary | ICD-10-CM

## 2021-12-18 DIAGNOSIS — E038 Other specified hypothyroidism: Secondary | ICD-10-CM

## 2021-12-18 DIAGNOSIS — I10 Essential (primary) hypertension: Secondary | ICD-10-CM

## 2021-12-18 MED ORDER — PREDNISONE 10 MG PO TABS: ORAL_TABLET | ORAL | 0 refills | Status: DC

## 2021-12-18 NOTE — Patient Instructions (Signed)
Please take all new medication as prescribed - the prednisone  Please continue all other medications as before, and refills have been done if requested.  Please have the pharmacy call with any other refills you may need.  Please continue your efforts at being more active, low cholesterol diet,  Please keep your appointments with your specialists as you may have planned

## 2021-12-18 NOTE — Progress Notes (Signed)
Patient ID: Maria Barker, female   DOB: 05-Jan-1961, 60 y.o.   MRN: [id number]        Chief Complaint: follow up lack of energy for 6 wks       HPI:  Maria Barker is a 60 y.o. female here with c/o low energy and stamina 6 wks o/w unexplained.  Does have several wks ongoing nasal allergy symptoms with clearish congestion, itch and sneezing, without fever, pain, ST, cough, swelling or wheezing.  But o/w Denies worsening depressive symptoms, suicidal ideation, or panic.  Has some tinnitus of the right ear and occasional feeling of dizzy lightheaded but drinking plent of fluids.  Pt denies chest pain, increased sob or doe, wheezing, orthopnea, PND, increased LE swelling, palpitations, dizziness or syncope.   Pt denies polydipsia, polyuria, or new focal neuro s/s.  Denies worsening reflux, abd pain, dysphagia, n/v, bowel change or blood.  Denies hyper or hypo thyroid symptoms such as voice, skin or hair change.  Recent multple labs per PCP and later hematology oncology are unrevealing including MM panel, cortisol, iron/ferritin, TFTs, b12, TSH, RA panel, esr, cbc and cmet.  COVID neg and Flu neg dec 9.   No recent med changes.  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       Wt Readings from Last 3 Encounters:  12/18/21 130 lb 9.6 oz (59.2 kg)  12/08/21 128 lb (58.1 kg)  11/13/21 127 lb (57.6 kg)   BP Readings from Last 3 Encounters:  12/18/21 112/60  12/08/21 127/73  12/04/21 (!) 133/91         Past Medical History:  Diagnosis Date   Arthritis    R THR 2011   Back pain    Frequent headaches    Hyperlipidemia LDL goal < 130    Hypertension    Hypothyroid 02/06/2015   Dx 01/2015: TSH 5.4   Insomnia    Left hip pain    Pain of left thigh    Past Surgical History:  Procedure Laterality Date   TOTAL HIP ARTHROPLASTY Right 03/31/10   olin   TOTAL HIP ARTHROPLASTY Left 03/08/2016   Procedure: TOTAL HIP ARTHROPLASTY;  Surgeon: Frederik Pear, MD;  Location: [facility];   Service: Orthopedics;  Laterality: Left;   TUBAL LIGATION     UPPER GASTROINTESTINAL ENDOSCOPY      reports that she has never smoked. She has never used smokeless tobacco. She reports that she does not drink alcohol and does not use drugs. family history includes CVA in her mother; Hypertension in her mother. Allergies  Allergen Reactions   Asa [Aspirin] Nausea And Vomiting    UPSET STOMACH   Ibuprofen Nausea Only   Current Outpatient Medications on File Prior to Visit  Medication Sig Dispense Refill   atorvastatin (LIPITOR) 10 MG tablet TAKE 1 TABLET BY MOUTH ONCE DAILY(MUST SEE DOCTOR FOR REFILLS ANNUAL APPT IN NOV) 90 tablet 1   Calcium Carbonate-Vitamin D (CALCIUM-VITAMIN D3 PO) Take 1 each by mouth 2 (two) times daily.     estradiol (ESTRACE) 0.1 MG/GM vaginal cream Place vaginally.     levothyroxine (EUTHYROX) 50 MCG tablet TAKE 1 TABLET BY MOUTH ONCE DAILY 6  DAYS  A  WEEK 90 tablet 2   olmesartan (BENICAR) 5 MG tablet TAKE 1 TABLET BY MOUTH ONCE DAILY . 90 tablet 1   Omega-3 1000 MG CAPS Take 2,000 mg by mouth.     omeprazole (PRILOSEC) 20 MG capsule TAKE 1 CAPSULE  BY MOUTH ONCE DAILY 30  MINUTES  PRIOR  TO  A  MEAL 30 capsule 0   vitamin B-12 (CYANOCOBALAMIN) 1000 MCG tablet Take 1,000 mcg by mouth daily.     zaleplon (SONATA) 10 MG capsule Take 1 capsule (10 mg total) by mouth at bedtime as needed for sleep. 30 capsule 0   Cetirizine HCl 10 MG CAPS Take 1 capsule (10 mg total) by mouth daily for 10 days. 10 capsule 0   No current facility-administered medications on file prior to visit.        ROS:  All others reviewed and negative.  Objective        PE:  BP 112/60 (BP Location: Left Arm, Patient Position: Sitting, Cuff Size: Normal)    Pulse 73    Ht _0  (1.499 m)    Wt 130 lb 9.6 oz (59.2 kg)    SpO2 98%    BMI 26.38 kg/m                 Constitutional: Pt appears in NAD               HENT: Head: NCAT.                Right Ear: External ear normal.                  Left Ear: External ear normal. Bilat tm's with mild erythema.  Max sinus areas non tender.  Pharynx with mild erythema, no exudate               Eyes: . Pupils are equal, round, and reactive to light. Conjunctivae and EOM are normal               Nose: without d/c or deformity               Neck: Neck supple. Gross normal ROM               Cardiovascular: Normal rate and regular rhythm.                 Pulmonary/Chest: Effort normal and breath sounds without rales or wheezing.                Abd:  Soft, NT, ND, + BS, no organomegaly               Neurological: Pt is alert. At baseline orientation, motor grossly intact               Skin: Skin is warm. No rashes, no other new lesions, LE edema - none               Psychiatric: Pt behavior is normal without agitation   Micro: none  Cardiac tracings I have personally interpreted today:  none  Pertinent Radiological findings (summarize): none   Lab Results  Component Value Date   WBC 7.3 12/08/2021   HGB 11.8 (L) 12/08/2021   HCT 36.5 12/08/2021   PLT 383 12/08/2021   GLUCOSE 91 12/08/2021   CHOL 185 11/13/2021   TRIG 129.0 11/13/2021   HDL 58.10 11/13/2021   LDLDIRECT 93.7 12/21/2013   LDLCALC 101 (H) 11/13/2021   ALT 32 12/08/2021   AST 25 12/08/2021   NA 136 12/08/2021   K 3.9 12/08/2021   CL 105 12/08/2021   CREATININE 0.71 12/08/2021   BUN 15 12/08/2021   CO2 24 12/08/2021   TSH 0.734 12/08/2021   INR 1.18  02/27/2016   HGBA1C 6.0 11/13/2021   Assessment/Plan:  Maria Barker is a 60 y.o. Asian [4] female with  has a past medical history of Arthritis, Back pain, Frequent headaches, Hyperlipidemia LDL goal < 130, Hypertension, Hypothyroid (02/06/2015), Insomnia, Left hip pain, and Pain of left thigh.  Allergic rhinitis Mild possible seasonal flare, for prednisone taper asd, then otc antihistamine prn, to f/u any worsening symptoms or concerns  Fatigue I suspect due to chronic overexertion but cannot prove this by testing,  pt does not want to slow up on working, exam and recent evalution essentially negative,  to f/u any worsening symptoms or concerns,   Hypothyroid Lab Results  Component Value Date   TSH 0.734 12/08/2021   Stable, pt to continue levothyroxine   Prediabetes Lab Results  Component Value Date   HGBA1C 6.0 11/13/2021   Stable, pt to continue current medical treatment  - diet   Essential hypertension BP Readings from Last 3 Encounters:  12/18/21 112/60  12/08/21 127/73  12/04/21 (!) 133/91   Stable, pt to continue medical treatment benicar 5  Followup: Return if symptoms worsen or fail to improve.  Cathlean Cower, MD 12/21/2021 3:57 PM [facility]

## 2021-12-21 ENCOUNTER — Encounter: Payer: Self-pay | Admitting: Internal Medicine

## 2021-12-21 DIAGNOSIS — R5383 Other fatigue: Secondary | ICD-10-CM | POA: Insufficient documentation

## 2021-12-21 DIAGNOSIS — J309 Allergic rhinitis, unspecified: Secondary | ICD-10-CM | POA: Insufficient documentation

## 2021-12-21 NOTE — Assessment & Plan Note (Signed)
I suspect due to chronic overexertion but cannot prove this by testing, pt does not want to slow up on working, exam and recent evalution essentially negative,  to f/u any worsening symptoms or concerns,

## 2021-12-21 NOTE — Assessment & Plan Note (Signed)
BP Readings from Last 3 Encounters:  12/18/21 112/60  12/08/21 127/73  12/04/21 (!) 133/91   Stable, pt to continue medical treatment benicar 5

## 2021-12-21 NOTE — Assessment & Plan Note (Signed)
Mild possible seasonal flare, for prednisone taper asd, then otc antihistamine prn, to f/u any worsening symptoms or concerns

## 2021-12-21 NOTE — Assessment & Plan Note (Signed)
Lab Results  Component Value Date   HGBA1C 6.0 11/13/2021   Stable, pt to continue current medical treatment  - diet

## 2021-12-21 NOTE — Assessment & Plan Note (Addendum)
Lab Results  Component Value Date   TSH 0.734 12/08/2021   Stable, pt to continue levothyroxine

## 2021-12-25 ENCOUNTER — Other Ambulatory Visit: Payer: Self-pay

## 2021-12-25 ENCOUNTER — Inpatient Hospital Stay: Payer: BC Managed Care – PPO | Admitting: Hematology

## 2021-12-25 VITALS — BP 120/67 | HR 74 | Temp 97.5°F | Resp 17 | Ht 59.0 in | Wt 129.7 lb

## 2021-12-25 DIAGNOSIS — D472 Monoclonal gammopathy: Secondary | ICD-10-CM

## 2021-12-25 DIAGNOSIS — R768 Other specified abnormal immunological findings in serum: Secondary | ICD-10-CM | POA: Diagnosis not present

## 2021-12-25 DIAGNOSIS — M2550 Pain in unspecified joint: Secondary | ICD-10-CM | POA: Diagnosis not present

## 2021-12-25 DIAGNOSIS — D721 Eosinophilia, unspecified: Secondary | ICD-10-CM

## 2021-12-25 DIAGNOSIS — Z7952 Long term (current) use of systemic steroids: Secondary | ICD-10-CM | POA: Diagnosis not present

## 2021-12-25 DIAGNOSIS — Z96641 Presence of right artificial hip joint: Secondary | ICD-10-CM | POA: Diagnosis not present

## 2021-12-25 DIAGNOSIS — R198 Other specified symptoms and signs involving the digestive system and abdomen: Secondary | ICD-10-CM | POA: Diagnosis not present

## 2021-12-25 DIAGNOSIS — Z79899 Other long term (current) drug therapy: Secondary | ICD-10-CM | POA: Diagnosis not present

## 2021-12-25 DIAGNOSIS — R1010 Upper abdominal pain, unspecified: Secondary | ICD-10-CM | POA: Diagnosis not present

## 2021-12-25 DIAGNOSIS — R5383 Other fatigue: Secondary | ICD-10-CM | POA: Diagnosis not present

## 2021-12-27 NOTE — Progress Notes (Signed)
[facility]   HEMATOLOGY/ONCOLOGY CONSULTATION NOTE  Date of Service:.12/08/2021   Patient Care Team: Pincus Sanes, MD as PCP - General (Internal Medicine) Durene Romans, MD (Orthopedic Surgery) Waymon Budge, MD (Pulmonary Disease)  CHIEF COMPLAINTS/PURPOSE OF CONSULTATION:  Chronic eosinophilia  HISTORY OF PRESENTING ILLNESS:   Elmire Amrein is a wonderful 61 y.o. female who has been referred to [place] by Dr .Lawerance Bach, Bobette Mo, MD  for evaluation and management of chronic eosinophilia rule out hypereosinophilic syndrome. Patient speaks some English but her daughter is also available with her to help with some translation.  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  Patient recently had labs on 11/13/2021 with her primary care physician that showed absolute eosinophil count of 1200 making up 17% of her total WBC count of 7.2k.  She had normal hemoglobin of 12 and normal platelets of 322k Review of available labs in our system shows the patient has had chronic eosinophilia since at least September 2018 with eosinophil counts ranging from [phone].  Her eosinophil counts have not shown a progressive increase. She notes generalized arthralgias especially in her hands knees and feet.  She does have joint stiffness lasting about 30 to 60 minutes in the morning. She does not have much detail about the need for a right total hip replacement at a relatively young age of 17yr.  Is not aware of any family history of autoimmune conditions like rheumatoid arthritis or lupus. She notes significant increase in fatigue over the last 2 to 3 months with decreased appetite. She is unable to quantify any specific weight loss She does note some increased abdominal fullness in  her upper abdomen. No overt change in bowel habits.  No skin rashes.  No other acute new focal symptoms. No bone pains no fevers no chills no night sweats. Notes chronic stress at work and chronic insomnia likely related to her shift work. Denies any overt respiratory allergies skin allergies or other seasonal allergies. No recent travel outside [place] the last 20 to 30 years. Denies any hiking camping or using untreated lake or stream water. No history of blood transfusions. No history of high risk sexual behavior.  MEDICAL HISTORY:  Past Medical History:  Diagnosis Date   Arthritis    R THR 2011   Back pain    Frequent headaches    Hyperlipidemia LDL goal < 130    Hypertension    Hypothyroid 02/06/2015   Dx 01/2015: TSH 5.4   Insomnia    Left hip pain    Pain of left thigh     SURGICAL HISTORY: Past Surgical History:  Procedure Laterality Date   TOTAL HIP ARTHROPLASTY Right 03/31/10   olin   TOTAL HIP ARTHROPLASTY Left 03/08/2016   Procedure: TOTAL HIP ARTHROPLASTY;  Surgeon: Gean Birchwood, MD;  Location: [facility];  Service: Orthopedics;  Laterality: Left;   TUBAL LIGATION     UPPER GASTROINTESTINAL ENDOSCOPY      SOCIAL HISTORY: Social History   Socioeconomic History   Marital status: Married    Spouse name: Not on file   Number of children: 5   Years of education: Not on file   Highest education level: Not on file  Occupational History   Occupation: Location manager    Employer: [employer]  Tobacco Use   Smoking status: Never   Smokeless tobacco: Never  Substance and Sexual Activity   Alcohol use: No   Drug use: No   Sexual activity: Not on file  Other Topics Concern   Not on file  Social History Narrative   Not on file   Social Determinants of Health   Financial Resource Strain: Not on file  Food Insecurity: Not on file  Transportation Needs: Not on file  Physical Activity: Not on file  Stress: Not on file  Social Connections: Not on file   Intimate Partner Violence: Not on file    FAMILY HISTORY: Family History  Problem Relation Age of Onset   CVA Mother    Hypertension Mother     ALLERGIES:  is allergic to asa [aspirin] and ibuprofen.  MEDICATIONS:  Current Outpatient Medications  Medication Sig Dispense Refill   atorvastatin (LIPITOR) 10 MG tablet TAKE 1 TABLET BY MOUTH ONCE DAILY(MUST SEE DOCTOR FOR REFILLS ANNUAL APPT IN NOV) 90 tablet 1   Calcium Carbonate-Vitamin D (CALCIUM-VITAMIN D3 PO) Take 1 each by mouth 2 (two) times daily.     estradiol (ESTRACE) 0.1 MG/GM vaginal cream Place vaginally.     levothyroxine (EUTHYROX) 50 MCG tablet TAKE 1 TABLET BY MOUTH ONCE DAILY 6  DAYS  A  WEEK 90 tablet 2   olmesartan (BENICAR) 5 MG tablet TAKE 1 TABLET BY MOUTH ONCE DAILY . 90 tablet 1   Omega-3 1000 MG CAPS Take 2,000 mg by mouth.     omeprazole (PRILOSEC) 20 MG capsule TAKE 1 CAPSULE BY MOUTH ONCE DAILY 30  MINUTES  PRIOR  TO  A  MEAL 30 capsule 0   vitamin B-12 (CYANOCOBALAMIN) 1000 MCG tablet Take 1,000 mcg by mouth daily.     zaleplon (SONATA) 10 MG capsule Take 1 capsule (10 mg total) by mouth at bedtime as needed for sleep. 30 capsule 0   Cetirizine HCl 10 MG CAPS Take 1 capsule (10 mg total) by mouth daily for 10 days. 10 capsule 0   predniSONE (DELTASONE) 10 MG tablet 3 tabs by mouth per day for 3 days,2tabs per day for 3 days,1tab per day for 3 days 18 tablet 0   No current facility-administered medications for this visit.    REVIEW OF SYSTEMS:    10 Point review of Systems was done is negative except as noted above.  PHYSICAL EXAMINATION: ECOG PERFORMANCE STATUS: 1 - Symptomatic but completely ambulatory  . Vitals:   12/08/21 1120  BP: 127/73  Pulse: 76  Resp: 17  Temp: 97.7 F (36.5 C)  SpO2: 99%   Filed Weights   12/08/21 1120  Weight: 128 lb (58.1 kg)   .Body mass index is 25.85 kg/m.  GENERAL:alert, in no acute distress and comfortable SKIN: no acute rashes, no significant  lesions EYES: conjunctiva are pink and non-injected, sclera anicteric OROPHARYNX: MMM, no exudates, no oropharyngeal erythema or ulceration NECK: supple, no JVD LYMPH:  no palpable lymphadenopathy in the cervical, axillary or inguinal regions LUNGS: clear to auscultation b/l with normal respiratory effort HEART: regular rate & rhythm ABDOMEN:  normoactive bowel sounds , non tender, not distended.  No palpable hepatosplenomegaly Extremity: no pedal edema PSYCH: alert & oriented x 3 with fluent speech NEURO: no focal motor/sensory deficits  LABORATORY DATA:  I have reviewed the data as listed  . CBC Latest Ref Rng & Units 12/08/2021 12/04/2021 11/13/2021  WBC 4.0 - 10.5 K/uL 7.3 7.1 7.2  Hemoglobin 12.0 - 15.0  g/dL 11.8(L) 12.4 12.0  Hematocrit 36.0 - 46.0 % 36.5 39.5 36.1  Platelets 150 - 400 K/uL 383 384 322.0    . CMP Latest Ref Rng & Units 12/08/2021 12/04/2021 11/13/2021  Glucose 70 - 99 mg/dL 91 101(H) 84  BUN 6 - 20 mg/dL $Remove'15 13 16  'xRVmTLY$ Creatinine 0.44 - 1.00 mg/dL 0.71 0.65 0.60  Sodium 135 - 145 mmol/L 136 132(L) 132(L)  Potassium 3.5 - 5.1 mmol/L 3.9 3.7 4.0  Chloride 98 - 111 mmol/L 105 103 99  CO2 22 - 32 mmol/L $RemoveB'24 22 26  'FjLIQruk$ Calcium 8.9 - 10.3 mg/dL 8.8(L) 9.1 9.2  Total Protein 6.5 - 8.1 g/dL 8.2(H) - 7.8  Total Bilirubin 0.3 - 1.2 mg/dL 0.4 - 0.5  Alkaline Phos 38 - 126 U/L 66 - 54  AST 15 - 41 U/L 25 - 28  ALT 0 - 44 U/L 32 - 31   Component     Latest Ref Rng & Units 09/16/2017 03/16/2018 10/12/2018 01/08/2019  Eosinophils Absolute     0.0 - 0.5 K/uL 1.4 (H) 1.4 (H) 2.4 (H) 1.1 (H)   Component     Latest Ref Rng & Units 11/09/2019 11/10/2020 11/13/2021 12/04/2021  Eosinophils Absolute     0.0 - 0.5 K/uL 0.9 (H) 0.9 (H) 1.2 (H) 1.0 (H)   Component     Latest Ref Rng & Units 12/08/2021  Eosinophils Absolute     0.0 - 0.5 K/uL 0.6 (H)    RADIOGRAPHIC STUDIES: I have personally reviewed the radiological images as listed and agreed with the findings in the  report. No results found.  ASSESSMENT & PLAN:   61 year old with  1) Chronic Eosinophilia Patient has had some chronic nonprogressive eosinophilia ranging from an eosinophil count of [phone] 02 September 2017. She has associated generalized arthralgias intermittent joint swelling especially in her hands with morning stiffness. Also notes increasing fatigue and anorexia.  2) upper abdominal fullness and early satiety.  PLAN -We discussed her available lab results in detail which show chronic eosinophilia.   -We discussed in details the broad list of possibilities causing chronic eosinophilia including chronic allergies, possibility of underlying undiagnosed autoimmune condition like rheumatoid arthritis which would explain her fatigue and generalized arthralgias. -The nonprogressive nature of her eosinophilia makes it less likely that this represents primary hypereosinophilic syndrome and a bone marrow disorder.  She also does not have any other signs of panmyelosis. -In the setting of fatigue upper abdominal fullness cannot rule out eosinophilia related to low-grade lymphoma though unlikely this has been going on for nearly 4 to 5 years. -Cannot rule out helminthic infection that is chronic although the patient has not had any travel outside [place] for more than 20 to 30 years. -We will send out labs to evaluate for clonal eosinophilia with hypereosinophilic syndrome panel. -CT chest abdomen pelvis to rule out any undiagnosed malignancies or lymphoma and evaluate her upper abdominal fullness.  If this is unrevealing the patient will need to follow-up with her primary care physician for GI referral to evaluate her upper GI symptoms. -Lab work-up to rule out rheumatoid arthritis -Other work-up ordered as noted below -Age-appropriate cancer screening to continue with primary care physician. . Orders Placed This Encounter  Procedures   CT CHEST ABDOMEN PELVIS W CONTRAST    Standing  Status:   Future    Standing Expiration Date:   12/08/2022    Order Specific Question:   Is patient pregnant?    Answer:   No  Order Specific Question:   Preferred imaging location?    Answer:   [facility]    Order Specific Question:   Is Oral Contrast requested for this exam?    Answer:   Yes, Per Radiology protocol   CBC with Differential/Platelet    Standing Status:   Future    Number of Occurrences:   1    Standing Expiration Date:   12/08/2022   CMP ([place] only)    Standing Status:   Future    Number of Occurrences:   1    Standing Expiration Date:   12/08/2022   TSH    Standing Status:   Future    Number of Occurrences:   1    Standing Expiration Date:   12/08/2022   T4, free    Standing Status:   Future    Number of Occurrences:   1    Standing Expiration Date:   12/08/2022   Lactate dehydrogenase    Standing Status:   Future    Number of Occurrences:   1    Standing Expiration Date:   12/08/2022   MPN w/Hypereosinophilia FISH ([facility])    Standing Status:   Future    Number of Occurrences:   1    Standing Expiration Date:   12/08/2022   Rheumatoid Arthritis Profile    Standing Status:   Future    Number of Occurrences:   1    Standing Expiration Date:   12/08/2022   Sedimentation rate    Standing Status:   Future    Number of Occurrences:   1    Standing Expiration Date:   12/08/2022   Vitamin B12    Standing Status:   Future    Number of Occurrences:   1    Standing Expiration Date:   12/08/2022   Ferritin    Standing Status:   Future    Number of Occurrences:   1    Standing Expiration Date:   12/08/2022   HIV Antibody (routine testing w rflx)    Standing Status:   Future    Number of Occurrences:   1    Standing Expiration Date:   12/08/2022   Multiple Myeloma Panel (SPEP&IFE w/QIG)    Standing Status:   Future    Number of Occurrences:   1    Standing Expiration Date:   12/08/2022   Cortisol    Standing Status:   Future    Number  of Occurrences:   1    Standing Expiration Date:   12/08/2022     All of the patients questions were answered with apparent satisfaction. The patient knows to call the clinic with any problems, questions or concerns.  I spent 40 minutes counseling the patient face to face. The total time spent in the appointment was 60 minutes and more than 50% was on counseling and direct patient cares.    Sullivan Lone MD [place] [facility] Hematology/Oncology Physician [facility]  (Office):       [phone] (Work cell):  [phone] (Fax):           [phone]

## 2021-12-28 ENCOUNTER — Encounter: Payer: Self-pay | Admitting: Hematology

## 2021-12-29 ENCOUNTER — Other Ambulatory Visit: Payer: Self-pay

## 2021-12-29 MED ORDER — PREDNISONE 10 MG PO TABS: ORAL_TABLET | ORAL | 0 refills | Status: AC

## 2021-12-29 MED ORDER — PREDNISONE 10 MG PO TABS: ORAL_TABLET | ORAL | 0 refills | Status: DC

## 2021-12-29 NOTE — Progress Notes (Signed)
Marland Kitchen   HEMATOLOGY/ONCOLOGY CLINIC NOTE  Date of Service:.12/25/2021   Patient Care Team: Binnie Rail, MD as PCP - General (Internal Medicine) Paralee Cancel, MD (Orthopedic Surgery) Deneise Lever, MD (Pulmonary Disease)  CHIEF COMPLAINTS/PURPOSE OF CONSULTATION:  Chronic eosinophilia  HISTORY OF PRESENTING ILLNESS:   Maria Barker is a wonderful 61 y.o. female who has been referred to [place] by Dr .Quay Burow, Claudina Lick, MD  for evaluation and management of chronic eosinophilia rule out hypereosinophilic syndrome. Patient speaks some English but her daughter is also available with her to help with some translation.  Patient has a history of arthritis status post right total hip replacement in 2011 (she is unclear if this was just osteoarthritis or if there is any other form of inflammatory arthritis), hypertension, dyslipidemia, hypothyroidism, chronic insomnia who works as a Glass blower/designer.  Patient recently had labs on 11/13/2021 with her primary care physician that showed absolute eosinophil count of 1200 making up 17% of her total WBC count of 7.2k.  She had normal hemoglobin of 12 and normal platelets of 322k Review of available labs in our system shows the patient has had chronic eosinophilia since at least September 2018 with eosinophil counts ranging from [phone].  Her eosinophil counts have not shown a progressive increase. She notes generalized arthralgias especially in her hands knees and feet.  She does have joint stiffness lasting about 30 to 60 minutes in the morning. She does not have much detail about the need for a right total hip replacement at a relatively young age of 43yr.  Is not aware of any family history of autoimmune conditions like rheumatoid arthritis or lupus. She notes significant increase in fatigue over the last 2 to 3 months with decreased appetite. She is unable to quantify any specific weight loss She does note some increased abdominal fullness in her  upper abdomen. No overt change in bowel habits.  No skin rashes.  No other acute new focal symptoms. No bone pains no fevers no chills no night sweats. Notes chronic stress at work and chronic insomnia likely related to her shift work. Denies any overt respiratory allergies skin allergies or other seasonal allergies. No recent travel outside [place] the last 20 to 30 years. Denies any hiking camping or using untreated lake or stream water. No history of blood transfusions. No history of high risk sexual behavior.  INTERVAL HISTORY  Patient is here for follow-up with her daughter to discuss results from her recent lab work-up for chronic eosinophilia. She notes that she has been placed on a prednisone taper set by her primary care physician for respiratory allergies. She notes that prednisone has made her feel better and has improved her fatigue and joint pains as well but she is still having some upper abdominal fullness. Has not been able to get her CT chest abdomen pelvis yet pending insurance approval. We discussed her lab results in details.  MEDICAL HISTORY:  Past Medical History:  Diagnosis Date   Arthritis    R THR 2011   Back pain    Frequent headaches    Hyperlipidemia LDL goal < 130    Hypertension    Hypothyroid 02/06/2015   Dx 01/2015: TSH 5.4   Insomnia    Left hip pain    Pain of left thigh     SURGICAL HISTORY: Past Surgical History:  Procedure Laterality Date   TOTAL HIP ARTHROPLASTY Right 03/31/10   olin   TOTAL HIP ARTHROPLASTY Left 03/08/2016  Procedure: TOTAL HIP ARTHROPLASTY;  Surgeon: Frederik Pear, MD;  Location: [facility];  Service: Orthopedics;  Laterality: Left;   TUBAL LIGATION     UPPER GASTROINTESTINAL ENDOSCOPY      SOCIAL HISTORY: Social History   Socioeconomic History   Marital status: Married    Spouse name: Not on file   Number of children: 5   Years of education: Not on file   Highest education level: Not on file  Occupational  History   Occupation: Glass blower/designer    Employer: [employer]  Tobacco Use   Smoking status: Never   Smokeless tobacco: Never  Substance and Sexual Activity   Alcohol use: No   Drug use: No   Sexual activity: Not on file  Other Topics Concern   Not on file  Social History Narrative   Not on file   Social Determinants of Health   Financial Resource Strain: Not on file  Food Insecurity: Not on file  Transportation Needs: Not on file  Physical Activity: Not on file  Stress: Not on file  Social Connections: Not on file  Intimate Partner Violence: Not on file    FAMILY HISTORY: Family History  Problem Relation Age of Onset   CVA Mother    Hypertension Mother     ALLERGIES:  is allergic to asa [aspirin] and ibuprofen.  MEDICATIONS:  Current Outpatient Medications  Medication Sig Dispense Refill   atorvastatin (LIPITOR) 10 MG tablet TAKE 1 TABLET BY MOUTH ONCE DAILY(MUST SEE DOCTOR FOR REFILLS ANNUAL APPT IN NOV) 90 tablet 1   Calcium Carbonate-Vitamin D (CALCIUM-VITAMIN D3 PO) Take 1 each by mouth 2 (two) times daily.     Cetirizine HCl 10 MG CAPS Take 1 capsule (10 mg total) by mouth daily for 10 days. 10 capsule 0   estradiol (ESTRACE) 0.1 MG/GM vaginal cream Place vaginally.     levothyroxine (EUTHYROX) 50 MCG tablet TAKE 1 TABLET BY MOUTH ONCE DAILY 6  DAYS  A  WEEK 90 tablet 2   olmesartan (BENICAR) 5 MG tablet TAKE 1 TABLET BY MOUTH ONCE DAILY . 90 tablet 1   Omega-3 1000 MG CAPS Take 2,000 mg by mouth.     omeprazole (PRILOSEC) 20 MG capsule TAKE 1 CAPSULE BY MOUTH ONCE DAILY 30  MINUTES  PRIOR  TO  A  MEAL 30 capsule 0   predniSONE (DELTASONE) 10 MG tablet 3 tabs by mouth per day for 3 days,2tabs per day for 3 days,1tab per day for 3 days 18 tablet 0   vitamin B-12 (CYANOCOBALAMIN) 1000 MCG tablet Take 1,000 mcg by mouth daily.     zaleplon (SONATA) 10 MG capsule Take 1 capsule (10 mg total) by mouth at bedtime as needed for sleep. 30 capsule 0   No current  facility-administered medications for this visit.    REVIEW OF SYSTEMS:    10 Point review of Systems was done is negative except as noted above.  PHYSICAL EXAMINATION: ECOG PERFORMANCE STATUS: 1 - Symptomatic but completely ambulatory  . Vitals:   12/25/21 1012  BP: 120/67  Pulse: 74  Resp: 17  Temp: (!) 97.5 F (36.4 C)  SpO2: 99%   Filed Weights   12/25/21 1012  Weight: 129 lb 11.2 oz (58.8 kg)   .Body mass index is 26.2 kg/m.  Marland Kitchen GENERAL:alert, in no acute distress and comfortable SKIN: no acute rashes, no significant lesions EYES: conjunctiva are pink and non-injected, sclera anicteric OROPHARYNX: MMM, no exudates, no oropharyngeal erythema or ulceration NECK:  supple, no JVD LYMPH:  no palpable lymphadenopathy in the cervical, axillary or inguinal regions LUNGS: clear to auscultation b/l with normal respiratory effort HEART: regular rate & rhythm ABDOMEN:  normoactive bowel sounds , non tender, not distended. Extremity: no pedal edema PSYCH: alert & oriented x 3 with fluent speech NEURO: no focal motor/sensory deficits   LABORATORY DATA:  I have reviewed the data as listed  . CBC Latest Ref Rng & Units 12/08/2021 12/04/2021 11/13/2021  WBC 4.0 - 10.5 K/uL 7.3 7.1 7.2  Hemoglobin 12.0 - 15.0 g/dL 11.8(L) 12.4 12.0  Hematocrit 36.0 - 46.0 % 36.5 39.5 36.1  Platelets 150 - 400 K/uL 383 384 322.0    . CMP Latest Ref Rng & Units 12/08/2021 12/04/2021 11/13/2021  Glucose 70 - 99 mg/dL 91 101(H) 84  BUN 6 - 20 mg/dL 15 13 16   Creatinine 0.44 - 1.00 mg/dL 0.71 0.65 0.60  Sodium 135 - 145 mmol/L 136 132(L) 132(L)  Potassium 3.5 - 5.1 mmol/L 3.9 3.7 4.0  Chloride 98 - 111 mmol/L 105 103 99  CO2 22 - 32 mmol/L 24 22 26   Calcium 8.9 - 10.3 mg/dL 8.8(L) 9.1 9.2  Total Protein 6.5 - 8.1 g/dL 8.2(H) - 7.8  Total Bilirubin 0.3 - 1.2 mg/dL 0.4 - 0.5  Alkaline Phos 38 - 126 U/L 66 - 54  AST 15 - 41 U/L 25 - 28  ALT 0 - 44 U/L 32 - 31   Component     Latest Ref  Rng & Units 09/16/2017 03/16/2018 10/12/2018 01/08/2019  Eosinophils Absolute     0.0 - 0.5 K/uL 1.4 (H) 1.4 (H) 2.4 (H) 1.1 (H)   Component     Latest Ref Rng & Units 11/09/2019 11/10/2020 11/13/2021 12/04/2021  Eosinophils Absolute     0.0 - 0.5 K/uL 0.9 (H) 0.9 (H) 1.2 (H) 1.0 (H)   Component     Latest Ref Rng & Units 12/08/2021  Eosinophils Absolute     0.0 - 0.5 K/uL 0.6 (H)   Component     Latest Ref Rng & Units 12/08/2021  IgG (Immunoglobin G), Serum     586 - 1,602 mg/dL 2,460 (H)  IgA     87 - 352 mg/dL 121  IgM (Immunoglobulin M), Srm     26 - 217 mg/dL 58  Total Protein ELP     6.0 - 8.5 g/dL 7.9  Albumin SerPl Elph-Mcnc     2.9 - 4.4 g/dL 3.9  Alpha 1     0.0 - 0.4 g/dL 0.2  Alpha2 Glob SerPl Elph-Mcnc     0.4 - 1.0 g/dL 0.8  B-Globulin SerPl Elph-Mcnc     0.7 - 1.3 g/dL 1.0  Gamma Glob SerPl Elph-Mcnc     0.4 - 1.8 g/dL 2.1 (H)  M Protein SerPl Elph-Mcnc     Not Observed g/dL 1.4 (H)  Globulin, Total     2.2 - 3.9 g/dL 4.0 (H)  Albumin/Glob SerPl     0.7 - 1.7 1.0  IFE 1      Comment (A)  Please Note (HCV):      Comment  MPN Specimen Type      BLOOD  MPN Cells Counted      Comment:  MPN Cells Analyzed      Comment:  MPN FISH Result      Comment:  MPN Interpretation      Comment:  MPN Director Review:      Comment:  RA Latex Turbid.     <  14.0 IU/mL <10.0  CCP Antibodies IgG/IgA     0 - 19 units 22 (H)  TSH     0.308 - 3.960 uIU/mL 0.734  T4,Free(Direct)     0.61 - 1.12 ng/dL 1.14 (H)  LDH     98 - 192 U/L 177  Sed Rate     0 - 22 mm/hr 35 (H)  Vitamin B12     180 - 914 pg/mL 1,264 (H)  Ferritin     11 - 307 ng/mL 94  HIV Screen 4th Generation wRfx     Non Reactive Non Reactive  Cortisol, Plasma     ug/dL 9.4   Multiple Myeloma Panel (SPEP&IFE w/QIG)      The value has a corrected status.      No reference range information available      Resulting Lab: [facility]      Comments:           (NOTE)            Immunofixation shows IgG monoclonal protein with lambda            light chain           specificity.  RADIOGRAPHIC STUDIES: I have personally reviewed the radiological images as listed and agreed with the findings in the report. No results found.  ASSESSMENT & PLAN:   61 year old with  1) Chronic Eosinophilia Patient has had some chronic nonprogressive eosinophilia ranging from an eosinophil count of [phone] 02 September 2017. She has associated generalized arthralgias intermittent joint swelling especially in her hands with morning stiffness. Also notes increasing fatigue and anorexia.  2) upper abdominal fullness and early satiety.  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 Patient has no anemia, renal insufficiency or hypercalcemia at this time.  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 -No evidence of primary hypereosinophilic myeloproliferative neoplasm with negative clonal markers NEGATIVE;  NO DELETION OF LNX1/CHIC2 OR REARRANGEMENT OF  PDGFRA, PDGFRB, OR FGFR1  -Concern for possible rheumatoid arthritis with positive anti-CCP antibodies and clinical presentation. -Rheumatology referral placed to see Dr Estanislado Pandy. -Patient is on short tapered dose of prednisone for respiratory allergies which are improved and this is also helped her fatigue and joint pains consistent with a possible inflammatory arthritis.  Will extend the taper set.  New prescription for prednisone sent.  10 mg daily for 14 days then 5 mg daily for 14 days and then 5 mg every other day for a month. -Still pending CT chest abdomen pelvis to evaluate for abdominal fullness and rule out other symptoms suggestive of lymphoma. -We discussed that the patient does have a monoclonal paraproteinemia with  an Ig G lambda M spike of 1.4 g/dL with absence of anemia renal insufficiency or hypercalcemia.  This could very well be related to her inflammatory state such as rheumatoid arthritis.  Bone marrow biopsy or work-up at this time might be confusing with a lot of reactive change.  We discussed pros and cons of additional work-up and decided to repeat myeloma panel in about a month after being on low-dose prednisone.  At that point if her M spike is still persistent she would likely need additional imaging studies and consideration of additional labs 24-hour UPEP and bone marrow biopsy to rule out a plasma cell disorder.  Follow-up CT chest  abdomen pelvis in 2 weeks Referral to rheumatology for possible rheumatoid arthritis Labs in 3 weeks MD visit in 4 weeks   All of the patients questions were answered with apparent satisfaction. The patient knows to call the clinic with any problems, questions or concerns.  . The total time spent in the appointment was 30 minutes reviewing lab results in detail with the patient, discussing meaning of her monoclonal paraproteinemia and work-up related to this, referral to rheumatology and coordination of care.   Sullivan Lone MD [place] AAHIVMS [facility] Hematology/Oncology Physician [facility]  (Office):       [phone] (Work cell):  [phone] (Fax):           [phone]

## 2022-01-12 ENCOUNTER — Encounter: Payer: Self-pay | Admitting: Hematology

## 2022-01-15 ENCOUNTER — Other Ambulatory Visit: Payer: Self-pay

## 2022-01-15 ENCOUNTER — Inpatient Hospital Stay: Payer: BC Managed Care – PPO | Attending: Hematology

## 2022-01-15 DIAGNOSIS — R7989 Other specified abnormal findings of blood chemistry: Secondary | ICD-10-CM | POA: Diagnosis not present

## 2022-01-15 DIAGNOSIS — R630 Anorexia: Secondary | ICD-10-CM | POA: Insufficient documentation

## 2022-01-15 DIAGNOSIS — Z96641 Presence of right artificial hip joint: Secondary | ICD-10-CM | POA: Insufficient documentation

## 2022-01-15 DIAGNOSIS — R5383 Other fatigue: Secondary | ICD-10-CM | POA: Diagnosis not present

## 2022-01-15 DIAGNOSIS — R1310 Dysphagia, unspecified: Secondary | ICD-10-CM | POA: Insufficient documentation

## 2022-01-15 DIAGNOSIS — D472 Monoclonal gammopathy: Secondary | ICD-10-CM | POA: Diagnosis not present

## 2022-01-15 DIAGNOSIS — M2559 Pain in other specified joint: Secondary | ICD-10-CM | POA: Diagnosis not present

## 2022-01-15 DIAGNOSIS — Z566 Other physical and mental strain related to work: Secondary | ICD-10-CM | POA: Diagnosis not present

## 2022-01-15 DIAGNOSIS — D721 Eosinophilia, unspecified: Secondary | ICD-10-CM | POA: Diagnosis not present

## 2022-01-15 DIAGNOSIS — F5102 Adjustment insomnia: Secondary | ICD-10-CM | POA: Insufficient documentation

## 2022-01-15 DIAGNOSIS — M2560 Stiffness of unspecified joint, not elsewhere classified: Secondary | ICD-10-CM | POA: Diagnosis not present

## 2022-01-15 LAB — CBC WITH DIFFERENTIAL/PLATELET
Abs Immature Granulocytes: 0.01 10*3/uL (ref 0.00–0.07)
Basophils Absolute: 0.1 10*3/uL (ref 0.0–0.1)
Basophils Relative: 1 %
Eosinophils Absolute: 0.1 10*3/uL (ref 0.0–0.5)
Eosinophils Relative: 1 %
HCT: 37.1 % (ref 36.0–46.0)
Hemoglobin: 12.2 g/dL (ref 12.0–15.0)
Immature Granulocytes: 0 %
Lymphocytes Relative: 12 %
Lymphs Abs: 0.8 10*3/uL (ref 0.7–4.0)
MCH: 28.1 pg (ref 26.0–34.0)
MCHC: 32.9 g/dL (ref 30.0–36.0)
MCV: 85.5 fL (ref 80.0–100.0)
Monocytes Absolute: 0.2 10*3/uL (ref 0.1–1.0)
Monocytes Relative: 3 %
Neutro Abs: 5.2 10*3/uL (ref 1.7–7.7)
Neutrophils Relative %: 83 %
Platelets: 349 10*3/uL (ref 150–400)
RBC: 4.34 MIL/uL (ref 3.87–5.11)
RDW: 13.6 % (ref 11.5–15.5)
WBC: 6.3 10*3/uL (ref 4.0–10.5)
nRBC: 0 % (ref 0.0–0.2)

## 2022-01-15 LAB — CMP (CANCER CENTER ONLY)
ALT: 28 U/L (ref 0–44)
AST: 25 U/L (ref 15–41)
Albumin: 4.3 g/dL (ref 3.5–5.0)
Alkaline Phosphatase: 53 U/L (ref 38–126)
Anion gap: 9 (ref 5–15)
BUN: 24 mg/dL — ABNORMAL HIGH (ref 6–20)
CO2: 22 mmol/L (ref 22–32)
Calcium: 9.4 mg/dL (ref 8.9–10.3)
Chloride: 104 mmol/L (ref 98–111)
Creatinine: 0.69 mg/dL (ref 0.44–1.00)
GFR, Estimated: >60 mL/min (ref >60)
Glucose, Bld: 121 mg/dL — ABNORMAL HIGH (ref 70–99)
Potassium: 3.6 mmol/L (ref 3.5–5.1)
Sodium: 135 mmol/L (ref 135–145)
Total Bilirubin: 0.4 mg/dL (ref 0.3–1.2)
Total Protein: 8.3 g/dL — ABNORMAL HIGH (ref 6.5–8.1)

## 2022-01-15 LAB — SEDIMENTATION RATE: Sed Rate: 28 mm/hr — ABNORMAL HIGH (ref 0–22)

## 2022-01-15 LAB — LACTATE DEHYDROGENASE: LDH: 174 U/L (ref 98–192)

## 2022-01-16 LAB — BETA 2 MICROGLOBULIN, SERUM: Beta-2 Microglobulin: 1.2 mg/L (ref 0.6–2.4)

## 2022-01-18 DIAGNOSIS — M2559 Pain in other specified joint: Secondary | ICD-10-CM | POA: Diagnosis not present

## 2022-01-18 DIAGNOSIS — F5102 Adjustment insomnia: Secondary | ICD-10-CM | POA: Diagnosis not present

## 2022-01-18 DIAGNOSIS — M2560 Stiffness of unspecified joint, not elsewhere classified: Secondary | ICD-10-CM | POA: Diagnosis not present

## 2022-01-18 DIAGNOSIS — D721 Eosinophilia, unspecified: Secondary | ICD-10-CM | POA: Diagnosis not present

## 2022-01-18 DIAGNOSIS — Z96641 Presence of right artificial hip joint: Secondary | ICD-10-CM | POA: Diagnosis not present

## 2022-01-18 DIAGNOSIS — R7989 Other specified abnormal findings of blood chemistry: Secondary | ICD-10-CM | POA: Diagnosis not present

## 2022-01-18 DIAGNOSIS — D472 Monoclonal gammopathy: Secondary | ICD-10-CM | POA: Diagnosis not present

## 2022-01-18 DIAGNOSIS — Z566 Other physical and mental strain related to work: Secondary | ICD-10-CM | POA: Diagnosis not present

## 2022-01-18 DIAGNOSIS — R5383 Other fatigue: Secondary | ICD-10-CM | POA: Diagnosis not present

## 2022-01-18 DIAGNOSIS — R630 Anorexia: Secondary | ICD-10-CM | POA: Diagnosis not present

## 2022-01-18 DIAGNOSIS — R1310 Dysphagia, unspecified: Secondary | ICD-10-CM | POA: Diagnosis not present

## 2022-01-18 LAB — KAPPA/LAMBDA LIGHT CHAINS
Kappa free light chain: 10.4 mg/L (ref 3.3–19.4)
Kappa, lambda light chain ratio: 0.49 (ref 0.26–1.65)
Lambda free light chains: 21.3 mg/L (ref 5.7–26.3)

## 2022-01-19 LAB — MULTIPLE MYELOMA PANEL, SERUM
Albumin SerPl Elph-Mcnc: 3.4 g/dL (ref 2.9–4.4)
Albumin/Glob SerPl: 0.8 (ref 0.7–1.7)
Alpha 1: 0.3 g/dL (ref 0.0–0.4)
Alpha2 Glob SerPl Elph-Mcnc: 0.8 g/dL (ref 0.4–1.0)
B-Globulin SerPl Elph-Mcnc: 1 g/dL (ref 0.7–1.3)
Gamma Glob SerPl Elph-Mcnc: 2.1 g/dL — ABNORMAL HIGH (ref 0.4–1.8)
Globulin, Total: 4.3 g/dL — ABNORMAL HIGH (ref 2.2–3.9)
IgA: 110 mg/dL (ref 87–352)
IgG (Immunoglobin G), Serum: 2317 mg/dL — ABNORMAL HIGH (ref 586–1602)
IgM (Immunoglobulin M), Srm: 55 mg/dL (ref 26–217)
M Protein SerPl Elph-Mcnc: 1.4 g/dL — ABNORMAL HIGH
Total Protein ELP: 7.7 g/dL (ref 6.0–8.5)

## 2022-01-20 LAB — UPEP/UIFE/LIGHT CHAINS/TP, 24-HR UR
% BETA, Urine: 44 %
ALPHA 1 URINE: 5.5 %
Albumin, U: 21.7 %
Alpha 2, Urine: 14.1 %
Free Kappa Lt Chains,Ur: 5.28 mg/L (ref 1.17–86.46)
Free Kappa/Lambda Ratio: 0.77 — ABNORMAL LOW (ref 1.83–14.26)
Free Lambda Lt Chains,Ur: 6.89 mg/L (ref 0.27–15.21)
GAMMA GLOBULIN URINE: 14.6 %
M-SPIKE %, Urine: 19.6 % — ABNORMAL HIGH
M-Spike, Mg/24 Hr: 15 mg/24 hr — ABNORMAL HIGH
Total Protein, Urine-Ur/day: 78 mg/24 hr (ref 30–150)
Total Protein, Urine: 5.8 mg/dL
Total Volume: 1350

## 2022-01-22 ENCOUNTER — Other Ambulatory Visit: Payer: Self-pay

## 2022-01-22 ENCOUNTER — Inpatient Hospital Stay: Payer: BC Managed Care – PPO | Admitting: Hematology

## 2022-01-22 VITALS — BP 143/76 | HR 65 | Temp 97.9°F | Resp 20 | Wt 131.9 lb

## 2022-01-22 DIAGNOSIS — F5102 Adjustment insomnia: Secondary | ICD-10-CM | POA: Diagnosis not present

## 2022-01-22 DIAGNOSIS — R5383 Other fatigue: Secondary | ICD-10-CM | POA: Diagnosis not present

## 2022-01-22 DIAGNOSIS — M2560 Stiffness of unspecified joint, not elsewhere classified: Secondary | ICD-10-CM | POA: Diagnosis not present

## 2022-01-22 DIAGNOSIS — D472 Monoclonal gammopathy: Secondary | ICD-10-CM

## 2022-01-22 DIAGNOSIS — M2559 Pain in other specified joint: Secondary | ICD-10-CM | POA: Diagnosis not present

## 2022-01-22 DIAGNOSIS — Z566 Other physical and mental strain related to work: Secondary | ICD-10-CM | POA: Diagnosis not present

## 2022-01-22 DIAGNOSIS — R1310 Dysphagia, unspecified: Secondary | ICD-10-CM | POA: Diagnosis not present

## 2022-01-22 DIAGNOSIS — Z96641 Presence of right artificial hip joint: Secondary | ICD-10-CM | POA: Diagnosis not present

## 2022-01-22 DIAGNOSIS — D721 Eosinophilia, unspecified: Secondary | ICD-10-CM | POA: Diagnosis not present

## 2022-01-22 DIAGNOSIS — R630 Anorexia: Secondary | ICD-10-CM | POA: Diagnosis not present

## 2022-01-22 DIAGNOSIS — R7989 Other specified abnormal findings of blood chemistry: Secondary | ICD-10-CM | POA: Diagnosis not present

## 2022-01-28 NOTE — Progress Notes (Addendum)
Marland Kitchen   HEMATOLOGY/ONCOLOGY CLINIC NOTE  Date of Service:.01/22/2022   Patient Care Team: Binnie Rail, MD as PCP - General (Internal Medicine) Paralee Cancel, MD (Orthopedic Surgery) Deneise Lever, MD (Pulmonary Disease)  CHIEF COMPLAINTS/PURPOSE OF CONSULTATION:  Chronic eosinophilia Monoclonal paraproteinemia  HISTORY OF PRESENTING ILLNESS:   Maria Barker is a wonderful 61 y.o. female who has been referred to [place] by Dr .Quay Burow, Claudina Lick, MD  for evaluation and management of chronic eosinophilia rule out hypereosinophilic syndrome. Patient speaks some English but her daughter is also available with her to help with some translation.  Patient has a history of arthritis status post right total hip replacement in 2011 (she is unclear if this was just osteoarthritis or if there is any other form of inflammatory arthritis), hypertension, dyslipidemia, hypothyroidism, chronic insomnia who works as a Glass blower/designer.  Patient recently had labs on 11/13/2021 with her primary care physician that showed absolute eosinophil count of 1200 making up 17% of her total WBC count of 7.2k.  She had normal hemoglobin of 12 and normal platelets of 322k Review of available labs in our system shows the patient has had chronic eosinophilia since at least September 2018 with eosinophil counts ranging from [phone].  Her eosinophil counts have not shown a progressive increase. She notes generalized arthralgias especially in her hands knees and feet.  She does have joint stiffness lasting about 30 to 60 minutes in the morning. She does not have much detail about the need for a right total hip replacement at a relatively young age of 80yr  Is not aware of any family history of autoimmune conditions like rheumatoid arthritis or lupus. She notes significant increase in fatigue over the last 2 to 3 months with decreased appetite. She is unable to quantify any specific weight loss She does note some increased  abdominal fullness in her upper abdomen. No overt change in bowel habits.  No skin rashes.  No other acute new focal symptoms. No bone pains no fevers no chills no night sweats. Notes chronic stress at work and chronic insomnia likely related to her shift work. Denies any overt respiratory allergies skin allergies or other seasonal allergies. No recent travel outside [place] last 20 to 30 [place]. Denies any hiking camping or using untreated lake or stream water. No history of blood transfusions. No history of high risk sexual behavior.  INTERVAL HISTORY  Maria TChaniqua Brisbyis here for follow-up for her eosinophilia and monoclonal paraproteinemia. She notes that her joint pain and fatigue has significantly improved after having been treated with prednisone for a few weeks.  We discussed tapering her off of her prednisone.  She is still having some dysphagia with swallowing solid foods.  I offered to give her a GI referral she would like to follow-up with her primary care physician regarding this.  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  Patient reports no fevers no chills no night sweats.  No new focal bone pains.  Still awaiting rheumatology appointment.   MEDICAL HISTORY:   Past Medical History:  Diagnosis Date   Arthritis    R THR 2011   Back pain    Frequent headaches    Hyperlipidemia LDL goal < 130    Hypertension    Hypothyroid 02/06/2015   Dx 01/2015: TSH 5.4   Insomnia    Left hip pain    Pain of left thigh     SURGICAL HISTORY: Past Surgical History:  Procedure Laterality Date   TOTAL HIP ARTHROPLASTY Right 03/31/10   olin   TOTAL HIP ARTHROPLASTY Left 03/08/2016   Procedure: TOTAL HIP ARTHROPLASTY;  Surgeon: Frederik Pear, MD;  Location: [facility];  Service: Orthopedics;  Laterality: Left;   TUBAL LIGATION     UPPER GASTROINTESTINAL ENDOSCOPY      SOCIAL HISTORY: Social History   Socioeconomic History   Marital status: Married    Spouse name: Not on file   Number of children: 5   Years of education: Not on file   Highest education level: Not on file  Occupational History   Occupation: Glass blower/designer    Employer: [employer]  Tobacco Use   Smoking status: Never   Smokeless tobacco: Never  Substance and Sexual Activity   Alcohol use: No   Drug use: No   Sexual activity: Not on file  Other Topics Concern   Not on file  Social History Narrative   Not on file   Social Determinants of Health   Financial Resource Strain: Not on file  Food Insecurity: Not on file  Transportation Needs: Not on file  Physical Activity: Not on file  Stress: Not on file  Social Connections: Not on file  Intimate Partner Violence: Not on file    FAMILY HISTORY: Family History  Problem Relation Age of Onset   CVA Mother    Hypertension Mother     ALLERGIES:  is allergic to asa [aspirin] and ibuprofen.  MEDICATIONS:  Current Outpatient Medications  Medication Sig Dispense Refill   melatonin 5 MG TABS Take 5 mg by mouth at bedtime.     atorvastatin (LIPITOR) 10 MG tablet TAKE 1 TABLET BY MOUTH ONCE DAILY(MUST SEE DOCTOR FOR REFILLS ANNUAL APPT IN NOV) 90 tablet 1   Calcium Carbonate-Vitamin D (CALCIUM-VITAMIN D3 PO) Take 1 each by mouth 2  (two) times daily.     Cetirizine HCl 10 MG CAPS Take 1 capsule (10 mg total) by mouth daily for 10 days. 10 capsule 0   estradiol (ESTRACE) 0.1 MG/GM vaginal cream Place vaginally.     levothyroxine (EUTHYROX) 50 MCG tablet TAKE 1 TABLET BY MOUTH ONCE DAILY 6  DAYS  A  WEEK 90 tablet 2   olmesartan (BENICAR) 5 MG tablet TAKE 1 TABLET BY MOUTH ONCE DAILY . 90 tablet 1   Omega-3 1000 MG CAPS Take 2,000 mg by mouth.     omeprazole (PRILOSEC) 20 MG capsule TAKE 1 CAPSULE BY MOUTH ONCE DAILY 30  MINUTES  PRIOR  TO  A  MEAL 30 capsule 0   predniSONE (DELTASONE) 10 MG tablet Take 1 tablet (10 mg total) by mouth daily with breakfast for 14 days, THEN 0.5 tablets (5 mg total) daily with breakfast for 14 days, THEN 0.5 tablets (5 mg total) every other day. (Patient not  taking: Reported on 01/22/2022) 30 tablet 0   vitamin B-12 (CYANOCOBALAMIN) 1000 MCG tablet Take 1,000 mcg by mouth daily.     zaleplon (SONATA) 10 MG capsule Take 1 capsule (10 mg total) by mouth at bedtime as needed for sleep. 30 capsule 0   No current facility-administered medications for this visit.    REVIEW OF SYSTEMS:    .10 Point review of Systems was done is negative except as noted above.   PHYSICAL EXAMINATION: ECOG PERFORMANCE STATUS: 1 - Symptomatic but completely ambulatory  . Vitals:   01/22/22 1128  BP: (!) 143/76  Pulse: 65  Resp: 20  Temp: 97.9 F (36.6 C)  SpO2: 100%   Filed Weights   01/22/22 1128  Weight: 131 lb 14.4 oz (59.8 kg)   .Body mass index is 26.64 kg/m.   LABORATORY DATA:  I have reviewed the data as listed  . CBC Latest Ref Rng & Units 01/15/2022 12/08/2021 12/04/2021  WBC 4.0 - 10.5 K/uL 6.3 7.3 7.1  Hemoglobin 12.0 - 15.0 g/dL 12.2 11.8(L) 12.4  Hematocrit 36.0 - 46.0 % 37.1 36.5 39.5  Platelets 150 - 400 K/uL 349 383 384    . CMP Latest Ref Rng & Units 01/15/2022 12/08/2021 12/04/2021  Glucose 70 - 99 mg/dL 121(H) 91 101(H)  BUN 6 - 20 mg/dL 24(H) 15 13  Creatinine 0.44 - 1.00  mg/dL 0.69 0.71 0.65  Sodium 135 - 145 mmol/L 135 136 132(L)  Potassium 3.5 - 5.1 mmol/L 3.6 3.9 3.7  Chloride 98 - 111 mmol/L 104 105 103  CO2 22 - 32 mmol/L _0 Calcium 8.9 - 10.3 mg/dL 9.4 8.8(L) 9.1  Total Protein 6.5 - 8.1 g/dL 8.3(H) 8.2(H) -  Total Bilirubin 0.3 - 1.2 mg/dL 0.4 0.4 -  Alkaline Phos 38 - 126 U/L 53 66 -  AST 15 - 41 U/L 25 25 -  ALT 0 - 44 U/L 28 32 -   Component     Latest Ref Rng & Units 09/16/2017 03/16/2018 10/12/2018 01/08/2019  Eosinophils Absolute     0.0 - 0.5 K/uL 1.4 (H) 1.4 (H) 2.4 (H) 1.1 (H)   Component     Latest Ref Rng & Units 11/09/2019 11/10/2020 11/13/2021 12/04/2021  Eosinophils Absolute     0.0 - 0.5 K/uL 0.9 (H) 0.9 (H) 1.2 (H) 1.0 (H)   Component     Latest Ref Rng & Units 12/08/2021  Eosinophils Absolute     0.0 - 0.5 K/uL 0.6 (H)   Component     Latest Ref Rng & Units 12/08/2021  IgG (Immunoglobin G), Serum     586 - 1,602 mg/dL 2,460 (H)  IgA     87 - 352 mg/dL 121  IgM (Immunoglobulin M), Srm     26 - 217 mg/dL 58  Total Protein ELP     6.0 - 8.5 g/dL 7.9  Albumin SerPl Elph-Mcnc     2.9 - 4.4 g/dL 3.9  Alpha 1     0.0 - 0.4 g/dL 0.2  Alpha2 Glob SerPl Elph-Mcnc     0.4 - 1.0 g/dL 0.8  B-Globulin SerPl Elph-Mcnc     0.7 - 1.3 g/dL 1.0  Gamma Glob SerPl Elph-Mcnc     0.4 - 1.8 g/dL 2.1 (H)  M Protein SerPl Elph-Mcnc     Not Observed g/dL 1.4 (H)  Globulin, Total     2.2 - 3.9 g/dL 4.0 (H)  Albumin/Glob SerPl     0.7 - 1.7  1.0  IFE 1      Comment (A)  Please Note (HCV):      Comment  MPN Specimen Type      BLOOD  MPN Cells Counted      Comment:  MPN Cells Analyzed      Comment:  MPN FISH Result      Comment:  MPN Interpretation      Comment:  MPN Director Review:      Comment:  RA Latex Turbid.     <14.0 IU/mL <10.0  CCP Antibodies IgG/IgA     0 - 19 units 22 (H)  TSH     0.308 - 3.960 uIU/mL 0.734  T4,Free(Direct)     0.61 - 1.12 ng/dL 1.14 (H)  LDH     98 - 192 U/L 177  Sed Rate     0 -  22 mm/hr 35 (H)  Vitamin B12     180 - 914 pg/mL 1,264 (H)  Ferritin     11 - 307 ng/mL 94  HIV Screen 4th Generation wRfx     Non Reactive Non Reactive  Cortisol, Plasma     ug/dL 9.4   Multiple Myeloma Panel (SPEP&IFE w/QIG)      The value has a corrected status.      No reference range information available      Resulting Lab: [facility]      Comments:           (NOTE)           Immunofixation shows IgG monoclonal protein with lambda            light chain           specificity.  RADIOGRAPHIC STUDIES: I have personally reviewed the radiological images as listed and agreed with the findings in the report. No results found.  ASSESSMENT & PLAN:   61 year old with  1) Chronic Eosinophilia-now resolved with steroids.  Likely reactive and probably related to possible rheumatoid arthritis. Patient has had some chronic nonprogressive eosinophilia ranging from an eosinophil count of [phone] 02 September 2017. She has associated generalized arthralgias intermittent joint swelling especially in her hands with morning stiffness. Also notes increasing fatigue and anorexia. 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 -Her eosinophilia has resolved with low-dose prednisone and this suggests a likely reactive etiology possibly from rheumatoid arthritis. -She has been given a referral to Dr. Estanislado Pandy for further evaluation and management of this. -She has no evidence of clonal eosinophilia at this time to suggest a bone marrow problem. -Prednisone has been tapered off.  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 Patient has no anemia, renal insufficiency or hypercalcemia at this time. PLAN -M spike on repeat labs remains unchanged -PET CT scan to evaluate for possible bone lesions.  4) abdominal fullness and dysphagia with solids -Patient will need a referral to gastroenterology for possible EGD.  I offered a GI referral. -Patient and her daughter prefers to discuss this with her primary care physician.  We discussed that they will need to follow-up with primary care physician and discuss this to obtain a GI referral. -CT chest abdomen pelvis was declined  by her insurance company. -We have ordered a PET CT scan to evaluate her monoclonal paraproteinemia which hopefully will also look at her abdomen.  Follow-up PET/CT in 2 weeks (canceling CT) Phone visit with Dr Irene Limbo in 3 weeks   All of the patients questions were answered with apparent satisfaction. The patient knows to call the clinic with any problems, questions or concerns.   Sullivan Lone MD MS AAHIVMS [facility] Hematology/Oncology Physician [facility]    .Marland Kitchen

## 2022-02-08 ENCOUNTER — Ambulatory Visit (HOSPITAL_COMMUNITY)
Admission: RE | Admit: 2022-02-08 | Discharge: 2022-02-08 | Disposition: A | Discharge status: Home / Self Care | Payer: BC Managed Care – PPO | Source: Ambulatory Visit | Attending: Hematology | Admitting: Hematology

## 2022-02-08 ENCOUNTER — Other Ambulatory Visit: Payer: Self-pay

## 2022-02-08 DIAGNOSIS — D472 Monoclonal gammopathy: Secondary | ICD-10-CM | POA: Diagnosis not present

## 2022-02-08 LAB — GLUCOSE, CAPILLARY: Glucose-Capillary: 103 mg/dL — ABNORMAL HIGH (ref 70–99)

## 2022-02-08 MED ORDER — FLUDEOXYGLUCOSE F - 18 (FDG) INJECTION
6.5000 | Freq: Once | INTRAVENOUS | Status: AC
  Administered 2022-02-08: 6.5 via INTRAVENOUS

## 2022-02-12 ENCOUNTER — Inpatient Hospital Stay: Payer: BC Managed Care – PPO | Attending: Hematology | Admitting: Hematology

## 2022-02-12 ENCOUNTER — Other Ambulatory Visit: Payer: Self-pay

## 2022-02-12 ENCOUNTER — Ambulatory Visit: Payer: BC Managed Care – PPO | Admitting: Hematology

## 2022-02-12 DIAGNOSIS — D721 Eosinophilia, unspecified: Secondary | ICD-10-CM | POA: Diagnosis not present

## 2022-02-12 DIAGNOSIS — R1310 Dysphagia, unspecified: Secondary | ICD-10-CM | POA: Diagnosis not present

## 2022-02-12 DIAGNOSIS — Z7952 Long term (current) use of systemic steroids: Secondary | ICD-10-CM | POA: Diagnosis not present

## 2022-02-12 DIAGNOSIS — Z862 Personal history of diseases of the blood and blood-forming organs and certain disorders involving the immune mechanism: Secondary | ICD-10-CM | POA: Diagnosis not present

## 2022-02-12 DIAGNOSIS — D472 Monoclonal gammopathy: Secondary | ICD-10-CM | POA: Diagnosis not present

## 2022-02-12 DIAGNOSIS — M069 Rheumatoid arthritis, unspecified: Secondary | ICD-10-CM | POA: Insufficient documentation

## 2022-02-16 ENCOUNTER — Telehealth: Payer: Self-pay | Admitting: Hematology

## 2022-02-16 NOTE — Progress Notes (Signed)
Marland Kitchen   HEMATOLOGY/ONCOLOGY PHONE VISIT  NOTE  Date of Service:.02/12/2022   Patient Care Team: Binnie Rail, MD as PCP - General (Internal Medicine) Paralee Cancel, MD (Orthopedic Surgery) Deneise Lever, MD (Pulmonary Disease)  CHIEF COMPLAINTS/PURPOSE OF CONSULTATION:  Discussion of PET CT scan done for monoclonal paraproteinemia HISTORY OF PRESENTING ILLNESS:  Please see previous note for details on initial presentation.  INTERVAL HISTORY  .I connected with Maria Barker on 02/16/22 at 12:20 PM EST by telephone visit and verified that I am speaking with the correct person using two identifiers.   I discussed the limitations, risks, security and privacy concerns of performing an evaluation and management service by telemedicine and the availability of in-person appointments. I also discussed with the patient that there may be a patient responsible charge related to this service. The patient expressed understanding and agreed to proceed.   Other persons participating in the visit and their role in the encounter: Patient's daughter  Patients location: Home Providers location: [place]  Chief Complaint: Discussion of PET CT scan Patient notes no new symptoms since her last clinic visit.  She notes that she is starting to have some increase in arthralgias since she has been off the prednisone and is awaiting her rheumatology appointment.  She notes taking intermittent ibuprofen helps. Her PET CT scan done on 02/08/2022 showed no suspicious hypermetabolic soft tissue or osseous uptake.  No evidence of intra or extraosseous myeloma. No evidence of abnormal hypermetabolic activity within the abdomen or chest.   MEDICAL HISTORY:  Past Medical History:  Diagnosis Date   Arthritis    R THR 2011   Back pain    Frequent headaches    Hyperlipidemia LDL goal < 130    Hypertension    Hypothyroid 02/06/2015   Dx 01/2015: TSH 5.4   Insomnia    Left hip pain    Pain of  left thigh     SURGICAL HISTORY: Past Surgical History:  Procedure Laterality Date   TOTAL HIP ARTHROPLASTY Right 03/31/10   olin   TOTAL HIP ARTHROPLASTY Left 03/08/2016   Procedure: TOTAL HIP ARTHROPLASTY;  Surgeon: Frederik Pear, MD;  Location: [place];  Service: Orthopedics;  Laterality: Left;   TUBAL LIGATION     UPPER GASTROINTESTINAL ENDOSCOPY      SOCIAL HISTORY: Social History   Socioeconomic History   Marital status: Married    Spouse name: Not on file   Number of children: 5   Years of education: Not on file   Highest education level: Not on file  Occupational History   Occupation: Glass blower/designer    Employer: [employer]  Tobacco Use   Smoking status: Never   Smokeless tobacco: Never  Substance and Sexual Activity   Alcohol use: No   Drug use: No   Sexual activity: Not on file  Other Topics Concern   Not on file  Social History Narrative   Not on file   Social Determinants of Health   Financial Resource Strain: Not on file  Food Insecurity: Not on file  Transportation Needs: Not on file  Physical Activity: Not on file  Stress: Not on file  Social Connections: Not on file  Intimate Partner Violence: Not on file    FAMILY HISTORY: Family History  Problem Relation Age of Onset   CVA Mother    Hypertension Mother     ALLERGIES:  is allergic to asa [aspirin] and ibuprofen.  MEDICATIONS:  Current Outpatient Medications  Medication Sig Dispense Refill   atorvastatin (LIPITOR) 10 MG tablet TAKE 1 TABLET BY MOUTH ONCE DAILY(MUST SEE DOCTOR FOR REFILLS ANNUAL APPT IN NOV) 90 tablet 1   Calcium Carbonate-Vitamin D (CALCIUM-VITAMIN D3 PO) Take 1 each by mouth 2 (two) times daily.     Cetirizine HCl 10 MG CAPS Take 1 capsule (10 mg total) by mouth daily for 10 days. 10 capsule 0   estradiol (ESTRACE) 0.1 MG/GM vaginal cream Place vaginally.     levothyroxine (EUTHYROX) 50 MCG tablet TAKE 1 TABLET BY MOUTH ONCE DAILY 6  DAYS  A  WEEK 90 tablet 2    melatonin 5 MG TABS Take 5 mg by mouth at bedtime.     olmesartan (BENICAR) 5 MG tablet TAKE 1 TABLET BY MOUTH ONCE DAILY . 90 tablet 1   Omega-3 1000 MG CAPS Take 2,000 mg by mouth.     omeprazole (PRILOSEC) 20 MG capsule TAKE 1 CAPSULE BY MOUTH ONCE DAILY 30  MINUTES  PRIOR  TO  A  MEAL 30 capsule 0   predniSONE (DELTASONE) 10 MG tablet Take 1 tablet (10 mg total) by mouth daily with breakfast for 14 days, THEN 0.5 tablets (5 mg total) daily with breakfast for 14 days, THEN 0.5 tablets (5 mg total) every other day. (Patient not taking: Reported on 01/22/2022) 30 tablet 0   vitamin B-12 (CYANOCOBALAMIN) 1000 MCG tablet Take 1,000 mcg by mouth daily.     zaleplon (SONATA) 10 MG capsule Take 1 capsule (10 mg total) by mouth at bedtime as needed for sleep. 30 capsule 0   No current facility-administered medications for this visit.    PHYSICAL EXAMINATION:  LABORATORY DATA:  I have reviewed the data as listed  . CBC Latest Ref Rng & Units 01/15/2022 12/08/2021 12/04/2021  WBC 4.0 - 10.5 K/uL 6.3 7.3 7.1  Hemoglobin 12.0 - 15.0 g/dL 12.2 11.8(L) 12.4  Hematocrit 36.0 - 46.0 % 37.1 36.5 39.5  Platelets 150 - 400 K/uL 349 383 384    . CMP Latest Ref Rng & Units 01/15/2022 12/08/2021 12/04/2021  Glucose 70 - 99 mg/dL 121(H) 91 101(H)  BUN 6 - 20 mg/dL 24(H) 15 13  Creatinine 0.44 - 1.00 mg/dL 0.69 0.71 0.65  Sodium 135 - 145 mmol/L 135 136 132(L)  Potassium 3.5 - 5.1 mmol/L 3.6 3.9 3.7  Chloride 98 - 111 mmol/L 104 105 103  CO2 22 - 32 mmol/L _0 Calcium 8.9 - 10.3 mg/dL 9.4 8.8(L) 9.1  Total Protein 6.5 - 8.1 g/dL 8.3(H) 8.2(H) -  Total Bilirubin 0.3 - 1.2 mg/dL 0.4 0.4 -  Alkaline Phos 38 - 126 U/L 53 66 -  AST 15 - 41 U/L 25 25 -  ALT 0 - 44 U/L 28 32 -   Component     Latest Ref Rng & Units 09/16/2017 03/16/2018 10/12/2018 01/08/2019  Eosinophils Absolute     0.0 - 0.5 K/uL 1.4 (H) 1.4 (H) 2.4 (H) 1.1 (H)   Component     Latest Ref Rng & Units 11/09/2019 11/10/2020 11/13/2021  12/04/2021  Eosinophils Absolute     0.0 - 0.5 K/uL 0.9 (H) 0.9 (H) 1.2 (H) 1.0 (H)   Component     Latest Ref Rng & Units 12/08/2021  Eosinophils Absolute     0.0 - 0.5 K/uL 0.6 (H)   Component     Latest Ref Rng & Units 12/08/2021  IgG (Immunoglobin G), Serum     586 - 1,602 mg/dL  2,460 (H)  IgA     87 - 352 mg/dL 121  IgM (Immunoglobulin M), Srm     26 - 217 mg/dL 58  Total Protein ELP     6.0 - 8.5 g/dL 7.9  Albumin SerPl Elph-Mcnc     2.9 - 4.4 g/dL 3.9  Alpha 1     0.0 - 0.4 g/dL 0.2  Alpha2 Glob SerPl Elph-Mcnc     0.4 - 1.0 g/dL 0.8  B-Globulin SerPl Elph-Mcnc     0.7 - 1.3 g/dL 1.0  Gamma Glob SerPl Elph-Mcnc     0.4 - 1.8 g/dL 2.1 (H)  M Protein SerPl Elph-Mcnc     Not Observed g/dL 1.4 (H)  Globulin, Total     2.2 - 3.9 g/dL 4.0 (H)  Albumin/Glob SerPl     0.7 - 1.7 1.0  IFE 1      Comment (A)  Please Note (HCV):      Comment  MPN Specimen Type      BLOOD  MPN Cells Counted      Comment:  MPN Cells Analyzed      Comment:  MPN FISH Result      Comment:  MPN Interpretation      Comment:  MPN Director Review:      Comment:  RA Latex Turbid.     <14.0 IU/mL <10.0  CCP Antibodies IgG/IgA     0 - 19 units 22 (H)  TSH     0.308 - 3.960 uIU/mL 0.734  T4,Free(Direct)     0.61 - 1.12 ng/dL 1.14 (H)  LDH     98 - 192 U/L 177  Sed Rate     0 - 22 mm/hr 35 (H)  Vitamin B12     180 - 914 pg/mL 1,264 (H)  Ferritin     11 - 307 ng/mL 94  HIV Screen 4th Generation wRfx     Non Reactive Non Reactive  Cortisol, Plasma     ug/dL 9.4   Multiple Myeloma Panel (SPEP&IFE w/QIG)      The value has a corrected status.      No reference range information available      Resulting Lab: [facility]      Comments:           (NOTE)           Immunofixation shows IgG monoclonal protein with lambda            light chain           specificity.  RADIOGRAPHIC STUDIES: I have personally reviewed the radiological images as listed and agreed  with the findings in the report. NM PET Image Initial (PI) Whole Body  Result Date: 02/09/2022 CLINICAL DATA:  Initial treatment strategy for monoclonal paraproteinemia concerning for myeloma. EXAM: NUCLEAR MEDICINE PET WHOLE BODY TECHNIQUE: 6.5 mCi F-18 FDG was injected intravenously. Full-ring PET imaging was performed from the head to foot after the radiotracer. CT data was obtained and used for attenuation correction and anatomic localization. Fasting blood glucose: 103 mg/dl COMPARISON:  None. FINDINGS: Mediastinal blood pool activity: SUV max 2.3 HEAD/NECK: No hypermetabolic activity in the scalp. No hypermetabolic cervical lymph nodes. Incidental CT findings: none CHEST: No hypermetabolic mediastinal or hilar nodes. No suspicious pulmonary nodules on the CT scan. Incidental CT findings: Subtle tree-in-bud nodularity noted left lower lobe (image 29/8). ABDOMEN/PELVIS: No abnormal hypermetabolic activity within the liver, pancreas, adrenal glands, or spleen. No hypermetabolic lymph nodes in  the abdomen or pelvis. 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 Anatomy in the inferior pelvis obscured by beam hardening artifact from bilateral hip replacement. SKELETON: No focal hypermetabolic activity to suggest skeletal metastasis. Symmetric low level uptake in the L3-4 facets is likely degenerative. Incidental CT findings: Status post bilateral hip replacement. Tiny sclerotic focus in the T6 vertebral body demonstrates no hypermetabolism. EXTREMITIES: No abnormal hypermetabolic activity in the lower extremities. Incidental CT findings: none IMPRESSION: 1. No suspicious hypermetabolic soft tissue or osseous uptake on today's study. 2. 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 Consider follow-up CT abdomen in 1 year to ensure stability.  Electronically Signed   By: Misty Stanley M.D.   On: 02/09/2022 08:53    ASSESSMENT & PLAN:   61 year old with  1) Chronic Eosinophilia-now resolved with steroids.  Likely reactive and probably related to possible rheumatoid arthritis. Patient has had some chronic nonprogressive eosinophilia ranging from an eosinophil count of [phone] 02 September 2017. She has associated generalized arthralgias intermittent joint swelling especially in her hands with morning stiffness. Also notes increasing fatigue and anorexia. 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 -Her eosinophilia has resolved with low-dose prednisone and this suggests a likely reactive etiology possibly from rheumatoid arthritis. -She has been given a referral to Dr. Estanislado Pandy for further evaluation and management of this. -She has no evidence of clonal eosinophilia at this time to suggest a bone marrow problem. -Prednisone has been tapered off.  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 Patient has no anemia, renal insufficiency or hypercalcemia at this time. PLAN -M spike on repeat labs remains unchanged -PET CT scan done 02/09/2022 were discussed in detail and showed no evidence of intra osseous or extraosseous lesions suggestive of multiple myeloma.  4) abdominal fullness and dysphagia with solids -Patient will need a referral to gastroenterology for possible EGD.  I offered a GI referral. -Patient and her daughter prefers to discuss this with her primary care physician.  We discussed that they will need to follow-up with  primary care physician and discuss this to obtain a GI referral. -PET CT scan does not show any overt hypermetabolic lesions in her chest or abdomen no lymphadenopathy or hepatosplenomegaly which is reassuring against a myeloproliferative neoplasm or other tumor.  Follow-up Labs in 18 weeks Return to clinic with Dr. Irene Limbo in 19 weeks   All of the patients questions were answered with apparent satisfaction. The patient knows to call the clinic with any problems, questions or concerns.   Sullivan Lone MD MS AAHIVMS [facility] Hematology/Oncology Physician [facility]    .Marland Kitchen

## 2022-02-16 NOTE — Telephone Encounter (Signed)
Scheduled follow-up appointments per 2/17 los. Patient is aware. 

## 2022-02-18 ENCOUNTER — Other Ambulatory Visit: Payer: Self-pay | Admitting: Internal Medicine

## 2022-02-25 DIAGNOSIS — Z1231 Encounter for screening mammogram for malignant neoplasm of breast: Secondary | ICD-10-CM | POA: Diagnosis not present

## 2022-02-25 DIAGNOSIS — Z6826 Body mass index (BMI) 26.0-26.9, adult: Secondary | ICD-10-CM | POA: Diagnosis not present

## 2022-02-25 DIAGNOSIS — Z01419 Encounter for gynecological examination (general) (routine) without abnormal findings: Secondary | ICD-10-CM | POA: Diagnosis not present

## 2022-02-25 DIAGNOSIS — Z124 Encounter for screening for malignant neoplasm of cervix: Secondary | ICD-10-CM | POA: Diagnosis not present

## 2022-03-01 DIAGNOSIS — R109 Unspecified abdominal pain: Secondary | ICD-10-CM | POA: Diagnosis not present

## 2022-03-05 ENCOUNTER — Ambulatory Visit: Payer: BC Managed Care – PPO | Admitting: Rheumatology

## 2022-03-31 ENCOUNTER — Ambulatory Visit: Payer: BC Managed Care – PPO | Admitting: Rheumatology

## 2022-04-08 NOTE — Progress Notes (Signed)
error 

## 2022-04-20 ENCOUNTER — Encounter: Payer: BC Managed Care – PPO | Admitting: Rheumatology

## 2022-04-20 ENCOUNTER — Encounter: Payer: Self-pay | Admitting: Rheumatology

## 2022-04-20 VITALS — BP 133/79 | HR 58 | Ht 59.0 in | Wt 130.0 lb

## 2022-04-20 DIAGNOSIS — R7303 Prediabetes: Secondary | ICD-10-CM

## 2022-04-20 DIAGNOSIS — E038 Other specified hypothyroidism: Secondary | ICD-10-CM

## 2022-04-20 DIAGNOSIS — M7712 Lateral epicondylitis, left elbow: Secondary | ICD-10-CM

## 2022-04-20 DIAGNOSIS — Z96643 Presence of artificial hip joint, bilateral: Secondary | ICD-10-CM

## 2022-04-20 DIAGNOSIS — I10 Essential (primary) hypertension: Secondary | ICD-10-CM

## 2022-04-20 DIAGNOSIS — E538 Deficiency of other specified B group vitamins: Secondary | ICD-10-CM

## 2022-04-20 DIAGNOSIS — F5104 Psychophysiologic insomnia: Secondary | ICD-10-CM

## 2022-04-20 DIAGNOSIS — M654 Radial styloid tenosynovitis [de Quervain]: Secondary | ICD-10-CM

## 2022-04-20 DIAGNOSIS — M1712 Unilateral primary osteoarthritis, left knee: Secondary | ICD-10-CM

## 2022-04-20 DIAGNOSIS — Z8249 Family history of ischemic heart disease and other diseases of the circulatory system: Secondary | ICD-10-CM

## 2022-04-20 DIAGNOSIS — G5611 Other lesions of median nerve, right upper limb: Secondary | ICD-10-CM

## 2022-04-20 DIAGNOSIS — Z8639 Personal history of other endocrine, nutritional and metabolic disease: Secondary | ICD-10-CM

## 2022-04-20 DIAGNOSIS — K219 Gastro-esophageal reflux disease without esophagitis: Secondary | ICD-10-CM

## 2022-04-20 DIAGNOSIS — R768 Other specified abnormal immunological findings in serum: Secondary | ICD-10-CM
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
## 2022-04-26 ENCOUNTER — Ambulatory Visit: Payer: BC Managed Care – PPO | Admitting: Internal Medicine

## 2022-04-26 ENCOUNTER — Encounter: Payer: Self-pay | Admitting: Internal Medicine

## 2022-04-26 VITALS — BP 118/60 | HR 66 | Temp 97.9°F | Ht 59.0 in | Wt 135.0 lb

## 2022-04-26 DIAGNOSIS — R300 Dysuria: Secondary | ICD-10-CM

## 2022-04-26 DIAGNOSIS — I10 Essential (primary) hypertension: Secondary | ICD-10-CM | POA: Diagnosis not present

## 2022-04-26 DIAGNOSIS — N3000 Acute cystitis without hematuria: Secondary | ICD-10-CM | POA: Insufficient documentation

## 2022-04-26 DIAGNOSIS — N3001 Acute cystitis with hematuria: Secondary | ICD-10-CM

## 2022-04-26 LAB — POC URINALSYSI DIPSTICK (AUTOMATED)
Bilirubin, UA: NEGATIVE
Glucose, UA: NEGATIVE
Ketones, UA: NEGATIVE
Nitrite, UA: NEGATIVE
Protein, UA: NEGATIVE
Spec Grav, UA: 1.01 (ref 1.010–1.025)
Urobilinogen, UA: 0.2 E.U./dL
pH, UA: 6 (ref 5.0–8.0)

## 2022-04-26 MED ORDER — SULFAMETHOXAZOLE-TRIMETHOPRIM 800-160 MG PO TABS: 1.0000 | ORAL_TABLET | Freq: Two times a day (BID) | ORAL | 0 refills | Status: AC

## 2022-04-26 NOTE — Assessment & Plan Note (Signed)
Acute ?Urine dip consistent with UTI ?Will send urine for culture ?Take the antibiotic as prescribed.  Bactrim DS 1 tab twice daily for 5 days ?Can continue Azo ?Take tylenol if needed.   ?Increase your water intake.  ?Call if no improvement  ? ?

## 2022-04-26 NOTE — Progress Notes (Signed)
? ? ?Subjective:  ? ? Patient ID: Maria Barker, female    DOB: May 08, 1961, 61 y.o.   MRN: [id number] ? ?This visit occurred during the SARS-CoV-2 public health emergency.  Safety protocols were in place, including screening questions prior to the visit, additional usage of staff PPE, and extensive cleaning of exam room while observing appropriate contact time as indicated for disinfecting solutions. ? ? ? ?HPI ?[place] is here for  ?Chief Complaint  ?Patient presents with  ? Urinary Tract Infection  ? ? ? ?? UTI:  Her symptoms started  1 days ago.  She states dysuria, urinary frequency, urinary urgency, hematuria, mild bladder pressure and pain, back pain and chills.  She denies nausea, fever. ? ?She denies other symptoms.  ? ? ?She tried taking cranberry juice and azo.   ? ? ?Medications and allergies reviewed with patient and updated if appropriate. ? ?Current Outpatient Medications on File Prior to Visit  ?Medication Sig Dispense Refill  ? atorvastatin (LIPITOR) 10 MG tablet TAKE 1 TABLET BY MOUTH ONCE DAILY(MUST SEE DOCTOR FOR REFILLS ANNUAL APPT IN NOV) 90 tablet 1  ? Calcium Carbonate-Vitamin D (CALCIUM-VITAMIN D3 PO) Take 1 each by mouth 2 (two) times daily.    ? estradiol (ESTRACE) 0.1 MG/GM vaginal cream Place vaginally.    ? levothyroxine (EUTHYROX) 50 MCG tablet TAKE 1 TABLET BY MOUTH ONCE DAILY 6  DAYS  A  WEEK 90 tablet 2  ? melatonin 5 MG TABS Take 5 mg by mouth daily as needed.    ? olmesartan (BENICAR) 5 MG tablet TAKE 1 TABLET BY MOUTH ONCE DAILY . 90 tablet 1  ? Omega-3 1000 MG CAPS Take 2,000 mg by mouth.    ? omeprazole (PRILOSEC) 20 MG capsule TAKE 1 CAPSULE BY MOUTH ONCE DAILY 30 MINUTES PRIOR TO A MEAL 30 capsule 0  ? vitamin B-12 (CYANOCOBALAMIN) 1000 MCG tablet Take 1,000 mcg by mouth daily.    ? zaleplon (SONATA) 10 MG capsule Take 1 capsule (10 mg total) by mouth at bedtime as needed for sleep. 30 capsule 0  ? Cetirizine HCl 10 MG CAPS Take 1 capsule (10 mg total) by mouth daily for 10 days. 10  capsule 0  ? ?No current facility-administered medications on file prior to visit.  ? ? ?Review of Systems ? ?   ?Objective:  ? ?Vitals:  ? 04/26/22 1432  ?BP: 118/60  ?Pulse: 66  ?Temp: 97.9 ?F (36.6 ?C)  ?SpO2: 97%  ? ?BP Readings from Last 3 Encounters:  ?04/26/22 118/60  ?04/20/22 133/79  ?01/22/22 (!) 143/76  ? ?Wt Readings from Last 3 Encounters:  ?04/26/22 135 lb (61.2 kg)  ?04/20/22 130 lb (59 kg)  ?01/22/22 131 lb 14.4 oz (59.8 kg)  ? ?Body mass index is 27.27 kg/m?. ? ?  ?Physical Exam ?Constitutional:   ?   General: She is not in acute distress. ?   Appearance: Normal appearance. She is not ill-appearing.  ?HENT:  ?   Head: Normocephalic.  ?Eyes:  ?   Conjunctiva/sclera: Conjunctivae normal.  ?Abdominal:  ?   General: There is no distension.  ?   Palpations: Abdomen is soft.  ?   Tenderness: There is abdominal tenderness (minimal). There is no right CVA tenderness, left CVA tenderness, guarding or rebound.  ?Skin: ?   General: Skin is warm and dry.  ?Neurological:  ?   Mental Status: She is alert.  ? ?   ? ? ? ? ? ?Assessment & Plan:  ? ? ?  See Problem List for Assessment and Plan of chronic medical problems.  ? ? ? ? ?

## 2022-04-26 NOTE — Assessment & Plan Note (Signed)
Chronic ?Blood pressure well controlled ?Continue Benicar 5 mg daily ?

## 2022-04-26 NOTE — Patient Instructions (Signed)
Take the antibiotic as prescribed.  Take tylenol if needed.     Increase your water intake.   Call if no improvement     Urinary Tract Infection, Adult A urinary tract infection (UTI) is an infection of any part of the urinary tract, which includes the kidneys, ureters, bladder, and urethra. These organs make, store, and get rid of urine in the body. UTI can be a bladder infection (cystitis) or kidney infection (pyelonephritis). What are the causes? This infection may be caused by fungi, viruses, or bacteria. Bacteria are the most common cause of UTIs. This condition can also be caused by repeated incomplete emptying of the bladder during urination. What increases the risk? This condition is more likely to develop if:  You ignore your need to urinate or hold urine for long periods of time.  You do not empty your bladder completely during urination.  You wipe back to front after urinating or having a bowel movement, if you are female.  You are uncircumcised, if you are female.  You are constipated.  You have a urinary catheter that stays in place (indwelling).  You have a weak defense (immune) system.  You have a medical condition that affects your bowels, kidneys, or bladder.  You have diabetes.  You take antibiotic medicines frequently or for long periods of time, and the antibiotics no longer work well against certain types of infections (antibiotic resistance).  You take medicines that irritate your urinary tract.  You are exposed to chemicals that irritate your urinary tract.  You are female.  What are the signs or symptoms? Symptoms of this condition include:  Fever.  Frequent urination or passing small amounts of urine frequently.  Needing to urinate urgently.  Pain or burning with urination.  Urine that smells bad or unusual.  Cloudy urine.  Pain in the lower abdomen or back.  Trouble urinating.  Blood in the urine.  Vomiting or being less hungry than  normal.  Diarrhea or abdominal pain.  Vaginal discharge, if you are female.  How is this diagnosed? This condition is diagnosed with a medical history and physical exam. You will also need to provide a urine sample to test your urine. Other tests may be done, including:  Blood tests.  Sexually transmitted disease (STD) testing.  If you have had more than one UTI, a cystoscopy or imaging studies may be done to determine the cause of the infections. How is this treated? Treatment for this condition often includes a combination of two or more of the following:  Antibiotic medicine.  Other medicines to treat less common causes of UTI.  Over-the-counter medicines to treat pain.  Drinking enough water to stay hydrated.  Follow these instructions at home:  Take over-the-counter and prescription medicines only as told by your health care provider.  If you were prescribed an antibiotic, take it as told by your health care provider. Do not stop taking the antibiotic even if you start to feel better.  Avoid alcohol, caffeine, tea, and carbonated beverages. They can irritate your bladder.  Drink enough fluid to keep your urine clear or pale yellow.  Keep all follow-up visits as told by your health care provider. This is important.  Make sure to: ? Empty your bladder often and completely. Do not hold urine for long periods of time. ? Empty your bladder before and after sex. ? Wipe from front to back after a bowel movement if you are female. Use each tissue one time when you   wipe. Contact a health care provider if:  You have back pain.  You have a fever.  You feel nauseous or vomit.  Your symptoms do not get better after 3 days.  Your symptoms go away and then return. Get help right away if:  You have severe back pain or lower abdominal pain.  You are vomiting and cannot keep down any medicines or water. This information is not intended to replace advice given to you by  your health care provider. Make sure you discuss any questions you have with your health care provider. Document Released: 09/22/2005 Document Revised: 05/26/2016 Document Reviewed: 11/03/2015 Elsevier Interactive Patient Education  2018 [employer]   

## 2022-04-28 LAB — CULTURE, URINE COMPREHENSIVE

## 2022-05-07 NOTE — Progress Notes (Signed)
Office Visit Note  Patient: Maria Barker             Date of Birth: March 11, 1961           MRN: [id number]             PCP: Binnie Rail, MD Referring: Binnie Rail, MD Visit Date: 05/19/2022 Occupation: @GUAROCC @  Subjective:  Joint pain and abnormal labs  History of Present Illness: Maria Barker is a 61 y.o. female seen in consultation per request of her PCP.  She was accompanied by her daughter Synetta Shadow today.  According to the patient her symptoms a started about 2 years ago with bilateral elbow joint pain.  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  She was evaluated by Dr. Georgina Snell and did physical therapy for her elbows which did not help.  She had a cortisone injection in her left elbow which helped her for about couple of months.  She states the cortisone injection also relieved the pain in her right elbow.  The pain gradually came back.  She also works on a paddle for 8 hours a day which causes discomfort in her right knee joint.  She has been experiencing some discomfort in her left hip when she lays on the left side and that the pain radiates into her left knee joint.  She continues to have some lower back pain.  She denies any history of joint swelling.  She also has discomfort in her neck and upper back which she describes over the trapezius region.  She notices decreased grip strength in her hands.  She has off-and-on discomfort in her hip joints which are replaced.  There is no family history of rheumatoid arthritis.  Gravida 5, para 5, miscarriages 0.  There is no history of DVTs.  Activities of Daily Living:  Patient reports morning stiffness for 1 hour.   Patient Reports nocturnal pain.  Difficulty dressing/grooming: Denies Difficulty climbing stairs: Denies Difficulty getting out of chair: Denies Difficulty using hands for taps, buttons, cutlery, and/or writing: Denies  Review of Systems  Constitutional:  Positive for fatigue.  HENT:  Negative for mouth dryness.    Eyes:  Positive for dryness.  Respiratory:  Negative for shortness of breath.   Cardiovascular:  Negative for swelling in legs/feet.  Gastrointestinal:  Negative for constipation.  Endocrine: Positive for excessive thirst.  Genitourinary:  Negative for difficulty urinating.  Musculoskeletal:  Positive for joint pain, joint pain and morning stiffness.  Skin:  Negative for color change, rash and sensitivity to sunlight.  Allergic/Immunologic: Negative for susceptible to infections.  Neurological:  Positive for numbness.  Hematological:  Negative for bruising/bleeding tendency and swollen glands.  Psychiatric/Behavioral:  Positive for sleep disturbance. Negative for depressed mood. The patient is not nervous/anxious.    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   Pain of both elbows 05/11/2021   Left leg pain 05/11/2021   Osteopenia - Dr Corinna Capra 11/27/2020   Left hip pain 05/08/2020   Epigastric pain 03/28/2019   Rash on lips 01/08/2019   Essential hypertension 01/08/2019   De Quervain's tenosynovitis, right 03/09/2018   Prediabetes 09/17/2017   Osteoarthritis of left knee 04/12/2017   B12 deficiency 08/13/2016   S/P bilateral hip replacements 08/13/2016   Family history  of brain aneurysm 08/12/2015   Gastroesophageal reflux disease without esophagitis 08/12/2015   Hypothyroid 02/06/2015   Hyperlipidemia    Chronic insomnia 04/13/2013    Past Medical History:  Diagnosis Date   Arthritis    R THR 2011   Back pain    Frequent headaches    Hyperlipidemia LDL goal < 130    Hypertension    Hypothyroid 02/06/2015   Dx 01/2015: TSH 5.4   Insomnia    Left hip pain    Pain of left thigh     Family History  Problem Relation Age of Onset   CVA Mother    Hypertension Mother     CVA Sister    Past Surgical History:  Procedure Laterality Date   TOTAL HIP ARTHROPLASTY Right 03/31/10   olin   TOTAL HIP ARTHROPLASTY Left 03/08/2016   Procedure: TOTAL HIP ARTHROPLASTY;  Surgeon: Frederik Pear, MD;  Location: [facility];  Service: Orthopedics;  Laterality: Left;   TUBAL LIGATION     UPPER GASTROINTESTINAL ENDOSCOPY     Social History   Social History Narrative   Not on file   Immunization History  Administered Date(s) Administered   Influenza Split 08/27/2013   Influenza,inj,Quad PF,6+ Mos 02/04/2015, 09/16/2017, 09/22/2018, 09/21/2019, 11/10/2020   PFIZER(Purple Top)SARS-COV-2 Vaccination 03/24/2020, 04/14/2020   Tdap 02/04/2015     Objective: Vital Signs: BP 129/78 (BP Location: Right Arm, Patient Position: Sitting, Cuff Size: Normal)   Pulse 61   Resp 15   Ht 4' 10.5" (1.486 m)   Wt 133 lb (60.3 kg)   BMI 27.32 kg/m    Physical Exam Vitals and nursing note reviewed.  Constitutional:      Appearance: She is well-developed.  HENT:     Head: Normocephalic and atraumatic.  Eyes:     Conjunctiva/sclera: Conjunctivae normal.  Cardiovascular:     Rate and Rhythm: Normal rate and regular rhythm.     Heart sounds: Normal heart sounds.  Pulmonary:     Effort: Pulmonary effort is normal.     Breath sounds: Normal breath sounds.  Abdominal:     General: Bowel sounds are normal.     Palpations: Abdomen is soft.  Musculoskeletal:     Cervical back: Normal range of motion.  Lymphadenopathy:     Cervical: No cervical adenopathy.  Skin:    General: Skin is warm and dry.     Capillary Refill: Capillary refill takes less than 2 seconds.  Neurological:     Mental Status: She is alert and oriented to person, place, and time.  Psychiatric:        Behavior: Behavior normal.     Musculoskeletal Exam: C-spine was in good range of motion.  She had a spasm in the left trapezius region.  Thoracic spine was in good range of motion.  She had discomfort range of  motion of her lumbar spine.  Shoulder joints and elbow joints with good range of motion.  She had discomfort over bilateral lateral epicondyle region.  There was no tenderness over wrist joints, MCPs or PIPs.  Bilateral DIP thickening with no synovitis was noted.  Hip joints were in good range of motion with some discomfort.  She had tenderness over left trochanter.  Knee joints with good range of motion.  She had no tenderness over ankles or MTPs.  CDAI Exam: CDAI Score: -- Patient Global: --; Provider Global: -- Swollen: --; Tender: -- Joint Exam 05/19/2022   No joint exam has been documented for this  visit   There is currently no information documented on the homunculus. Go to the Rheumatology activity and complete the homunculus joint exam.  Investigation: No additional findings.  Imaging: No results found.  Recent Labs: Lab Results  Component Value Date   WBC 6.3 01/15/2022   HGB 12.2 01/15/2022   PLT 349 01/15/2022   NA 132 (L) 05/14/2022   K 3.6 05/14/2022   CL 100 05/14/2022   CO2 24 05/14/2022   GLUCOSE 95 05/14/2022   BUN 23 05/14/2022   CREATININE 0.67 05/14/2022   BILITOT 0.4 05/14/2022   ALKPHOS 58 05/14/2022   AST 26 05/14/2022   ALT 27 05/14/2022   PROT 8.6 (H) 05/14/2022   ALBUMIN 4.2 05/14/2022   CALCIUM 9.4 05/14/2022   GFRAA >60 03/09/2016    Speciality Comments: No specialty comments available.  Procedures:  No procedures performed Allergies: Asa [aspirin] and Ibuprofen   Assessment / Plan:     Visit Diagnoses: Pain in both hands -she complains of pain and discomfort in her bilateral hands.  She denies any history of joint swelling.  The pain is mostly in her DIP joints.  Bilateral DIP narrowing was noted.  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  Plan: XR Hand 2 View Right, XR Hand 2 View Left x-rays of bilateral hands were consistent with osteoarthritis.  A cystic versus erosive change was noted in the right ulnar styloid.  Which raises  concern of possible inflammatory arthritis.  I will discuss ultrasound of the right hand at the follow-up visit.  Patient declined physical therapy.  Cyclic citrullinated peptide (CCP) antibody positive - 12/08/21: RF<10, Anti-CCP 22, ESR 35.  Sed rate is mildly elevated and anti-CCP antibody was borderline positive.  She had no synovitis on the examination.  Lateral epicondylitis of left elbow-she can history of bilateral lateral epicondylitis.  She had mild tenderness over bilateral lateral epicondyle region.  She had a cortisone injection by Dr. Georgina Snell in the past which was helpful.  She has tried physical therapy without much relief.  I offered physical therapy which she declined.  I gave her a prescription for tennis elbow brace.  A prescription for Voltaren gel was sent to be used topically.  Side effects of Voltaren gel were reviewed.  Pronator syndrome of right upper extremity  S/P bilateral hip replacements-she had bilateral total hip replacement in the past.  She had fairly good range of motion with minimal discomfort.  Trochanteric bursitis, left hip-she complains of nocturnal pain in her left hip.  She had a good response to trochanteric bursa injection in the past.  She had tenderness over left trochanteric bursa.  I offered cortisone injection which she declined.  I gave her a handout on IT band stretches.  Primary osteoarthritis of left knee-she denies any discomfort in her left knee today.  Trapezius muscle spasm-she complains of discomfort in the left trapezius region.  She had tenderness over left trapezius.  Chronic midline low back pain without sciatica -she has been experiencing pain and discomfort in her lower back.  She denies any radiculopathy.  Plan: XR Lumbar Spine 2-3 Views.  X-ray of the lumbar spine showed mild degenerative changes and facet joint arthropathy.  A handout on back exercises was given.  Patient declined physical therapy.  Essential hypertension-blood  pressure was normal today.  History of hyperlipidemia  Prediabetes  Other specified hypothyroidism  Gastroesophageal reflux disease without esophagitis  Chronic insomnia  B12 deficiency  Family history of brain aneurysm  Orders: Orders Placed This Encounter  Procedures   XR Hand 2 View Right   XR Hand 2 View Left   XR Lumbar Spine 2-3 Views   Meds ordered this encounter  Medications   diclofenac Sodium (VOLTAREN) 1 % GEL    Sig: Apply 2-4 grams to affected joint up to 4 times daily as needed    Dispense:  100 g    Refill:  2     Follow-Up Instructions: Return for Osteoarthritis.   Bo Merino, MD  Note - This record has been created using Editor, commissioning.  Chart creation errors have been sought, but may not always  have been located. Such creation errors do not reflect on  the standard of medical care.

## 2022-05-13 ENCOUNTER — Encounter: Payer: Self-pay | Admitting: Internal Medicine

## 2022-05-13 NOTE — Progress Notes (Signed)
Subjective:    Patient ID: Maria Barker, female    DOB: May 13, 1961, 61 y.o.   MRN: [id number]     HPI Maria Barker is here for follow up of her chronic medical problems, including htn, hld, prediabetes, hypothyroid, insomnia, gerd  She had a couple episodes of feeling like transient dizziness but not a spinning sensation - more like someone was pushing her.  It was very very transient - 1 sec.  No other associated symptoms.     Medications and allergies reviewed with patient and updated if appropriate.  Current Outpatient Medications on File Prior to Visit  Medication Sig Dispense Refill   atorvastatin (LIPITOR) 10 MG tablet TAKE 1 TABLET BY MOUTH ONCE DAILY(MUST SEE DOCTOR FOR REFILLS ANNUAL APPT IN NOV) 90 tablet 1   Calcium Carbonate-Vitamin D (CALCIUM-VITAMIN D3 PO) Take 1 each by mouth 2 (two) times daily.     estradiol (ESTRACE) 0.1 MG/GM vaginal cream Place vaginally.     levothyroxine (EUTHYROX) 50 MCG tablet TAKE 1 TABLET BY MOUTH ONCE DAILY 6  DAYS  A  WEEK 90 tablet 2   melatonin 5 MG TABS Take 5 mg by mouth daily as needed.     olmesartan (BENICAR) 5 MG tablet TAKE 1 TABLET BY MOUTH ONCE DAILY . 90 tablet 1   Omega-3 1000 MG CAPS Take 2,000 mg by mouth.     vitamin B-12 (CYANOCOBALAMIN) 1000 MCG tablet Take 1,000 mcg by mouth daily.     No current facility-administered medications on file prior to visit.     Review of Systems  Constitutional:  Negative for chills and fever.  Respiratory:  Negative for cough, shortness of breath and wheezing.   Cardiovascular:  Negative for chest pain, palpitations and leg swelling.  Musculoskeletal:  Positive for arthralgias.  Neurological:  Negative for light-headedness and headaches.      Objective:   Vitals:   05/14/22 0825  BP: 122/68  Pulse: 70  Temp: 97.9 F (36.6 C)  SpO2: 99%   BP Readings from Last 3 Encounters:  05/14/22 122/68  04/26/22 118/60  04/20/22 133/79   Wt Readings from Last 3 Encounters:  05/14/22  135 lb (61.2 kg)  04/26/22 135 lb (61.2 kg)  04/20/22 130 lb (59 kg)   Body mass index is 27.27 kg/m.    Physical Exam Constitutional:      General: She is not in acute distress.    Appearance: Normal appearance.  HENT:     Head: Normocephalic and atraumatic.  Eyes:     Conjunctiva/sclera: Conjunctivae normal.  Cardiovascular:     Rate and Rhythm: Normal rate and regular rhythm.     Heart sounds: Normal heart sounds. No murmur heard. Pulmonary:     Effort: Pulmonary effort is normal. No respiratory distress.     Breath sounds: Normal breath sounds. No wheezing.  Musculoskeletal:     Cervical back: Neck supple.     Right lower leg: No edema.     Left lower leg: No edema.  Lymphadenopathy:     Cervical: No cervical adenopathy.  Skin:    General: Skin is warm and dry.     Findings: No rash.  Neurological:     Mental Status: She is alert. Mental status is at baseline.  Psychiatric:        Mood and Affect: Mood normal.        Behavior: Behavior normal.       Lab Results  Component Value Date  WBC 6.3 01/15/2022   HGB 12.2 01/15/2022   HCT 37.1 01/15/2022   PLT 349 01/15/2022   GLUCOSE 121 (H) 01/15/2022   CHOL 185 11/13/2021   TRIG 129.0 11/13/2021   HDL 58.10 11/13/2021   LDLDIRECT 93.7 12/21/2013   LDLCALC 101 (H) 11/13/2021   ALT 28 01/15/2022   AST 25 01/15/2022   NA 135 01/15/2022   K 3.6 01/15/2022   CL 104 01/15/2022   CREATININE 0.69 01/15/2022   BUN 24 (H) 01/15/2022   CO2 22 01/15/2022   TSH 0.734 12/08/2021   INR 1.18 02/27/2016   HGBA1C 6.0 11/13/2021     Assessment & Plan:    See Problem List for Assessment and Plan of chronic medical problems.

## 2022-05-14 ENCOUNTER — Ambulatory Visit: Payer: BC Managed Care – PPO | Admitting: Internal Medicine

## 2022-05-14 ENCOUNTER — Encounter: Payer: Self-pay | Admitting: Internal Medicine

## 2022-05-14 VITALS — BP 122/68 | HR 70 | Temp 97.9°F | Ht 59.0 in | Wt 135.0 lb

## 2022-05-14 DIAGNOSIS — R7303 Prediabetes: Secondary | ICD-10-CM | POA: Diagnosis not present

## 2022-05-14 DIAGNOSIS — I10 Essential (primary) hypertension: Secondary | ICD-10-CM | POA: Diagnosis not present

## 2022-05-14 DIAGNOSIS — E782 Mixed hyperlipidemia: Secondary | ICD-10-CM | POA: Diagnosis not present

## 2022-05-14 DIAGNOSIS — K219 Gastro-esophageal reflux disease without esophagitis: Secondary | ICD-10-CM

## 2022-05-14 DIAGNOSIS — E038 Other specified hypothyroidism: Secondary | ICD-10-CM

## 2022-05-14 DIAGNOSIS — F5104 Psychophysiologic insomnia: Secondary | ICD-10-CM

## 2022-05-14 LAB — LIPID PANEL
Cholesterol: 178 mg/dL (ref 0–200)
HDL: 68.2 mg/dL (ref >39.00)
LDL Cholesterol: 93 mg/dL (ref 0–99)
NonHDL: 109.59
Total CHOL/HDL Ratio: 3
Triglycerides: 81 mg/dL (ref 0.0–149.0)
VLDL: 16.2 mg/dL (ref 0.0–40.0)

## 2022-05-14 LAB — COMPREHENSIVE METABOLIC PANEL
ALT: 27 U/L (ref 0–35)
AST: 26 U/L (ref 0–37)
Albumin: 4.2 g/dL (ref 3.5–5.2)
Alkaline Phosphatase: 58 U/L (ref 39–117)
BUN: 23 mg/dL (ref 6–23)
CO2: 24 mEq/L (ref 19–32)
Calcium: 9.4 mg/dL (ref 8.4–10.5)
Chloride: 100 mEq/L (ref 96–112)
Creatinine, Ser: 0.67 mg/dL (ref 0.40–1.20)
GFR: 96 mL/min (ref >60.00)
Glucose, Bld: 95 mg/dL (ref 70–99)
Potassium: 3.6 mEq/L (ref 3.5–5.1)
Sodium: 132 mEq/L — ABNORMAL LOW (ref 135–145)
Total Bilirubin: 0.4 mg/dL (ref 0.2–1.2)
Total Protein: 8.6 g/dL — ABNORMAL HIGH (ref 6.0–8.3)

## 2022-05-14 LAB — TSH: TSH: 0.8 u[IU]/mL (ref 0.35–5.50)

## 2022-05-14 LAB — HEMOGLOBIN A1C: Hgb A1c MFr Bld: 6.1 % (ref 4.6–6.5)

## 2022-05-14 MED ORDER — ATORVASTATIN CALCIUM 10 MG PO TABS: ORAL_TABLET | ORAL | 1 refills | Status: DC

## 2022-05-14 NOTE — Assessment & Plan Note (Signed)
Chronic Check a1c Low sugar / carb diet Stressed regular exercise  

## 2022-05-14 NOTE — Assessment & Plan Note (Signed)
Chronic BP well controlled Continue olmesartan 5 mg daily,  cmp

## 2022-05-14 NOTE — Assessment & Plan Note (Signed)
Chronic  Clinically euthyroid Currently taking levothyroxine 50 mcg 6 days a week Check tsh  Titrate med dose if needed

## 2022-05-14 NOTE — Patient Instructions (Addendum)
     Blood work was ordered.      Medications changes include :   None      Return in about 6 months (around 11/14/2022) for Physical Exam.

## 2022-05-14 NOTE — Assessment & Plan Note (Addendum)
Chronic Taking melatonin nightly which is helping more than prescription medication. Continue melatonin

## 2022-05-14 NOTE — Assessment & Plan Note (Signed)
Chronic Regular exercise and healthy diet encouraged Check lipid panel  Continue atorvastatin 10 mg daily 

## 2022-05-14 NOTE — Assessment & Plan Note (Signed)
Chronic GERD controlled Continue omeprazole 20 mg daily  

## 2022-05-19 ENCOUNTER — Ambulatory Visit: Payer: BC Managed Care – PPO | Admitting: Rheumatology

## 2022-05-19 ENCOUNTER — Ambulatory Visit (INDEPENDENT_AMBULATORY_CARE_PROVIDER_SITE_OTHER): Payer: BC Managed Care – PPO

## 2022-05-19 ENCOUNTER — Encounter: Payer: Self-pay | Admitting: Rheumatology

## 2022-05-19 VITALS — BP 129/78 | HR 61 | Resp 15 | Ht 58.5 in | Wt 133.0 lb

## 2022-05-19 DIAGNOSIS — M545 Low back pain, unspecified: Secondary | ICD-10-CM

## 2022-05-19 DIAGNOSIS — G5611 Other lesions of median nerve, right upper limb: Secondary | ICD-10-CM

## 2022-05-19 DIAGNOSIS — F5104 Psychophysiologic insomnia: Secondary | ICD-10-CM

## 2022-05-19 DIAGNOSIS — M5459 Other low back pain: Secondary | ICD-10-CM | POA: Diagnosis not present

## 2022-05-19 DIAGNOSIS — R768 Other specified abnormal immunological findings in serum: Secondary | ICD-10-CM | POA: Diagnosis not present

## 2022-05-19 DIAGNOSIS — M79642 Pain in left hand: Secondary | ICD-10-CM

## 2022-05-19 DIAGNOSIS — R7303 Prediabetes: Secondary | ICD-10-CM

## 2022-05-19 DIAGNOSIS — Z8249 Family history of ischemic heart disease and other diseases of the circulatory system: Secondary | ICD-10-CM

## 2022-05-19 DIAGNOSIS — M62838 Other muscle spasm: Secondary | ICD-10-CM

## 2022-05-19 DIAGNOSIS — G8929 Other chronic pain: Secondary | ICD-10-CM

## 2022-05-19 DIAGNOSIS — M654 Radial styloid tenosynovitis [de Quervain]: Secondary | ICD-10-CM

## 2022-05-19 DIAGNOSIS — M79641 Pain in right hand: Secondary | ICD-10-CM | POA: Diagnosis not present

## 2022-05-19 DIAGNOSIS — M7712 Lateral epicondylitis, left elbow: Secondary | ICD-10-CM

## 2022-05-19 DIAGNOSIS — M1712 Unilateral primary osteoarthritis, left knee: Secondary | ICD-10-CM

## 2022-05-19 DIAGNOSIS — M7062 Trochanteric bursitis, left hip: Secondary | ICD-10-CM

## 2022-05-19 DIAGNOSIS — Z8639 Personal history of other endocrine, nutritional and metabolic disease: Secondary | ICD-10-CM

## 2022-05-19 DIAGNOSIS — Z96643 Presence of artificial hip joint, bilateral: Secondary | ICD-10-CM

## 2022-05-19 DIAGNOSIS — E538 Deficiency of other specified B group vitamins: Secondary | ICD-10-CM

## 2022-05-19 DIAGNOSIS — I10 Essential (primary) hypertension: Secondary | ICD-10-CM

## 2022-05-19 DIAGNOSIS — E038 Other specified hypothyroidism: Secondary | ICD-10-CM

## 2022-05-19 DIAGNOSIS — K219 Gastro-esophageal reflux disease without esophagitis: Secondary | ICD-10-CM

## 2022-05-19 MED ORDER — DICLOFENAC SODIUM 1 % EX GEL: CUTANEOUS | 2 refills | Status: DC

## 2022-05-19 NOTE — Patient Instructions (Signed)
Back Exercises The following exercises strengthen the muscles that help to support the trunk (torso) and back. They also help to keep the lower back flexible. Doing these exercises can help to prevent or lessen existing low back pain. If you have back pain or discomfort, try doing these exercises 2-3 times each day or as told by your health care provider. As your pain improves, do them once each day, but increase the number of times that you repeat the steps for each exercise (do more repetitions). To prevent the recurrence of back pain, continue to do these exercises once each day or as told by your health care provider. Do exercises exactly as told by your health care provider and adjust them as directed. It is normal to feel mild stretching, pulling, tightness, or discomfort as you do these exercises, but you should stop right away if you feel sudden pain or your pain gets worse. Exercises Single knee to chest Repeat these steps 3-5 times for each leg: Lie on your back on a firm bed or the floor with your legs extended. Bring one knee to your chest. Your other leg should stay extended and in contact with the floor. Hold your knee in place by grabbing your knee or thigh with both hands and hold. Pull on your knee until you feel a gentle stretch in your lower back or buttocks. Hold the stretch for 10-30 seconds. Slowly release and straighten your leg.  Pelvic tilt Repeat these steps 5-10 times: Lie on your back on a firm bed or the floor with your legs extended. Bend your knees so they are pointing toward the ceiling and your feet are flat on the floor. Tighten your lower abdominal muscles to press your lower back against the floor. This motion will tilt your pelvis so your tailbone points up toward the ceiling instead of pointing to your feet or the floor. With gentle tension and even breathing, hold this position for 5-10 seconds.  Cat-cow Repeat these steps until your lower back becomes  more flexible: Get into a hands-and-knees position on a firm bed or the floor. Keep your hands under your shoulders, and keep your knees under your hips. You may place padding under your knees for comfort. Let your head hang down toward your chest. Contract your abdominal muscles and point your tailbone toward the floor so your lower back becomes rounded like the back of a cat. Hold this position for 5 seconds. Slowly lift your head, let your abdominal muscles relax, and point your tailbone up toward the ceiling so your back forms a sagging arch like the back of a cow. Hold this position for 5 seconds.  Press-ups Repeat these steps 5-10 times: Lie on your abdomen (face-down) on a firm bed or the floor. Place your palms near your head, about shoulder-width apart. Keeping your back as relaxed as possible and keeping your hips on the floor, slowly straighten your arms to raise the top half of your body and lift your shoulders. Do not use your back muscles to raise your upper torso. You may adjust the placement of your hands to make yourself more comfortable. Hold this position for 5 seconds while you keep your back relaxed. Slowly return to lying flat on the floor.  Bridges Repeat these steps 10 times: Lie on your back on a firm bed or the floor. Bend your knees so they are pointing toward the ceiling and your feet are flat on the floor. Your arms should be flat   at your sides, next to your body. Tighten your buttocks muscles and lift your buttocks off the floor until your waist is at almost the same height as your knees. You should feel the muscles working in your buttocks and the back of your thighs. If you do not feel these muscles, slide your feet 1-2 inches (2.5-5 cm) farther away from your buttocks. Hold this position for 3-5 seconds. Slowly lower your hips to the starting position, and allow your buttocks muscles to relax completely. If this exercise is too easy, try doing it with your arms  crossed over your chest. Abdominal crunches Repeat these steps 5-10 times: Lie on your back on a firm bed or the floor with your legs extended. Bend your knees so they are pointing toward the ceiling and your feet are flat on the floor. Cross your arms over your chest. Tip your chin slightly toward your chest without bending your neck. Tighten your abdominal muscles and slowly raise your torso high enough to lift your shoulder blades a tiny bit off the floor. Avoid raising your torso higher than that because it can put too much stress on your lower back and does not help to strengthen your abdominal muscles. Slowly return to your starting position.  Back lifts Repeat these steps 5-10 times: Lie on your abdomen (face-down) with your arms at your sides, and rest your forehead on the floor. Tighten the muscles in your legs and your buttocks. Slowly lift your chest off the floor while you keep your hips pressed to the floor. Keep the back of your head in line with the curve in your back. Your eyes should be looking at the floor. Hold this position for 3-5 seconds. Slowly return to your starting position.  Contact a health care provider if: Your back pain or discomfort gets much worse when you do an exercise. Your worsening back pain or discomfort does not lessen within 2 hours after you exercise. If you have any of these problems, stop doing these exercises right away. Do not do them again unless your health care provider says that you can. Get help right away if: You develop sudden, severe back pain. If this happens, stop doing the exercises right away. Do not do them again unless your health care provider says that you can. This information is not intended to replace advice given to you by your health care provider. Make sure you discuss any questions you have with your health care provider. Document Revised: 06/09/2021 Document Reviewed: 02/25/2021 [employer] Patient Education  2023 [employer]   Iliotibial Band Syndrome Rehab Ask your health care provider which exercises are safe for you. Do exercises exactly as told by your health care provider and adjust them as directed. It is normal to feel mild stretching, pulling, tightness, or discomfort as you do these exercises. Stop right away if you feel sudden pain or your pain gets significantly worse. Do not begin these exercises until told by your health care provider. Stretching and range-of-motion exercises These exercises warm up your muscles and joints and improve the movement and flexibility of your hip and pelvis. Quadriceps stretch, prone  Lie on your abdomen (prone position) on a firm surface, such as a bed or padded floor. Bend your left / right knee and reach back to hold your ankle or pant leg. If you cannot reach your ankle or pant leg, loop a belt around your foot and grab the belt instead. Gently pull your heel toward your buttocks.   should not slide out to the side. You should feel a stretch in the front of your thigh and knee (quadriceps). Hold this position for __________ seconds. Repeat __________ times. Complete this exercise __________ times a day. Iliotibial band stretch An iliotibial band is a strong band of muscle tissue that runs from the outer side of your hip to the outer side of your thigh and knee. Lie on your side with your left / right leg in the top position. Bend both of your knees and grab your left / right ankle. Stretch out your bottom arm to help you balance. Slowly bring your top knee back so your thigh goes behind your trunk. Slowly lower your top leg toward the floor until you feel a gentle stretch on the outside of your left / right hip and thigh. If you do not feel a stretch and your knee will not fall farther, place the heel of your other foot on top of your knee and pull your knee down toward the floor with your foot. Hold this position for __________ seconds. Repeat __________ times.  Complete this exercise __________ times a day. Strengthening exercises These exercises build strength and endurance in your hip and pelvis. Endurance is the ability to use your muscles for a long time, even after they get tired. Straight leg raises, side-lying This exercise strengthens the muscles that rotate the leg at the hip and move it away from your body (hip abductors). Lie on your side with your left / right leg in the top position. Lie so your head, shoulder, hip, and knee line up. You may bend your bottom knee to help you balance. Roll your hips slightly forward so your hips are stacked directly over each other and your left / right knee is facing forward. Tense the muscles in your outer thigh and lift your top leg 4-6 inches (10-15 cm). Hold this position for __________ seconds. Slowly lower your leg to return to the starting position. Let your muscles relax completely before doing another repetition. Repeat __________ times. Complete this exercise __________ times a day. Leg raises, prone This exercise strengthens the muscles that move the hips backward (hip extensors). Lie on your abdomen (prone position) on your bed or a firm surface. You can put a pillow under your hips if that is more comfortable for your lower back. Bend your left / right knee so your foot is straight up in the air. Squeeze your buttocks muscles and lift your left / right thigh off the bed. Do not let your back arch. Tense your thigh muscle as hard as you can without increasing any knee pain. Hold this position for __________ seconds. Slowly lower your leg to return to the starting position and allow it to relax completely. Repeat __________ times. Complete this exercise __________ times a day. Hip hike Stand sideways on a bottom step. Stand on your left / right leg with your other foot unsupported next to the step. You can hold on to a railing or wall for balance if needed. Keep your knees straight and your  torso square. Then lift your left / right hip up toward the ceiling. Slowly let your left / right hip lower toward the floor, past the starting position. Your foot should get closer to the floor. Do not lean or bend your knees. Repeat __________ times. Complete this exercise __________ times a day. This information is not intended to replace advice given to you by your health care provider. Make sure you  discuss any questions you have with your health care provider. Document Revised: 02/20/2020 Document Reviewed: 02/20/2020 [employer] Patient Education  [place] Exercises Hand exercises can be helpful for almost anyone. These exercises can strengthen the hands, improve flexibility and movement, and increase blood flow to the hands. These results can make work and daily tasks easier. Hand exercises can be especially helpful for people who have joint pain from arthritis or have nerve damage from overuse (carpal tunnel syndrome). These exercises can also help people who have injured a hand. Exercises Most of these hand exercises are gentle stretching and motion exercises. It is usually safe to do them often throughout the day. Warming up your hands before exercise may help to reduce stiffness. You can do this with gentle massage or by placing your hands in warm water for 10-15 minutes. It is normal to feel some stretching, pulling, tightness, or mild discomfort as you begin new exercises. This will gradually improve. Stop an exercise right away if you feel sudden, severe pain or your pain gets worse. Ask your health care provider which exercises are best for you. Knuckle bend or "claw" fist  Stand or sit with your arm, hand, and all five fingers pointed straight up. Make sure to keep your wrist straight during the exercise. Gently bend your fingers down toward your palm until the tips of your fingers are touching the top of your palm. Keep your big knuckle straight and just bend the small  knuckles in your fingers. Hold this position for __________ seconds. Straighten (extend) your fingers back to the starting position. Repeat this exercise 5-10 times with each hand. Full finger fist  Stand or sit with your arm, hand, and all five fingers pointed straight up. Make sure to keep your wrist straight during the exercise. Gently bend your fingers into your palm until the tips of your fingers are touching the middle of your palm. Hold this position for __________ seconds. Extend your fingers back to the starting position, stretching every joint fully. Repeat this exercise 5-10 times with each hand. Straight fist Stand or sit with your arm, hand, and all five fingers pointed straight up. Make sure to keep your wrist straight during the exercise. Gently bend your fingers at the big knuckle, where your fingers meet your hand, and the middle knuckle. Keep the knuckle at the tips of your fingers straight and try to touch the bottom of your palm. Hold this position for __________ seconds. Extend your fingers back to the starting position, stretching every joint fully. Repeat this exercise 5-10 times with each hand. Tabletop  Stand or sit with your arm, hand, and all five fingers pointed straight up. Make sure to keep your wrist straight during the exercise. Gently bend your fingers at the big knuckle, where your fingers meet your hand, as far down as you can while keeping the small knuckles in your fingers straight. Think of forming a tabletop with your fingers. Hold this position for __________ seconds. Extend your fingers back to the starting position, stretching every joint fully. Repeat this exercise 5-10 times with each hand. Finger spread  Place your hand flat on a table with your palm facing down. Make sure your wrist stays straight as you do this exercise. Spread your fingers and thumb apart from each other as far as you can until you feel a gentle stretch. Hold this position  for __________ seconds. Bring your fingers and thumb tight together again. Hold this position for __________  seconds. Repeat this exercise 5-10 times with each hand. Making circles  Stand or sit with your arm, hand, and all five fingers pointed straight up. Make sure to keep your wrist straight during the exercise. Make a circle by touching the tip of your thumb to the tip of your index finger. Hold for __________ seconds. Then open your hand wide. Repeat this motion with your thumb and each finger on your hand. Repeat this exercise 5-10 times with each hand. Thumb motion  Sit with your forearm resting on a table and your wrist straight. Your thumb should be facing up toward the ceiling. Keep your fingers relaxed as you move your thumb. Lift your thumb up as high as you can toward the ceiling. Hold for __________ seconds. Bend your thumb across your palm as far as you can, reaching the tip of your thumb for the small finger (pinkie) side of your palm. Hold for __________ seconds. Repeat this exercise 5-10 times with each hand. Grip strengthening  Hold a stress ball or other soft ball in the middle of your hand. Slowly increase the pressure, squeezing the ball as much as you can without causing pain. Think of bringing the tips of your fingers into the middle of your palm. All of your finger joints should bend when doing this exercise. Hold your squeeze for __________ seconds, then relax. Repeat this exercise 5-10 times with each hand. Contact a health care provider if: Your hand pain or discomfort gets much worse when you do an exercise. Your hand pain or discomfort does not improve within 2 hours after you exercise. If you have any of these problems, stop doing these exercises right away. Do not do them again unless your health care provider says that you can. Get help right away if: You develop sudden, severe hand pain or swelling. If this happens, stop doing these exercises right away.  Do not do them again unless your health care provider says that you can. This information is not intended to replace advice given to you by your health care provider. Make sure you discuss any questions you have with your health care provider. Document Revised: 04/02/2021 Document Reviewed: 04/02/2021 [employer] Patient Education  [place].

## 2022-05-30 NOTE — Progress Notes (Signed)
Office Visit Note  Patient: Maria Barker             Date of Birth: December 26, 1961           MRN: [id number]             PCP: Binnie Rail, MD Referring: Binnie Rail, MD Visit Date: 06/09/2022 Occupation: _0 @  Subjective:  Follow-up (Left elbow pain)   History of Present Illness: Maria Barker is a 61 y.o. female with history of osteoarthritis.  She states she continues to have pain and discomfort in her bilateral hands, left elbow, left trochanter area.  She states when she was on vacation her symptoms almost resolved.  When she returned back to work her elbow started hurting.  She has been using a left tennis elbow brace.  She is having difficulty doing the stretching exercises due to hip pain.  She symptoms have pain radiating pain from her left trochanteric area into her left thigh.  She continues to have stiffness in her hands.  She has difficulty making a fist.  She denies any history of joint swelling.  Activities of Daily Living:  Patient reports morning stiffness for 0  none .   Patient Reports nocturnal pain.  Difficulty dressing/grooming: Denies Difficulty climbing stairs: Denies Difficulty getting out of chair: Denies Difficulty using hands for taps, buttons, cutlery, and/or writing: Denies  Review of Systems  Constitutional:  Negative for fatigue.  HENT:  Negative for mouth dryness.   Eyes:  Positive for dryness.  Respiratory:  Negative for shortness of breath.   Cardiovascular:  Negative for swelling in legs/feet.  Gastrointestinal:  Negative for constipation.  Endocrine: Negative for excessive thirst.  Genitourinary:  Negative for difficulty urinating.  Musculoskeletal:  Positive for joint pain and joint pain.  Skin:  Negative for rash.  Allergic/Immunologic: Negative for susceptible to infections.  Neurological:  Negative for numbness.  Hematological:  Negative for bruising/bleeding tendency.  Psychiatric/Behavioral:  Positive for sleep disturbance.      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   Pain of both elbows 05/11/2021   Left leg pain 05/11/2021   Osteopenia - Dr Corinna Capra 11/27/2020   Left hip pain 05/08/2020   Epigastric pain 03/28/2019   Rash on lips 01/08/2019   Essential hypertension 01/08/2019   De Quervain's tenosynovitis, right 03/09/2018   Prediabetes 09/17/2017   Osteoarthritis of left knee 04/12/2017   B12 deficiency 08/13/2016   S/P bilateral hip replacements 08/13/2016   Family history of brain aneurysm 08/12/2015   Gastroesophageal reflux disease without esophagitis 08/12/2015   Hypothyroid 02/06/2015   Hyperlipidemia    Chronic insomnia 04/13/2013    Past Medical History:  Diagnosis Date   Arthritis    R THR 2011   Back pain    Frequent headaches    Hyperlipidemia LDL goal < 130    Hypertension    Hypothyroid 02/06/2015   Dx 01/2015: TSH 5.4   Insomnia    Left hip pain    Pain of left thigh     Family History  Problem Relation Age of Onset   CVA Mother    Hypertension Mother    CVA Sister    Past Surgical History:  Procedure Laterality Date   TOTAL HIP ARTHROPLASTY Right 03/31/10  olin   TOTAL HIP ARTHROPLASTY Left 03/08/2016   Procedure: TOTAL HIP ARTHROPLASTY;  Surgeon: Frederik Pear, MD;  Location: [place];  Service: Orthopedics;  Laterality: Left;   TUBAL LIGATION     UPPER GASTROINTESTINAL ENDOSCOPY     Social History   Social History Narrative   Not on file   Immunization History  Administered Date(s) Administered   Influenza Split 08/27/2013   Influenza,inj,Quad PF,6+ Mos 02/04/2015, 09/16/2017, 09/22/2018, 09/21/2019, 11/10/2020   PFIZER(Purple Top)SARS-COV-2 Vaccination 03/24/2020, 04/14/2020   Tdap 02/04/2015     Objective: Vital Signs: BP 131/74 (BP Location: Left  Arm, Patient Position: Sitting, Cuff Size: Normal)   Pulse 65   Resp 15   Ht _0  (1.499 m)   Wt 132 lb (59.9 kg)   BMI 26.66 kg/m    Physical Exam Vitals and nursing note reviewed.  Constitutional:      Appearance: She is well-developed.  HENT:     Head: Normocephalic and atraumatic.  Eyes:     Conjunctiva/sclera: Conjunctivae normal.  Cardiovascular:     Rate and Rhythm: Normal rate and regular rhythm.     Heart sounds: Normal heart sounds.  Pulmonary:     Effort: Pulmonary effort is normal.     Breath sounds: Normal breath sounds.  Abdominal:     General: Bowel sounds are normal.     Palpations: Abdomen is soft.  Musculoskeletal:     Cervical back: Normal range of motion.  Lymphadenopathy:     Cervical: No cervical adenopathy.  Skin:    General: Skin is warm and dry.     Capillary Refill: Capillary refill takes less than 2 seconds.  Neurological:     Mental Status: She is alert and oriented to person, place, and time.  Psychiatric:        Behavior: Behavior normal.      Musculoskeletal Exam: C-spine was in good range of motion.  She had discomfort range of motion of her lumbar spine.  Shoulder joints, elbow joints, wrist joints were in good range of motion.  She had bilateral PIP and DIP thickening with some difficulty with fist formation.  No synovitis was noted.  She had discomfort in with range of motion of her bilateral hip joints which are replaced.  There was tenderness over left trochanteric bursa.  Knee joints with good range of motion.  There was no tenderness over ankles or MTPs.  CDAI Exam: CDAI Score: -- Patient Global: --; Provider Global: -- Swollen: --; Tender: -- Joint Exam 06/09/2022   No joint exam has been documented for this visit   There is currently no information documented on the homunculus. Go to the Rheumatology activity and complete the homunculus joint exam.  Investigation: No additional findings.  Imaging: XR Lumbar Spine 2-3  Views  Result Date: 05/19/2022 No significant disc space narrowing was noted.  Anterior osteophytes were noted.  Facet joint arthropathy was noted.  SI joints were unremarkable. Impression: These findings are consistent with mild degenerative changes and facet joint arthropathy.  XR Hand 2 View Left  Result Date: 05/19/2022 PIP and DIP narrowing was noted.  No MCP, intercarpal or radiocarpal joint space narrowing was noted.  No erosive changes were noted. Impression: These findings are consistent with osteoarthritis of the hand.  XR Hand 2 View Right  Result Date: 05/19/2022 CMC, PIP and DIP narrowing was noted.  No MCP, intercarpal or radiocarpal joint space narrowing was noted.  A questionable cystic versus erosive change was noted in  the ulnar styloid. Impression: These findings are consistent with osteoarthritis of the hand.  Ulnar styloid lesion raises the concern of inflammatory arthritis.   Recent Labs: Lab Results  Component Value Date   WBC 6.3 01/15/2022   HGB 12.2 01/15/2022   PLT 349 01/15/2022   NA 132 (L) 05/14/2022   K 3.6 05/14/2022   CL 100 05/14/2022   CO2 24 05/14/2022   GLUCOSE 95 05/14/2022   BUN 23 05/14/2022   CREATININE 0.67 05/14/2022   BILITOT 0.4 05/14/2022   ALKPHOS 58 05/14/2022   AST 26 05/14/2022   ALT 27 05/14/2022   PROT 8.6 (H) 05/14/2022   ALBUMIN 4.2 05/14/2022   CALCIUM 9.4 05/14/2022   GFRAA >60 03/09/2016    Speciality Comments: No specialty comments available.  Procedures:  No procedures performed Allergies: Asa [aspirin] and Ibuprofen   Assessment / Plan:     Visit Diagnoses: Pain in both hands - H/o pain in bilateral hands DIPs with no swelling.  X-rays were consistent with osteoarthritis.  X-ray findings were discussed with the patient.  Cystic changes were noted in the carpal bones and ulnar styloid.  No synovitis was noted.  Cyclic citrullinated peptide (CCP) antibody positive - 12/08/21: RF<10, Anti-CCP 22, ESR 35.  I advised  patient to contact me if she develops joint swelling and we may consider ultrasound of her bilateral hands.  No synovitis was noted today.  Primary osteoarthritis of both hands-clinical and radiographic findings are consistent with osteoarthritis.  Joint protection muscle strengthening was discussed.  Detailed counsel regarding osteoarthritis was provided.  Lateral epicondylitis of left elbow - History of bilateral lateral epicondylitis.  She had good response to cortisone injection in the past.  No help with PT.  Tennis elbow brace prescription given.  Her pain is related to the repeated motion at work.  Pronator syndrome of right upper extremity  S/P bilateral hip replacements -she complains of some discomfort with range of motion.  I advised her to contact her orthopedic surgeon if she has ongoing pain.  Trochanteric bursitis, left hip - A handout on IT band stretches was given at the last visit.  Patient declined cortisone injection.  She continues to have some discomfort in her left trochanteric area.  I emphasized the need to do IT band stretches on a regular basis.  Primary osteoarthritis of left knee-she has intermittent discomfort.  No warmth swelling or effusion was noted.  Trapezius muscle spasm - Tenderness over left trapezius.  Arthropathy of lumbar facet joint - X-rays obtained at the last visit showed mild degenerative changes and facet joint arthropathy.  X-ray findings were discussed with the patient.  She has intermittent discomfort in the lower back.  Other medical problems are listed as follows:  Essential hypertension  History of hyperlipidemia  Prediabetes  Gastroesophageal reflux disease without esophagitis  Other specified hypothyroidism  Chronic insomnia  B12 deficiency  Family history of brain aneurysm  Orders: No orders of the defined types were placed in this encounter.  No orders of the defined types were placed in this encounter.  .  Follow-Up  Instructions: Return if symptoms worsen or fail to improve, for Osteoarthritis.   Bo Merino, MD  Note - This record has been created using Editor, commissioning.  Chart creation errors have been sought, but may not always  have been located. Such creation errors do not reflect on  the standard of medical care.

## 2022-06-09 ENCOUNTER — Encounter: Payer: Self-pay | Admitting: Rheumatology

## 2022-06-09 ENCOUNTER — Ambulatory Visit: Payer: Self-pay

## 2022-06-09 ENCOUNTER — Ambulatory Visit: Payer: BC Managed Care – PPO | Admitting: Rheumatology

## 2022-06-09 VITALS — BP 131/74 | HR 65 | Resp 15 | Ht 59.0 in | Wt 132.0 lb

## 2022-06-09 DIAGNOSIS — Z8249 Family history of ischemic heart disease and other diseases of the circulatory system: Secondary | ICD-10-CM

## 2022-06-09 DIAGNOSIS — M79642 Pain in left hand: Secondary | ICD-10-CM

## 2022-06-09 DIAGNOSIS — M545 Low back pain, unspecified: Secondary | ICD-10-CM

## 2022-06-09 DIAGNOSIS — R768 Other specified abnormal immunological findings in serum: Secondary | ICD-10-CM

## 2022-06-09 DIAGNOSIS — M7062 Trochanteric bursitis, left hip: Secondary | ICD-10-CM

## 2022-06-09 DIAGNOSIS — M19041 Primary osteoarthritis, right hand: Secondary | ICD-10-CM | POA: Diagnosis not present

## 2022-06-09 DIAGNOSIS — M7712 Lateral epicondylitis, left elbow: Secondary | ICD-10-CM | POA: Diagnosis not present

## 2022-06-09 DIAGNOSIS — M79641 Pain in right hand: Secondary | ICD-10-CM | POA: Diagnosis not present

## 2022-06-09 DIAGNOSIS — E038 Other specified hypothyroidism: Secondary | ICD-10-CM

## 2022-06-09 DIAGNOSIS — G8929 Other chronic pain: Secondary | ICD-10-CM

## 2022-06-09 DIAGNOSIS — R7303 Prediabetes: Secondary | ICD-10-CM

## 2022-06-09 DIAGNOSIS — M62838 Other muscle spasm: Secondary | ICD-10-CM

## 2022-06-09 DIAGNOSIS — I10 Essential (primary) hypertension: Secondary | ICD-10-CM

## 2022-06-09 DIAGNOSIS — K219 Gastro-esophageal reflux disease without esophagitis: Secondary | ICD-10-CM

## 2022-06-09 DIAGNOSIS — Z96643 Presence of artificial hip joint, bilateral: Secondary | ICD-10-CM

## 2022-06-09 DIAGNOSIS — F5104 Psychophysiologic insomnia: Secondary | ICD-10-CM

## 2022-06-09 DIAGNOSIS — E538 Deficiency of other specified B group vitamins: Secondary | ICD-10-CM

## 2022-06-09 DIAGNOSIS — M1712 Unilateral primary osteoarthritis, left knee: Secondary | ICD-10-CM

## 2022-06-09 DIAGNOSIS — M47816 Spondylosis without myelopathy or radiculopathy, lumbar region: Secondary | ICD-10-CM

## 2022-06-09 DIAGNOSIS — Z8639 Personal history of other endocrine, nutritional and metabolic disease: Secondary | ICD-10-CM

## 2022-06-09 DIAGNOSIS — M25552 Pain in left hip: Secondary | ICD-10-CM

## 2022-06-09 DIAGNOSIS — M19042 Primary osteoarthritis, left hand: Secondary | ICD-10-CM

## 2022-06-09 DIAGNOSIS — M25551 Pain in right hip: Secondary | ICD-10-CM

## 2022-06-09 DIAGNOSIS — G5611 Other lesions of median nerve, right upper limb: Secondary | ICD-10-CM

## 2022-06-15 ENCOUNTER — Other Ambulatory Visit: Payer: Self-pay

## 2022-06-15 ENCOUNTER — Other Ambulatory Visit: Payer: BC Managed Care – PPO

## 2022-06-15 ENCOUNTER — Inpatient Hospital Stay: Payer: BC Managed Care – PPO | Attending: Hematology and Oncology

## 2022-06-15 DIAGNOSIS — D472 Monoclonal gammopathy: Secondary | ICD-10-CM

## 2022-06-15 LAB — CBC WITH DIFFERENTIAL/PLATELET
Abs Immature Granulocytes: 0.01 10*3/uL (ref 0.00–0.07)
Basophils Absolute: 0.1 10*3/uL (ref 0.0–0.1)
Basophils Relative: 1 %
Eosinophils Absolute: 1.1 10*3/uL — ABNORMAL HIGH (ref 0.0–0.5)
Eosinophils Relative: 19 %
HCT: 36.3 % (ref 36.0–46.0)
Hemoglobin: 12 g/dL (ref 12.0–15.0)
Immature Granulocytes: 0 %
Lymphocytes Relative: 29 %
Lymphs Abs: 1.7 10*3/uL (ref 0.7–4.0)
MCH: 28.2 pg (ref 26.0–34.0)
MCHC: 33.1 g/dL (ref 30.0–36.0)
MCV: 85.2 fL (ref 80.0–100.0)
Monocytes Absolute: 0.5 10*3/uL (ref 0.1–1.0)
Monocytes Relative: 8 %
Neutro Abs: 2.5 10*3/uL (ref 1.7–7.7)
Neutrophils Relative %: 43 %
Platelets: 311 10*3/uL (ref 150–400)
RBC: 4.26 MIL/uL (ref 3.87–5.11)
RDW: 13 % (ref 11.5–15.5)
WBC: 5.8 10*3/uL (ref 4.0–10.5)
nRBC: 0 % (ref 0.0–0.2)

## 2022-06-15 LAB — CMP (CANCER CENTER ONLY)
ALT: 28 U/L (ref 0–44)
AST: 26 U/L (ref 15–41)
Albumin: 4.1 g/dL (ref 3.5–5.0)
Alkaline Phosphatase: 50 U/L (ref 38–126)
Anion gap: 4 — ABNORMAL LOW (ref 5–15)
BUN: 14 mg/dL (ref 6–20)
CO2: 24 mmol/L (ref 22–32)
Calcium: 9.1 mg/dL (ref 8.9–10.3)
Chloride: 106 mmol/L (ref 98–111)
Creatinine: 0.62 mg/dL (ref 0.44–1.00)
GFR, Estimated: >60 mL/min (ref >60)
Glucose, Bld: 111 mg/dL — ABNORMAL HIGH (ref 70–99)
Potassium: 3.9 mmol/L (ref 3.5–5.1)
Sodium: 134 mmol/L — ABNORMAL LOW (ref 135–145)
Total Bilirubin: 0.3 mg/dL (ref 0.3–1.2)
Total Protein: 8.2 g/dL — ABNORMAL HIGH (ref 6.5–8.1)

## 2022-06-21 LAB — MULTIPLE MYELOMA PANEL, SERUM
Albumin SerPl Elph-Mcnc: 3.8 g/dL (ref 2.9–4.4)
Albumin/Glob SerPl: 1.1 (ref 0.7–1.7)
Alpha 1: 0.2 g/dL (ref 0.0–0.4)
Alpha2 Glob SerPl Elph-Mcnc: 0.6 g/dL (ref 0.4–1.0)
B-Globulin SerPl Elph-Mcnc: 0.8 g/dL (ref 0.7–1.3)
Gamma Glob SerPl Elph-Mcnc: 2.2 g/dL — ABNORMAL HIGH (ref 0.4–1.8)
Globulin, Total: 3.8 g/dL (ref 2.2–3.9)
IgA: 105 mg/dL (ref 87–352)
IgG (Immunoglobin G), Serum: 2648 mg/dL — ABNORMAL HIGH (ref 586–1602)
IgM (Immunoglobulin M), Srm: 46 mg/dL (ref 26–217)
M Protein SerPl Elph-Mcnc: 1.8 g/dL — ABNORMAL HIGH
Total Protein ELP: 7.6 g/dL (ref 6.0–8.5)

## 2022-06-22 ENCOUNTER — Ambulatory Visit: Payer: BC Managed Care – PPO | Admitting: Hematology

## 2022-07-22 ENCOUNTER — Other Ambulatory Visit: Payer: Self-pay | Admitting: Internal Medicine

## 2022-07-27 ENCOUNTER — Other Ambulatory Visit: Payer: Self-pay | Admitting: Internal Medicine

## 2022-07-29 ENCOUNTER — Other Ambulatory Visit: Payer: Self-pay | Admitting: Internal Medicine

## 2022-08-03 ENCOUNTER — Other Ambulatory Visit: Payer: Self-pay | Admitting: Internal Medicine

## 2022-08-10 ENCOUNTER — Ambulatory Visit (INDEPENDENT_AMBULATORY_CARE_PROVIDER_SITE_OTHER): Payer: BC Managed Care – PPO

## 2022-08-10 ENCOUNTER — Ambulatory Visit: Payer: BC Managed Care – PPO | Admitting: Orthopaedic Surgery

## 2022-08-10 DIAGNOSIS — M5416 Radiculopathy, lumbar region: Secondary | ICD-10-CM | POA: Diagnosis not present

## 2022-08-10 MED ORDER — DICLOFENAC SODIUM 75 MG PO TBEC: 75.0000 mg | DELAYED_RELEASE_TABLET | Freq: Two times a day (BID) | ORAL | 2 refills | Status: DC

## 2022-08-10 NOTE — Progress Notes (Signed)
Office Visit Note   Patient: Maria Barker           Date of Birth: 1961-01-02           MRN: [id number] Visit Date: 08/10/2022              Requested by: Binnie Rail, MD [place],  [place] [id number] PCP: Binnie Rail, MD   Assessment & Plan: Visit Diagnoses:  1. Lumbar radiculopathy     Plan: Impression is intermittent low back pain in addition to left anterior thigh numbness.  I believe patient's symptoms are likely coming from her lumbar spine.  We discussed starting on the anti-inflammatory.  We have also discussed sending her to physical therapy versus working on a home exercise program.  She would like to try the home exercises for now.  Spine conditioning program provided.  Follow-up as needed.  Follow-Up Instructions: Return if symptoms worsen or fail to improve.   Orders:  Orders Placed This Encounter  Procedures   XR HIP UNILAT W OR W/O PELVIS 1V LEFT   Meds ordered this encounter  Medications   diclofenac (VOLTAREN) 75 MG EC tablet    Sig: Take 1 tablet (75 mg total) by mouth 2 (two) times daily.    Dispense:  60 tablet    Refill:  2      Procedures: No procedures performed   Clinical Data: No additional findings.   Subjective: Chief Complaint  Patient presents with   Left Hip - Pain    HPI patient is a pleasant 61 year old female who comes in today with chronic intermittent low back pain and numbness to the left anterior thigh for the past 6 months or so.  She denies any injury or change in activity.  Her symptoms appear to worsen when she is standing for a long time but did improve after sitting down for a few minutes.  She has not been taking medication for this.  No previous ESI.  She has been to physical therapy for other issues in the past but not for her back.  Review of Systems as detailed in HPI.  All others reviewed and are negative.   Objective: Vital Signs: There were no vitals taken for this visit.  Physical Exam  well-developed well-nourished female no acute distress.  Alert and oriented x3.  Ortho Exam lumbar spine exam shows no spinous or paraspinous tenderness.  No pain with lumbar flexion or extension.  Negative straight leg raise.  No focal weakness.  She is neurovascular tact distally.  Specialty Comments:  No specialty comments available.  Imaging: Previous x-rays of the lumbar spine reviewed by me in [place] show mild degenerative changes   PMFS History: Patient Active Problem List   Diagnosis Date Noted   Acute cystitis 04/26/2022   Allergic rhinitis 12/21/2021   Fatigue 12/21/2021   Lateral epicondylitis of left elbow 10/02/2021   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   Pain of both elbows 05/11/2021   Left leg pain 05/11/2021   Osteopenia - Dr Corinna Capra 11/27/2020   Left hip pain 05/08/2020   Epigastric pain 03/28/2019   Rash on lips 01/08/2019   Essential hypertension 01/08/2019   De Quervain's tenosynovitis, right 03/09/2018   Prediabetes 09/17/2017   Osteoarthritis of left knee 04/12/2017   B12 deficiency 08/13/2016   S/P bilateral hip replacements 08/13/2016   Family history of brain aneurysm 08/12/2015   Gastroesophageal reflux disease without esophagitis 08/12/2015   Hypothyroid 02/06/2015  Hyperlipidemia    Chronic insomnia 04/13/2013   Past Medical History:  Diagnosis Date   Arthritis    R THR 2011   Back pain    Frequent headaches    Hyperlipidemia LDL goal < 130    Hypertension    Hypothyroid 02/06/2015   Dx 01/2015: TSH 5.4   Insomnia    Left hip pain    Pain of left thigh     Family History  Problem Relation Age of Onset   CVA Mother    Hypertension Mother    CVA Sister     Past Surgical History:  Procedure Laterality Date   TOTAL HIP ARTHROPLASTY Right 03/31/10   olin   TOTAL HIP ARTHROPLASTY Left 03/08/2016   Procedure: TOTAL HIP ARTHROPLASTY;  Surgeon: Frederik Pear, MD;  Location: [facility];  Service: Orthopedics;  Laterality: Left;    TUBAL LIGATION     UPPER GASTROINTESTINAL ENDOSCOPY     Social History   Occupational History   Occupation: Glass blower/designer    Employer: [employer]  Tobacco Use   Smoking status: Never    Passive exposure: Never   Smokeless tobacco: Never  Vaping Use   Vaping Use: Never used  Substance and Sexual Activity   Alcohol use: No   Drug use: No   Sexual activity: Not on file

## 2022-11-05 ENCOUNTER — Other Ambulatory Visit: Payer: Self-pay | Admitting: Internal Medicine

## 2022-11-05 NOTE — Progress Notes (Unsigned)
   I, Peterson Lombard, LAT, ATC acting as a scribe for Maria Leader, MD.  Maria Barker is a 61 y.o. female who presents to [place] at [facility] today for R elbow/arm pain. Pt was last seen by Dr. Georgina Snell on 10/30/21 for bilat elbow pain and pain had significantly improved after her left lateral condyle steroid injection on 10/02/2021.  Today, patient reports a few months ago she started to have pain along the anterior-proximal forearm, into the cubital fossa, and distal biceps. 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  Possible inflammatory arthritis not currently treated per rheumatology.  Majority of her symptoms thought to be osteoarthritis not inflammatory arthritis   Exam:  BP (!) 150/76   Pulse (!) 58   Ht _0  (1.499 m)   Wt 134 lb (60.8 kg)   SpO2 98%   BMI 27.06 kg/m  General: Well Developed, well nourished, and in no acute distress.   MSK: Right elbow normal-appearing Normal motion. Tender palpation at distal biceps tendon antecubital fossa. Tender palpation at biceps tendon insertion onto distal radius. Pain with resisted elbow flexion and resisted supination. Wrist motion is not particularly painful. Pulses cap refill and sensation are intact distally.    Lab and Radiology Results  X-ray images right elbow obtained today personally and independently interpreted No acute fractures.  No aggressive appearing bony lesions. Await formal radiology review   Assessment and Plan: 61 y.o. female with right elbow pain.  Patient has a history of lateral epicondylitis of the right elbow previously treated with injection and home exercise program.  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  Today symptoms are much more consistent with distal biceps tendinitis.  Plan to treat with nitroglycerin patch protocol and home exercise program taught in clinic today by ATC.  We will check back in a month.  If not improved hand therapy may be helpful.  She is reluctant to consider formal therapy and she is she attributes dry needling to worsening or causing her current symptoms.     PDMP not reviewed this encounter. Orders Placed This Encounter  Procedures   DG ELBOW COMPLETE RIGHT (3+VIEW)    Standing Status:   Future    Number of Occurrences:   1    Standing Expiration Date:   11/09/2023    Order Specific Question:   Reason for Exam (SYMPTOM  OR DIAGNOSIS REQUIRED)    Answer:   eval elbow pain    Order Specific Question:   Is patient pregnant?    Answer:   No    Order Specific Question:   Preferred imaging location?    Answer:   [facility]   Meds ordered this encounter  Medications   nitroGLYCERIN (NITRODUR - DOSED IN MG/24 HR) 0.2 mg/hr patch    Sig: Apply 1/4 patch daily to tendon for tendonitis.    Dispense:  30 patch    Refill:  1     Discussed warning signs or symptoms. Please see discharge instructions. Patient expresses understanding.   The above documentation has been reviewed and is accurate and complete Maria Barker, M.D.

## 2022-11-08 ENCOUNTER — Ambulatory Visit: Payer: BC Managed Care – PPO | Admitting: Family Medicine

## 2022-11-08 ENCOUNTER — Ambulatory Visit (INDEPENDENT_AMBULATORY_CARE_PROVIDER_SITE_OTHER): Payer: BC Managed Care – PPO

## 2022-11-08 ENCOUNTER — Ambulatory Visit: Payer: Self-pay

## 2022-11-08 VITALS — BP 150/76 | HR 58 | Ht 59.0 in | Wt 134.0 lb

## 2022-11-08 DIAGNOSIS — S59901A Unspecified injury of right elbow, initial encounter: Secondary | ICD-10-CM | POA: Diagnosis not present

## 2022-11-08 DIAGNOSIS — M7521 Bicipital tendinitis, right shoulder: Secondary | ICD-10-CM

## 2022-11-08 DIAGNOSIS — G5611 Other lesions of median nerve, right upper limb: Secondary | ICD-10-CM

## 2022-11-08 DIAGNOSIS — M25521 Pain in right elbow: Secondary | ICD-10-CM | POA: Diagnosis not present

## 2022-11-08 MED ORDER — NITROGLYCERIN 0.2 MG/HR TD PT24: MEDICATED_PATCH | TRANSDERMAL | 1 refills | Status: DC

## 2022-11-08 NOTE — Patient Instructions (Addendum)
Thank you for coming in today.   Please complete the exercises that the athletic trainer went over with you:  View at [email or web address] using code: [email or web address]  Please get an Xray today before you leave   Check back in 1 month

## 2022-11-09 NOTE — Progress Notes (Signed)
Right elbow x-ray shows some mild arthritis changes.

## 2022-11-11 ENCOUNTER — Encounter: Payer: Self-pay | Admitting: Internal Medicine

## 2022-11-11 NOTE — Progress Notes (Signed)
Subjective:    Patient ID: Maria Barker, female    DOB: 09-09-1961, 61 y.o.   MRN: [id number]     HPI Maria Barker is here for follow up of her chronic medical problems, including htn, hld, prediabetes, hypothyroid, insomnia, GERD  Not currently taking anything for GERD.  Sleep is off and on.    Medications and allergies reviewed with patient and updated if appropriate.  Current Outpatient Medications on File Prior to Visit  Medication Sig Dispense Refill   atorvastatin (LIPITOR) 10 MG tablet TAKE 1 TABLET BY MOUTH ONCE DAILY 90 tablet 1   Calcium Carbonate-Vitamin D (CALCIUM-VITAMIN D3 PO) Take 1 each by mouth 2 (two) times daily.     estradiol (ESTRACE) 0.1 MG/GM vaginal cream Place vaginally.     levothyroxine (EUTHYROX) 50 MCG tablet TAKE 1 TABLET BY MOUTH ONCE DAILY 6  DAYS  A  WEEK 90 tablet 2   melatonin 5 MG TABS Take 5 mg by mouth daily as needed.     olmesartan (BENICAR) 5 MG tablet Take 1 tablet by mouth once daily 90 tablet 0   Omega-3 1000 MG CAPS Take 2,000 mg by mouth.     nitroGLYCERIN (NITRODUR - DOSED IN MG/24 HR) 0.2 mg/hr patch Apply 1/4 patch daily to tendon for tendonitis. (Patient not taking: Reported on 11/12/2022) 30 patch 1   No current facility-administered medications on file prior to visit.     Review of Systems  Constitutional:  Negative for fever.  Respiratory:  Positive for cough (dry). Negative for shortness of breath and wheezing.   Cardiovascular:  Negative for chest pain, palpitations and leg swelling.  Musculoskeletal:        Elbow pain  Neurological:  Positive for light-headedness (when she does not get enough sleep). Negative for headaches.       Objective:   Vitals:   11/12/22 0949  BP: 130/86  Pulse: 60  Temp: 98 F (36.7 C)  SpO2: 98%   BP Readings from Last 3 Encounters:  11/12/22 130/86  11/08/22 (!) 150/76  06/09/22 131/74   Wt Readings from Last 3 Encounters:  11/12/22 133 lb (60.3 kg)  11/08/22 134 lb (60.8 kg)   06/09/22 132 lb (59.9 kg)   Body mass index is 26.86 kg/m.    Physical Exam Constitutional:      General: She is not in acute distress.    Appearance: Normal appearance.  HENT:     Head: Normocephalic and atraumatic.  Eyes:     Conjunctiva/sclera: Conjunctivae normal.  Cardiovascular:     Rate and Rhythm: Normal rate and regular rhythm.     Heart sounds: Normal heart sounds. No murmur heard. Pulmonary:     Effort: Pulmonary effort is normal. No respiratory distress.     Breath sounds: Normal breath sounds. No wheezing.  Musculoskeletal:     Cervical back: Neck supple.     Right lower leg: No edema.     Left lower leg: No edema.  Lymphadenopathy:     Cervical: No cervical adenopathy.  Skin:    General: Skin is warm and dry.     Findings: No rash.  Neurological:     Mental Status: She is alert. Mental status is at baseline.  Psychiatric:        Mood and Affect: Mood normal.        Behavior: Behavior normal.        Lab Results  Component Value Date   WBC 5.8 06/15/2022  HGB 12.0 06/15/2022   HCT 36.3 06/15/2022   PLT 311 06/15/2022   GLUCOSE 111 (H) 06/15/2022   CHOL 178 05/14/2022   TRIG 81.0 05/14/2022   HDL 68.20 05/14/2022   LDLDIRECT 93.7 12/21/2013   LDLCALC 93 05/14/2022   ALT 28 06/15/2022   AST 26 06/15/2022   NA 134 (L) 06/15/2022   K 3.9 06/15/2022   CL 106 06/15/2022   CREATININE 0.62 06/15/2022   BUN 14 06/15/2022   CO2 24 06/15/2022   TSH 0.80 05/14/2022   INR 1.18 02/27/2016   HGBA1C 6.1 05/14/2022     Assessment & Plan:    See Problem List for Assessment and Plan of chronic medical problems.

## 2022-11-11 NOTE — Patient Instructions (Addendum)
    Flu immunization administered today.     Blood work was ordered.   The lab is on the first floor.    Medications changes include :   none      Return in about 6 months (around 05/13/2023) for Physical Exam.

## 2022-11-12 ENCOUNTER — Ambulatory Visit: Payer: BC Managed Care – PPO | Admitting: Internal Medicine

## 2022-11-12 VITALS — BP 130/86 | HR 60 | Temp 98.0°F | Ht 59.0 in | Wt 133.0 lb

## 2022-11-12 DIAGNOSIS — R7303 Prediabetes: Secondary | ICD-10-CM | POA: Diagnosis not present

## 2022-11-12 DIAGNOSIS — E782 Mixed hyperlipidemia: Secondary | ICD-10-CM

## 2022-11-12 DIAGNOSIS — E038 Other specified hypothyroidism: Secondary | ICD-10-CM | POA: Diagnosis not present

## 2022-11-12 DIAGNOSIS — K219 Gastro-esophageal reflux disease without esophagitis: Secondary | ICD-10-CM | POA: Diagnosis not present

## 2022-11-12 DIAGNOSIS — I10 Essential (primary) hypertension: Secondary | ICD-10-CM | POA: Diagnosis not present

## 2022-11-12 DIAGNOSIS — F5104 Psychophysiologic insomnia: Secondary | ICD-10-CM

## 2022-11-12 DIAGNOSIS — Z23 Encounter for immunization: Secondary | ICD-10-CM | POA: Diagnosis not present

## 2022-11-12 LAB — TSH: TSH: 3.42 u[IU]/mL (ref 0.35–5.50)

## 2022-11-12 LAB — COMPREHENSIVE METABOLIC PANEL
ALT: 33 U/L (ref 0–35)
AST: 29 U/L (ref 0–37)
Albumin: 4.3 g/dL (ref 3.5–5.2)
Alkaline Phosphatase: 52 U/L (ref 39–117)
BUN: 18 mg/dL (ref 6–23)
CO2: 25 mEq/L (ref 19–32)
Calcium: 9 mg/dL (ref 8.4–10.5)
Chloride: 102 mEq/L (ref 96–112)
Creatinine, Ser: 0.62 mg/dL (ref 0.40–1.20)
GFR: 97.47 mL/min (ref >60.00)
Glucose, Bld: 88 mg/dL (ref 70–99)
Potassium: 3.9 mEq/L (ref 3.5–5.1)
Sodium: 132 mEq/L — ABNORMAL LOW (ref 135–145)
Total Bilirubin: 0.4 mg/dL (ref 0.2–1.2)
Total Protein: 8.4 g/dL — ABNORMAL HIGH (ref 6.0–8.3)

## 2022-11-12 LAB — LIPID PANEL
Cholesterol: 156 mg/dL (ref 0–200)
HDL: 71.2 mg/dL (ref >39.00)
LDL Cholesterol: 71 mg/dL (ref 0–99)
NonHDL: 85.17
Total CHOL/HDL Ratio: 2
Triglycerides: 72 mg/dL (ref 0.0–149.0)
VLDL: 14.4 mg/dL (ref 0.0–40.0)

## 2022-11-12 LAB — HEMOGLOBIN A1C: Hgb A1c MFr Bld: 6.3 % (ref 4.6–6.5)

## 2022-11-12 NOTE — Assessment & Plan Note (Addendum)
Chronic Blood pressure borderline controlled - if elevated next visit will increase benicar CMP Continue Benicar 5 mg daily

## 2022-11-12 NOTE — Addendum Note (Signed)
Addended by: Marcina Millard on: 11/12/2022 04:22 PM   Modules accepted: Orders

## 2022-11-12 NOTE — Assessment & Plan Note (Signed)
Chronic Check a1c Low sugar / carb diet Stressed regular exercise  

## 2022-11-12 NOTE — Assessment & Plan Note (Signed)
Chronic Has tried several medications in the past that were not effective Stressed good sleep hygiene

## 2022-11-12 NOTE — Assessment & Plan Note (Signed)
Chronic  Clinically euthyroid Check tsh and will titrate med dose if needed Currently taking levothyroxine 50 mcg 6 days a week

## 2022-11-12 NOTE — Assessment & Plan Note (Signed)
Chronic Regular exercise and healthy diet encouraged Check lipid panel  Continue atorvastatin 10 mg daily 

## 2022-11-26 ENCOUNTER — Other Ambulatory Visit: Payer: Self-pay | Admitting: Internal Medicine

## 2022-12-09 NOTE — Progress Notes (Signed)
   I, Peterson Lombard, LAT, ATC acting as a scribe for [name], MD.  Maria Barker is a 61 y.o. female who presents to [facility] today for f/u right elbow pain thought to be do to distal biceps tendinitis. Pt was last seen by Dr. Georgina Snell on 11/08/22 and was prescribed nitro patches and taught HEP. Today, pt reports R elbow feels slightly better, but pain continues, not as severe. Pt tried the nitro patches, but had to d/c due to HA.   Dx testing:  11/08/22 R elbow XR 09/16/21 B UE NCV/EMG    Pertinent review of systems: No fevers or chills  Relevant historical information: Hypertension currently controlled.  Patient is intolerant to conventional NSAIDs as they have caused stomach irritation in the past.   Exam:  BP 112/78   Pulse 62   Ht '4\' 11"'$  (1.499 m)   Wt 133 lb (60.3 kg)   SpO2 96%   BMI 26.86 kg/m  General: Well Developed, well nourished, and in no acute distress.   MSK: Right elbow normal-appearing Normal elbow motion.  Mildly tender palpation distal biceps tendon.  Intact strength.    Assessment and Plan: 61 y.o. female with right anterior elbow pain thought to be due to distal biceps tendinitis.  Improving with home exercise program. Plan to treat with continued home exercise program.  Recommend continued Tylenol arthritis.  I have prescribed low-dose Celebrex to use as needed if she has a bad day.  This should be more tolerable than more conventional NSAIDs.  Additionally Voltaren gel is a good idea as well.  Recheck back as needed.  PDMP not reviewed this encounter. No orders of the defined types were placed in this encounter.  Meds ordered this encounter  Medications   celecoxib (CELEBREX) 100 MG capsule    Sig: Take 1 capsule (100 mg total) by mouth 2 (two) times daily as needed for moderate pain (severe pain).    Dispense:  60 capsule    Refill:  1     Discussed warning signs or symptoms. Please see discharge instructions.  Patient expresses understanding.   The above documentation has been reviewed and is accurate and complete [name], M.D.

## 2022-12-10 ENCOUNTER — Ambulatory Visit: Payer: BC Managed Care – PPO | Admitting: Family Medicine

## 2022-12-10 VITALS — BP 112/78 | HR 62 | Ht 59.0 in | Wt 133.0 lb

## 2022-12-10 DIAGNOSIS — M7521 Bicipital tendinitis, right shoulder: Secondary | ICD-10-CM

## 2022-12-10 MED ORDER — CELECOXIB 100 MG PO CAPS: 100.0000 mg | ORAL_CAPSULE | Freq: Two times a day (BID) | ORAL | 1 refills | Status: DC | PRN

## 2022-12-10 NOTE — Patient Instructions (Signed)
Thank you for coming in today.   Continue the exercises.   Continue voltraren gel.   Use cleebrex for severe pain.   Use tylenol arthritis 650. You can take 2 of those every 8 hours as needed for pain.   Recheck with me as needed

## 2022-12-28 ENCOUNTER — Other Ambulatory Visit: Payer: Self-pay | Admitting: Internal Medicine

## 2023-01-26 ENCOUNTER — Other Ambulatory Visit: Payer: Self-pay | Admitting: Internal Medicine

## 2023-02-03 ENCOUNTER — Other Ambulatory Visit: Payer: Self-pay | Admitting: Internal Medicine

## 2023-03-23 ENCOUNTER — Other Ambulatory Visit: Payer: Self-pay | Admitting: Internal Medicine

## 2023-03-23 ENCOUNTER — Other Ambulatory Visit: Payer: Self-pay

## 2023-04-04 NOTE — Progress Notes (Unsigned)
Office Visit Note   Patient: Maria Barker           Date of Birth: [birth year]           MRN: [id number] Visit Date: 04/05/2023              Requested by: Pincus Sanes, MD [address] [place],  [place] [address] PCP: Pincus Sanes, MD   Assessment & Plan: Visit Diagnoses: No diagnosis found.  Plan: ***  Follow-Up Instructions: No follow-ups on file.   Orders:  No orders of the defined types were placed in this encounter.  No orders of the defined types were placed in this encounter.     Procedures: No procedures performed   Clinical Data: No additional findings.   Subjective: No chief complaint on file.   HPI  Review of Systems  Constitutional: Negative.   HENT: Negative.    Eyes: Negative.   Respiratory: Negative.    Cardiovascular: Negative.   Endocrine: Negative.   Musculoskeletal: Negative.   Neurological: Negative.   Hematological: Negative.   Psychiatric/Behavioral: Negative.    All other systems reviewed and are negative.   Objective: Vital Signs: There were no vitals taken for this visit.  Physical Exam Vitals and nursing note reviewed.  Constitutional:      Appearance: She is well-developed.  HENT:     Head: Normocephalic and atraumatic.  Pulmonary:     Effort: Pulmonary effort is normal.  Abdominal:     Palpations: Abdomen is soft.  Musculoskeletal:     Cervical back: Neck supple.  Skin:    General: Skin is warm.     Capillary Refill: Capillary refill takes less than 2 seconds.  Neurological:     Mental Status: She is alert and oriented to person, place, and time.  Psychiatric:        Behavior: Behavior normal.        Thought Content: Thought content normal.        Judgment: Judgment normal.   Ortho Exam  Specialty Comments:  No specialty comments available.  Imaging: No results found.   PMFS History: Patient Active Problem List   Diagnosis Date Noted  . Biceps tendonitis on right 11/08/2022  . Acute cystitis  04/26/2022  . Allergic rhinitis 12/21/2021  . Fatigue 12/21/2021  . Lateral epicondylitis of left elbow 10/02/2021  . 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 Pain of both elbows 05/11/2021  . Left leg pain 05/11/2021  . Osteopenia - Dr Rana Snare 11/27/2020  . Left hip pain 05/08/2020  . Epigastric pain 03/28/2019  . Rash on lips 01/08/2019  . Essential hypertension 01/08/2019  . De Quervain's tenosynovitis, right 03/09/2018  . Prediabetes 09/17/2017  . Osteoarthritis of left knee 04/12/2017  . B12 deficiency 08/13/2016  . S/P bilateral hip replacements 08/13/2016  . Family history of brain aneurysm 08/12/2015  . Gastroesophageal reflux disease without esophagitis 08/12/2015  . Hypothyroid 02/06/2015  . Hyperlipidemia   . Chronic insomnia 04/13/2013   Past Medical History:  Diagnosis Date  . Arthritis    R THR 2011  . Back pain   . Frequent headaches   . Hyperlipidemia LDL goal < 130   . Hypertension   . Hypothyroid 02/06/2015   Dx 01/2015: TSH 5.4  . Insomnia   . Left hip pain   . Pain of left thigh     Family History  Problem Relation Age of Onset  . CVA Mother   . Hypertension  Mother   . CVA Sister     Past Surgical History:  Procedure Laterality Date  . 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 TOTAL HIP ARTHROPLASTY Left 03/08/2016   Procedure: TOTAL HIP ARTHROPLASTY;  Surgeon: Gean Birchwood, MD;  Location: [facility] OR;  Service: Orthopedics;  Laterality: Left;  . TUBAL LIGATION    . UPPER GASTROINTESTINAL ENDOSCOPY     Social History   Occupational History  . Occupation: Chief of Staff: [employer]  Tobacco Use  . Smoking status: Never    Passive exposure: Never  . Smokeless tobacco: Never  Vaping Use  . Vaping Use: Never used  Substance and Sexual Activity  . Alcohol use: No  . Drug use: No  . Sexual activity: Not on file

## 2023-04-05 ENCOUNTER — Ambulatory Visit: Payer: BC Managed Care – PPO | Admitting: Orthopaedic Surgery

## 2023-04-05 DIAGNOSIS — M25551 Pain in right hip: Secondary | ICD-10-CM

## 2023-04-05 MED ORDER — TIZANIDINE HCL 4 MG PO TABS: 4.0000 mg | ORAL_TABLET | Freq: Four times a day (QID) | ORAL | 2 refills | Status: AC | PRN

## 2023-04-05 MED ORDER — DICLOFENAC SODIUM 75 MG PO TBEC: 75.0000 mg | DELAYED_RELEASE_TABLET | Freq: Two times a day (BID) | ORAL | 2 refills | Status: AC

## 2023-04-12 IMAGING — CT NM PET TUM IMG INITIAL (PI) WHOLE BODY
1 of 7 series · 4 of 25 positions shown · non-contrast
Comparison: None.

CLINICAL DATA: Initial treatment strategy for monoclonal
paraproteinemia concerning for myeloma.

EXAM:
NUCLEAR MEDICINE PET WHOLE BODY
TECHNIQUE: 6.5 mCi F-18 FDG was injected intravenously. Full-ring PET imaging
was performed from the head to foot after the radiotracer. CT data
was obtained and used for attenuation correction and anatomic
localization.
Fasting blood glucose: 103 mg/dl

[Series 4: ct wb 5.0 bf37 · axial · 5.0mm · 0.98mm/px · z∈[+208,+1180]mm · 4 of 407 slices shown]
[im 82/407  soft-tissue]
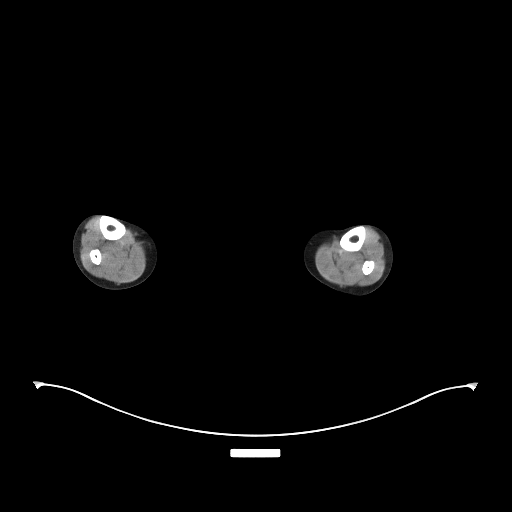
[im 163/407  soft-tissue]
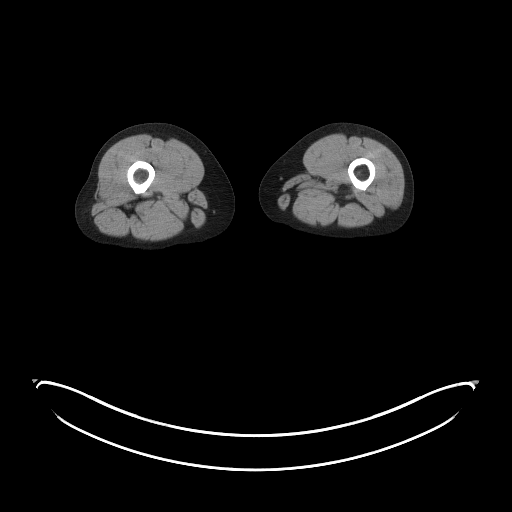
[im 244/407  soft-tissue]
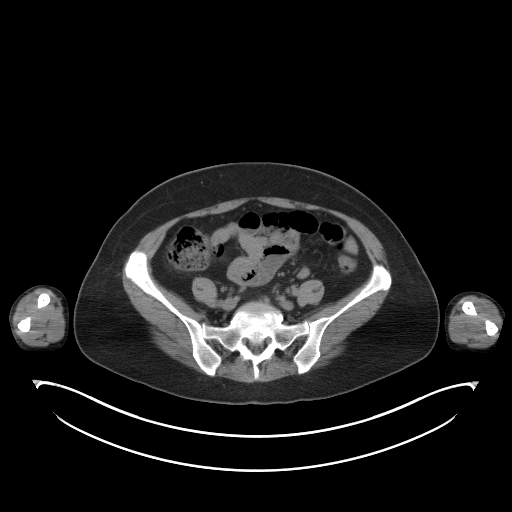
[im 325/407  soft-tissue]
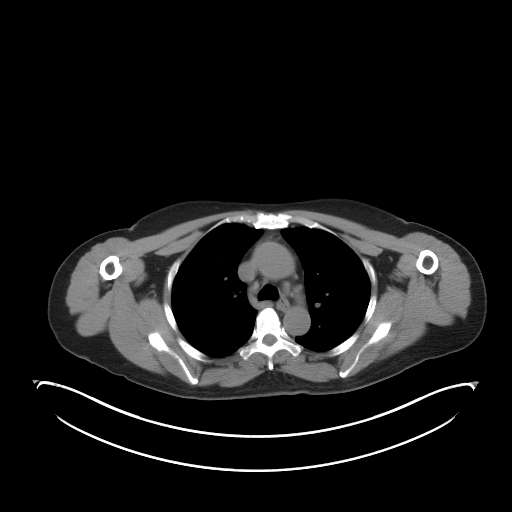

[4 of 25 positions shown; findings below may reference images not displayed]

FINDINGS: Mediastinal blood pool activity: SUV max

HEAD/NECK: No hypermetabolic activity in the scalp. No
hypermetabolic cervical lymph nodes.

Incidental CT findings: none

CHEST: No hypermetabolic mediastinal or hilar nodes. No suspicious
pulmonary nodules on the CT scan.

Incidental CT findings: Subtle tree-in-bud nodularity noted left
lower lobe (image [DATE]).

ABDOMEN/PELVIS: No abnormal hypermetabolic activity within the
liver, pancreas, adrenal glands, or spleen. No hypermetabolic lymph
nodes in the abdomen or pelvis.

Incidental CT findings: Possible

Saccular aneurysms in the distal renal artery measuring 12 and 11 mm
diameter respectively. Anatomy in the inferior pelvis obscured by
beam hardening artifact from bilateral hip replacement.

SKELETON: No focal hypermetabolic activity to suggest skeletal
metastasis. Symmetric low level uptake in the L3-4 facets is likely
degenerative.

Incidental CT findings: Status post bilateral hip replacement. Tiny
sclerotic focus in the T6 vertebral body demonstrates no
hypermetabolism.

EXTREMITIES: No abnormal hypermetabolic activity in the lower
extremities.

Incidental CT findings: none
IMPRESSION: 1. No suspicious hypermetabolic soft tissue or osseous uptake on
today's study.
2. Possible pair of tiny saccular aneurysms distal splenic artery
not well evaluated due to motion artifact and noncontrast CT
imaging. Consider follow-up CT abdomen in 1 year to ensure
stability.

## 2023-04-26 DIAGNOSIS — Z13820 Encounter for screening for osteoporosis: Secondary | ICD-10-CM | POA: Diagnosis not present

## 2023-04-26 DIAGNOSIS — Z6826 Body mass index (BMI) 26.0-26.9, adult: Secondary | ICD-10-CM | POA: Diagnosis not present

## 2023-04-26 DIAGNOSIS — Z1231 Encounter for screening mammogram for malignant neoplasm of breast: Secondary | ICD-10-CM | POA: Diagnosis not present

## 2023-04-26 DIAGNOSIS — Z01419 Encounter for gynecological examination (general) (routine) without abnormal findings: Secondary | ICD-10-CM | POA: Diagnosis not present

## 2023-04-26 DIAGNOSIS — Z124 Encounter for screening for malignant neoplasm of cervix: Secondary | ICD-10-CM | POA: Diagnosis not present

## 2023-04-26 LAB — HM DEXA SCAN

## 2023-04-28 ENCOUNTER — Other Ambulatory Visit: Payer: Self-pay | Admitting: Internal Medicine

## 2023-05-04 ENCOUNTER — Other Ambulatory Visit: Payer: Self-pay | Admitting: Internal Medicine

## 2023-05-12 ENCOUNTER — Encounter: Payer: Self-pay | Admitting: Internal Medicine

## 2023-05-12 NOTE — Progress Notes (Signed)
Subjective:    Patient ID: Donald Prose, female    DOB: September 29, 1961, 62 y.o.   MRN: [id number]      HPI Shakora is here for a Physical exam and her chronic medical problems.   Lower back pain down both legs, cramping in legs.  Was initially just the left side but now both sides.  No N/T.  The pain down both legs started last month.  Tylenol helped a little.   She did see Dr Roda Shutters for this - she was prescribed diclofenac - upset her stomach and tizanidine made her too drowsy during the  day and kept her up at night.      Medications and allergies reviewed with patient and updated if appropriate.  Current Outpatient Medications on File Prior to Visit  Medication Sig Dispense Refill   atorvastatin (LIPITOR) 10 MG tablet Take 1 tablet by mouth once daily 90 tablet 0   Calcium Carbonate-Vitamin D (CALCIUM-VITAMIN D3 PO) Take 1 each by mouth 2 (two) times daily.     celecoxib (CELEBREX) 100 MG capsule Take 1 capsule (100 mg total) by mouth 2 (two) times daily as needed for moderate pain (severe pain). 60 capsule 1   diclofenac (VOLTAREN) 75 MG EC tablet Take 1 tablet (75 mg total) by mouth 2 (two) times daily. 30 tablet 2   estradiol (ESTRACE) 0.1 MG/GM vaginal cream Place vaginally.     levothyroxine (SYNTHROID) 50 MCG tablet TAKE 1 TABLET BY MOUTH ONCE DAILY FOR 6 DAYS FOR  WEEK 30 tablet 0   melatonin 5 MG TABS Take 5 mg by mouth daily as needed.     olmesartan (BENICAR) 5 MG tablet Take 1 tablet by mouth once daily 90 tablet 0   Omega-3 1000 MG CAPS Take 2,000 mg by mouth.     tiZANidine (ZANAFLEX) 4 MG tablet Take 1 tablet (4 mg total) by mouth every 6 (six) hours as needed for muscle spasms. 30 tablet 2   No current facility-administered medications on file prior to visit.    Review of Systems  Constitutional:  Negative for fever.  Eyes:  Negative for visual disturbance.  Respiratory:  Negative for cough, shortness of breath and wheezing.   Cardiovascular:  Negative for chest pain,  palpitations and leg swelling.  Gastrointestinal:  Negative for abdominal pain, blood in stool, constipation and diarrhea.       No gerd  Genitourinary:  Negative for dysuria.  Musculoskeletal:  Positive for arthralgias (left knee) and back pain.  Skin:  Negative for rash.  Neurological:  Positive for dizziness (occ) and headaches (occ). Negative for light-headedness.  Psychiatric/Behavioral:  Negative for dysphoric mood. The patient is not nervous/anxious.        Objective:   Vitals:   05/13/23 1032  BP: 120/80  Pulse: 60  Temp: 98 F (36.7 C)  SpO2: 95%   Filed Weights   05/13/23 1032  Weight: 130 lb (59 kg)   Body mass index is 26.26 kg/m.  BP Readings from Last 3 Encounters:  05/13/23 120/80  12/10/22 112/78  11/12/22 130/86    Wt Readings from Last 3 Encounters:  05/13/23 130 lb (59 kg)  12/10/22 133 lb (60.3 kg)  11/12/22 133 lb (60.3 kg)       Physical Exam Constitutional: She appears well-developed and well-nourished. No distress.  HENT:  Head: Normocephalic and atraumatic.  Right Ear: External ear normal. Normal ear canal and TM Left Ear: External ear normal.  Normal ear canal and  TM Mouth/Throat: Oropharynx is clear and moist.  Eyes: Conjunctivae normal.  Neck: Neck supple. No tracheal deviation present. No thyromegaly present.  No carotid bruit  Cardiovascular: Normal rate, regular rhythm and normal heart sounds.   No murmur heard.  No edema. Pulmonary/Chest: Effort normal and breath sounds normal. No respiratory distress. She has no wheezes. She has no rales.  Breast: deferred   Abdominal: Soft. She exhibits no distension. There is no tenderness.  Lymphadenopathy: She has no cervical adenopathy.  Skin: Skin is warm and dry. She is not diaphoretic.  Psychiatric: She has a normal mood and affect. Her behavior is normal.     Lab Results  Component Value Date   WBC 5.8 06/15/2022   HGB 12.0 06/15/2022   HCT 36.3 06/15/2022   PLT 311  06/15/2022   GLUCOSE 88 11/12/2022   CHOL 156 11/12/2022   TRIG 72.0 11/12/2022   HDL 71.20 11/12/2022   LDLDIRECT 93.7 12/21/2013   LDLCALC 71 11/12/2022   ALT 33 11/12/2022   AST 29 11/12/2022   NA 132 (L) 11/12/2022   K 3.9 11/12/2022   CL 102 11/12/2022   CREATININE 0.62 11/12/2022   BUN 18 11/12/2022   CO2 25 11/12/2022   TSH 3.42 11/12/2022   INR 1.18 02/27/2016   HGBA1C 6.3 11/12/2022         Assessment & Plan:   Physical exam: Screening blood work  ordered Exercise not regular Weight  normal Substance abuse  none   Reviewed recommended immunizations.   Health Maintenance  Topic Date Due   COVID-19 Vaccine (3 - 2023-24 season) 08/27/2022   MAMMOGRAM  09/10/2022   PAP SMEAR-Modifier  05/13/2023   INFLUENZA VACCINE  07/28/2023   COLONOSCOPY (Pts 45-64yrs Insurance coverage will need to be confirmed)  09/05/2023   DEXA SCAN  09/11/2023   DTaP/Tdap/Td (2 - Td or Tdap) 02/04/2025   Hepatitis C Screening  Completed   HIV Screening  Completed   HPV VACCINES  Aged Out   Zoster Vaccines- Shingrix  Discontinued          See Problem List for Assessment and Plan of chronic medical problems.

## 2023-05-12 NOTE — Patient Instructions (Addendum)
Blood work was ordered.   The lab is on the first floor.    Medications changes include :   Gabapentin 100-200 mg at night for the nerve pain in your legs.      Return in about 6 months (around 11/13/2023) for follow up.   Health Maintenance, Female Adopting a healthy lifestyle and getting preventive care are important in promoting health and wellness. Ask your health care provider about: The right schedule for you to have regular tests and exams. Things you can do on your own to prevent diseases and keep yourself healthy. What should I know about diet, weight, and exercise? Eat a healthy diet  Eat a diet that includes plenty of vegetables, fruits, low-fat dairy products, and lean protein. Do not eat a lot of foods that are high in solid fats, added sugars, or sodium. Maintain a healthy weight Body mass index (BMI) is used to identify weight problems. It estimates body fat based on height and weight. Your health care provider can help determine your BMI and help you achieve or maintain a healthy weight. Get regular exercise Get regular exercise. This is one of the most important things you can do for your health. Most adults should: Exercise for at least 150 minutes each week. The exercise should increase your heart rate and make you sweat (moderate-intensity exercise). Do strengthening exercises at least twice a week. This is in addition to the moderate-intensity exercise. Spend less time sitting. Even light physical activity can be beneficial. Watch cholesterol and blood lipids Have your blood tested for lipids and cholesterol at 60 years of age, then have this test every 5 years. Have your cholesterol levels checked more often if: Your lipid or cholesterol levels are high. You are older than 60 years of age. You are at high risk for heart disease. What should I know about cancer screening? Depending on your health history and family history, you may need to have  cancer screening at various ages. This may include screening for: Breast cancer. Cervical cancer. Colorectal cancer. Skin cancer. Lung cancer. What should I know about heart disease, diabetes, and high blood pressure? Blood pressure and heart disease High blood pressure causes heart disease and increases the risk of stroke. This is more likely to develop in people who have high blood pressure readings or are overweight. Have your blood pressure checked: Every 3-5 years if you are 23-75 years of age. Every year if you are 74 years old or older. Diabetes Have regular diabetes screenings. This checks your fasting blood sugar level. Have the screening done: Once every three years after age 77 if you are at a normal weight and have a low risk for diabetes. More often and at a younger age if you are overweight or have a high risk for diabetes. What should I know about preventing infection? Hepatitis B If you have a higher risk for hepatitis B, you should be screened for this virus. Talk with your health care provider to find out if you are at risk for hepatitis B infection. Hepatitis C Testing is recommended for: Everyone born from [birth year] through 1963. Anyone with known risk factors for hepatitis C. Sexually transmitted infections (STIs) Get screened for STIs, including gonorrhea and chlamydia, if: You are sexually active and are younger than 60 years of age. You are older than 60 years of age and your health care provider tells you that you are at risk for this type of infection. Your sexual  activity has changed since you were last screened, and you are at increased risk for chlamydia or gonorrhea. Ask your health care provider if you are at risk. Ask your health care provider about whether you are at high risk for HIV. Your health care provider may recommend a prescription medicine to help prevent HIV infection. If you choose to take medicine to prevent HIV, you should first get tested for HIV.  You should then be tested every 3 months for as long as you are taking the medicine. Pregnancy If you are about to stop having your period (premenopausal) and you may become pregnant, seek counseling before you get pregnant. Take 400 to 800 micrograms (mcg) of folic acid every day if you become pregnant. Ask for birth control (contraception) if you want to prevent pregnancy. Osteoporosis and menopause Osteoporosis is a disease in which the bones lose minerals and strength with aging. This can result in bone fractures. If you are 78 years old or older, or if you are at risk for osteoporosis and fractures, ask your health care provider if you should: Be screened for bone loss. Take a calcium or vitamin D supplement to lower your risk of fractures. Be given hormone replacement therapy (HRT) to treat symptoms of menopause. Follow these instructions at home: Alcohol use Do not drink alcohol if: Your health care provider tells you not to drink. You are pregnant, may be pregnant, or are planning to become pregnant. If you drink alcohol: Limit how much you have to: 0-1 drink a day. Know how much alcohol is in your drink. In the [place] one drink equals one 12 oz bottle of beer (355 mL), one 5 oz glass of wine (148 mL), or one 1 oz glass of hard liquor (44 mL). Lifestyle Do not use any products that contain nicotine or tobacco. These products include cigarettes, chewing tobacco, and vaping devices, such as e-cigarettes. If you need help quitting, ask your health care provider. Do not use street drugs. Do not share needles. Ask your health care provider for help if you need support or information about quitting drugs. General instructions Schedule regular health, dental, and eye exams. Stay current with your vaccines. Tell your health care provider if: You often feel depressed. You have ever been abused or do not feel safe at home. Summary Adopting a healthy lifestyle and getting preventive care  are important in promoting health and wellness. Follow your health care provider's instructions about healthy diet, exercising, and getting tested or screened for diseases. Follow your health care provider's instructions on monitoring your cholesterol and blood pressure. This information is not intended to replace advice given to you by your health care provider. Make sure you discuss any questions you have with your health care provider. Document Revised: 05/04/2021 Document Reviewed: 05/04/2021 Elsevier Patient Education  2023 [employer].

## 2023-05-13 ENCOUNTER — Ambulatory Visit (INDEPENDENT_AMBULATORY_CARE_PROVIDER_SITE_OTHER): Payer: BC Managed Care – PPO | Admitting: Internal Medicine

## 2023-05-13 VITALS — BP 120/80 | HR 60 | Temp 98.0°F | Ht 59.0 in | Wt 130.0 lb

## 2023-05-13 DIAGNOSIS — E538 Deficiency of other specified B group vitamins: Secondary | ICD-10-CM

## 2023-05-13 DIAGNOSIS — R7303 Prediabetes: Secondary | ICD-10-CM

## 2023-05-13 DIAGNOSIS — E038 Other specified hypothyroidism: Secondary | ICD-10-CM | POA: Diagnosis not present

## 2023-05-13 DIAGNOSIS — I10 Essential (primary) hypertension: Secondary | ICD-10-CM

## 2023-05-13 DIAGNOSIS — E782 Mixed hyperlipidemia: Secondary | ICD-10-CM | POA: Diagnosis not present

## 2023-05-13 DIAGNOSIS — Z0001 Encounter for general adult medical examination with abnormal findings: Secondary | ICD-10-CM | POA: Diagnosis not present

## 2023-05-13 DIAGNOSIS — M8588 Other specified disorders of bone density and structure, other site: Secondary | ICD-10-CM

## 2023-05-13 DIAGNOSIS — M5416 Radiculopathy, lumbar region: Secondary | ICD-10-CM | POA: Diagnosis not present

## 2023-05-13 DIAGNOSIS — Z0000 Encounter for general adult medical examination without abnormal findings: Secondary | ICD-10-CM

## 2023-05-13 LAB — CBC WITH DIFFERENTIAL/PLATELET
Basophils Absolute: 0.1 10*3/uL (ref 0.0–0.1)
Basophils Relative: 1.9 % (ref 0.0–3.0)
Eosinophils Absolute: 1.8 10*3/uL — ABNORMAL HIGH (ref 0.0–0.7)
Eosinophils Relative: 25.5 % — ABNORMAL HIGH (ref 0.0–5.0)
HCT: 37 % (ref 36.0–46.0)
Hemoglobin: 12.1 g/dL (ref 12.0–15.0)
Lymphocytes Relative: 22.2 % (ref 12.0–46.0)
Lymphs Abs: 1.5 10*3/uL (ref 0.7–4.0)
MCHC: 32.7 g/dL (ref 30.0–36.0)
MCV: 85.6 fl (ref 78.0–100.0)
Monocytes Absolute: 0.5 10*3/uL (ref 0.1–1.0)
Monocytes Relative: 7.4 % (ref 3.0–12.0)
Neutro Abs: 3 10*3/uL (ref 1.4–7.7)
Neutrophils Relative %: 43 % (ref 43.0–77.0)
Platelets: 321 10*3/uL (ref 150.0–400.0)
RBC: 4.32 Mil/uL (ref 3.87–5.11)
RDW: 13.4 % (ref 11.5–15.5)
WBC: 6.9 10*3/uL (ref 4.0–10.5)

## 2023-05-13 LAB — LIPID PANEL
Cholesterol: 163 mg/dL (ref 0–200)
HDL: 54.9 mg/dL (ref >39.00)
LDL Cholesterol: 89 mg/dL (ref 0–99)
NonHDL: 108.16
Total CHOL/HDL Ratio: 3
Triglycerides: 94 mg/dL (ref 0.0–149.0)
VLDL: 18.8 mg/dL (ref 0.0–40.0)

## 2023-05-13 LAB — TSH: TSH: 2.37 u[IU]/mL (ref 0.35–5.50)

## 2023-05-13 LAB — HEMOGLOBIN A1C: Hgb A1c MFr Bld: 6.1 % (ref 4.6–6.5)

## 2023-05-13 LAB — COMPREHENSIVE METABOLIC PANEL
ALT: 22 U/L (ref 0–35)
AST: 26 U/L (ref 0–37)
Albumin: 4.1 g/dL (ref 3.5–5.2)
Alkaline Phosphatase: 58 U/L (ref 39–117)
BUN: 14 mg/dL (ref 6–23)
CO2: 26 mEq/L (ref 19–32)
Calcium: 9.1 mg/dL (ref 8.4–10.5)
Chloride: 101 mEq/L (ref 96–112)
Creatinine, Ser: 0.59 mg/dL (ref 0.40–1.20)
GFR: 98.3 mL/min (ref >60.00)
Glucose, Bld: 87 mg/dL (ref 70–99)
Potassium: 3.8 mEq/L (ref 3.5–5.1)
Sodium: 133 mEq/L — ABNORMAL LOW (ref 135–145)
Total Bilirubin: 0.5 mg/dL (ref 0.2–1.2)
Total Protein: 8.6 g/dL — ABNORMAL HIGH (ref 6.0–8.3)

## 2023-05-13 LAB — VITAMIN B12: Vitamin B-12: 1107 pg/mL — ABNORMAL HIGH (ref 211–911)

## 2023-05-13 MED ORDER — GABAPENTIN 100 MG PO CAPS: 100.0000 mg | ORAL_CAPSULE | Freq: Every day | ORAL | 3 refills | Status: DC

## 2023-05-13 MED ORDER — OMEPRAZOLE 20 MG PO CPDR: 20.0000 mg | DELAYED_RELEASE_CAPSULE | Freq: Every day | ORAL | 3 refills | Status: AC | PRN

## 2023-05-13 NOTE — Assessment & Plan Note (Signed)
Chronic Regular exercise and healthy diet encouraged Check lipid panel, CMP Continue atorvastatin 10 mg daily 

## 2023-05-13 NOTE — Assessment & Plan Note (Signed)
Chronic DEXA up-to-date Managed by GYN-Dr. Lowe Stressed regular exercise Continue calcium and vitamin D Check vitamin D level 

## 2023-05-13 NOTE — Assessment & Plan Note (Signed)
Chronic Check a1c Low sugar / carb diet Stressed regular exercise  

## 2023-05-13 NOTE — Assessment & Plan Note (Signed)
Chronic  Clinically euthyroid Check tsh and will titrate med dose if needed Currently taking levothyroxine 50 mcg 6 days a week 

## 2023-05-13 NOTE — Assessment & Plan Note (Signed)
Chronic Blood pressure controlled  CMP Continue Benicar 5 mg daily

## 2023-05-13 NOTE — Assessment & Plan Note (Signed)
Chronic Taking vitamin D daily Check B12 level 

## 2023-05-13 NOTE — Assessment & Plan Note (Signed)
Subacute Has been experiencing lower back pain with pain down both legs and cramping in her legs Did see Dr. Roda Shutters about this, but at that time she was having more hip pain Did not tolerate diclofenac-caused stomach upset Tizanidine was too sedating Will try gabapentin 100-200 mg at night-discussed that possible side effects of dizziness and drowsiness She is doing exercises-stressed doing these on a regular basis Discussed that if she is not seeing improvement in her symptoms she needs to go back to see Dr. Roda Shutters

## 2023-05-19 ENCOUNTER — Other Ambulatory Visit: Payer: Self-pay | Admitting: Internal Medicine

## 2023-06-28 ENCOUNTER — Other Ambulatory Visit: Payer: Self-pay

## 2023-06-28 ENCOUNTER — Telehealth: Payer: Self-pay | Admitting: Internal Medicine

## 2023-06-28 MED ORDER — LEVOTHYROXINE SODIUM 50 MCG PO TABS: ORAL_TABLET | ORAL | 3 refills | Status: DC

## 2023-06-28 NOTE — Telephone Encounter (Signed)
Script faxed in today. 

## 2023-06-28 NOTE — Telephone Encounter (Signed)
Prescription Request  06/28/2023  LOV: 05/13/2023  What is the name of the medication or equipment? levothyroxine (SYNTHROID) 50 MCG tablet  Have you contacted your pharmacy to request a refill? Yes   Which pharmacy would you like this sent to?   [facility] [address] ([address] [place] - [address] [place]) [place] [address] Phone: [phone] Fax: [phone]     Patient notified that their request is being sent to the clinical staff for review and that they should receive a response within 2 business days.   Please advise at Mobile [phone] (mobile)

## 2023-07-11 ENCOUNTER — Other Ambulatory Visit: Payer: Self-pay | Admitting: Internal Medicine

## 2023-07-20 ENCOUNTER — Other Ambulatory Visit: Payer: Self-pay

## 2023-07-20 ENCOUNTER — Ambulatory Visit: Payer: BC Managed Care – PPO | Admitting: Family Medicine

## 2023-07-20 VITALS — BP 122/76 | HR 61 | Ht 59.0 in | Wt 128.0 lb

## 2023-07-20 DIAGNOSIS — M7521 Bicipital tendinitis, right shoulder: Secondary | ICD-10-CM | POA: Diagnosis not present

## 2023-07-20 DIAGNOSIS — G5611 Other lesions of median nerve, right upper limb: Secondary | ICD-10-CM | POA: Diagnosis not present

## 2023-07-20 MED ORDER — PREDNISONE 50 MG PO TABS
50.0000 mg | ORAL_TABLET | Freq: Every day | ORAL | 0 refills | Status: DC
Start: 2023-07-20 — End: 2023-11-13

## 2023-07-20 NOTE — Progress Notes (Signed)
   Rubin Payor, PhD, LAT, ATC acting as a scribe for Clementeen Graham, MD.  Maria Barker is a 62 y.o. female who presents to [facility] at [facility] today for exacerbation of her R elbow pain thought to be due to distal biceps tendinitis. Pt was last seen by Dr. Denyse Amass on 12/10/22 and pain was improving. She was advised to cont HEP, Voltaren gel, Tylenol, and was prescribed Celebrex.  Today, pt reports R elbow pain worsened last week. She was just doing her usual work routine. Pt locates pain to the R cubital fossa and into the anterior aspect of her proximal forward. Pain will radiating pain into R wrist and hand.  Radiates: yes Paresthesia: no Grip strength: normal Aggravates: pronation/supination Treatments tried: Voltaren gel, biofreeze, oral diclofenac, gabapentin  Dx imaging: 11/08/22 R elbow XR  Pertinent review of systems: No fevers or chills  Relevant historical information: Hypertension Carpal tunnel syndrome on nerve conduction study 2022  Exam:  BP 122/76   Pulse 61   Ht 4\' 11"  (1.499 m)   Wt 128 lb (58.1 kg)   SpO2 98%   BMI 25.85 kg/m  General: Well Developed, well nourished, and in no acute distress.   MSK: Right elbow normal appearing Tender palpation anterior elbow at distal biceps tendon and pronator mass. Pain is present with resisted elbow flexion.  Patient has radiating pain into her forearm distally with resisted pronation. Negative Tinel's at carpal tunnel. Grip strength and distal hand and wrist strength are intact. Pulses cap refill and sensation are intact.     Assessment and Plan: 62 y.o. female with right elbow pain.  This is an acute exacerbation of a chronic problem.  Previously she has been thought to have distal biceps tendinitis and carpal tunnel syndrome.  Today's symptoms are more typical for distal biceps tendinitis and pronator syndrome.  She did have a blood drawl 2 months ago that may have precipitated this.  She is a  good candidate for occupational therapy trial.  Will do that and short course of prednisone which could be helpful.  I like to avoid injecting around this area if possible.   PDMP not reviewed this encounter. Orders Placed This Encounter  Procedures   Ambulatory referral to Occupational Therapy    Referral Priority:   Routine    Referral Type:   Occupational Therapy    Referral Reason:   Specialty Services Required    Requested Specialty:   Occupational Therapy    Number of Visits Requested:   1   Meds ordered this encounter  Medications   predniSONE (DELTASONE) 50 MG tablet    Sig: Take 1 tablet (50 mg total) by mouth daily.    Dispense:  5 tablet    Refill:  0     Discussed warning signs or symptoms. Please see discharge instructions. Patient expresses understanding.   The above documentation has been reviewed and is accurate and complete Clementeen Graham, M.D.

## 2023-07-20 NOTE — Patient Instructions (Addendum)
Thank you for coming in today.   I've referred you to Physical Therapy.  Let us know if you don't hear from them in one week.   Take prednisone for 5 days.   Use gabapentin as needed for nerve pain mostly at night.   Use a carpal tunnel wrist brace.   Pronator Syndrome  The median nerve is a nerve in the forearm that enables feeling and muscle function in certain parts of the hand. Pronator syndrome is a condition that happens when the median nerve has pressure (compression) placed on it by a muscle or other structure on the inner side of the forearm near the elbow. The condition can cause weakness or tingling in the thumb, index, middle, or ring fingers. It can also cause a dull ache or pain in the forearm. What are the causes? This condition may be caused by: Overuse or repeated movements that increase the size of a muscle in your forearm (pronator teres). Trauma or a hard, direct hit to the forearm, resulting in swelling (hematoma). A problem that is present at birth (congenital defect). What increases the risk? This condition is more likely to develop in: Weight lifters. People who play sports that require forearm rotation, such as baseball, tennis, or golf. People who have a job that requires grasping objects and twisting the forearm, such as carpentry. Adults who are 73-106 years old. Females. What are the signs or symptoms? Symptoms of this condition include: An ache or pain in the palm side of your forearm, close to your elbow. A tingling or prickly sensation (paresthesia) in your thumb, index, and middle fingers and in half of your ring finger. Weakness in your hand, especially when pinching your index finger and thumb together. Symptoms that get worse with repeated movement, gripping, or forearm rotation. How is this diagnosed? This condition may be diagnosed based on: Your medical history. A physical exam. You may be asked to move your hand, fingers, wrist, and arm in  certain ways. This will help your health care provider find the source of your pain. Tests and imaging studies, including: An electromyogram. This test can show how well the median nerve is working and if there is too much pressure on it or a nearby nerve. A nerve conduction study. This test measures how well electrical signals pass through your nerves. X-ray. This may be done to check for an underlying bone problem. How is this treated? Treatment for this condition may include: Resting the injured area. Taking NSAIDs, such as ibuprofen, or steroid medicine to help with pain and swelling. Wearing a splint or brace for support until your symptoms improve. Doing exercises to help you improve movement in your hand and arm (physical therapy). Therapy to help with everyday activities (occupational therapy). If treatments do not help, you may need: An injection of an anti-inflammatory medicine (steroid) mixed with a numbing medicine (local anesthesia). Surgery. This is usually done only if other treatments fail and symptoms continue for more than 6 months. Follow these instructions at home: If you have a removable splint or brace: Wear the splint or brace as told by your provider. Remove it only as told by your provider. Check the skin around it every day. Tell your provider about any concerns. Loosen it if your fingers tingle, become numb, or turn cold and blue. Keep it clean. If the splint or brace is not waterproof: Do not let it get wet. Cover it with a watertight covering when you take a bath or  shower. Managing pain, stiffness, and swelling  Take over-the-counter and prescription medicines only as told by your provider. Move your fingers often to reduce stiffness and swelling. Raise (elevate) the injured area above the level of your heart while you are sitting or lying down. If told, put ice on the injured area. If you have a removable splint or brace, remove it as told by your  provider. Put ice in a plastic bag. Place a towel between your skin and the bag. Leave the ice on for 20 minutes, 2-3 times a day. If your skin turns bright red, remove the ice right away to prevent skin damage. The risk of damage is higher if you cannot feel pain, heat, or cold. Activity Avoid or limit activities that cause your symptoms to get worse or flare up. Do exercises as told by your provider. Return to your normal activities as told by your provider. Ask your provider what activities are safe for you. You may have to avoid lifting. Ask your provider how much you can safely lift. General instructions Do not use any products that contain nicotine or tobacco. These products include cigarettes, chewing tobacco, and vaping devices, such as e-cigarettes. These can delay healing. If you need help quitting, ask your provider. Keep all follow-up visits. Your provider will monitor your healing and adjust your activities. How is this prevented? Warm up and stretch before being active. Cool down and stretch after being active. Give your body time to rest between periods of activity. Maintain physical fitness, including strength and flexibility. Contact a health care provider if: Your symptoms do not improve in 4-6 weeks. Your symptoms get worse. Your splint or brace is causing pain, numbness, or tingling that is new. Get help right away if: Your pain is severe. You cannot move part of your hand or arm. This information is not intended to replace advice given to you by your health care provider. Make sure you discuss any questions you have with your health care provider. Document Revised: 07/20/2022 Document Reviewed: 07/20/2022 Elsevier Patient Education  2024 [employer].

## 2023-07-29 NOTE — Therapy (Signed)
OUTPATIENT OCCUPATIONAL THERAPY ORTHO EVALUATION  Patient Name: Maria Barker MRN: [id number] DOB:19-Jan-1961, 62 y.o., female Today's Date: 08/02/2023  PCP: Cheryll Cockayne, MD REFERRING PROVIDER:  Rodolph Bong, MD    END OF SESSION:  OT End of Session - 08/02/23 1025     Visit Number 1    Number of Visits 8    Date for OT Re-Evaluation 09/16/23    Authorization Type BCBS    OT Start Time 1025    OT Stop Time 1115    OT Time Calculation (min) 50 min    Activity Tolerance Patient tolerated treatment well;Patient limited by pain;Patient limited by fatigue    Behavior During Therapy [facility] for tasks assessed/performed             Past Medical History:  Diagnosis Date   Arthritis    R THR 2011   Back pain    Frequent headaches    Hyperlipidemia LDL goal < 130    Hypertension    Hypothyroid 02/06/2015   Dx 01/2015: TSH 5.4   Insomnia    Left hip pain    Pain of left thigh    Past Surgical History:  Procedure Laterality Date   TOTAL HIP ARTHROPLASTY Right 03/31/10   olin   TOTAL HIP ARTHROPLASTY Left 03/08/2016   Procedure: TOTAL HIP ARTHROPLASTY;  Surgeon: Gean Birchwood, MD;  Location: [facility] OR;  Service: Orthopedics;  Laterality: Left;   TUBAL LIGATION     UPPER GASTROINTESTINAL ENDOSCOPY     Patient Active Problem List   Diagnosis Date Noted   Lumbar radiculopathy 05/13/2023   Pain in right hip 04/05/2023   Biceps tendonitis on right 11/08/2022   Allergic rhinitis 12/21/2021   Fatigue 12/21/2021   Lateral epicondylitis of left elbow 10/02/2021   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   Pain of both elbows 05/11/2021   Left leg pain 05/11/2021   Osteopenia - Dr Rana Snare 11/27/2020   Left hip pain 05/08/2020   Rash on lips 01/08/2019   Essential hypertension 01/08/2019   De Quervain's tenosynovitis, right 03/09/2018   Prediabetes 09/17/2017   Osteoarthritis of left knee 04/12/2017   B12 deficiency 08/13/2016   S/P bilateral hip replacements 08/13/2016    Family history of brain aneurysm 08/12/2015   Gastroesophageal reflux disease without esophagitis 08/12/2015   Hypothyroid 02/06/2015   Hyperlipidemia    Chronic insomnia 04/13/2013    ONSET DATE: 1-2 months onset  REFERRING DIAG:  M75.21 (ICD-10-CM) - Biceps tendonitis on right  G56.11 (ICD-10-CM) - Pronator syndrome of right upper extremity    THERAPY DIAG:  Pain in right forearm - Plan: Ot plan of care cert/re-cert  Biceps tendonitis on right - Plan: Ot plan of care cert/re-cert  Pronator syndrome of right upper extremity - Plan: Ot plan of care cert/re-cert  Muscle weakness (generalized) - Plan: Ot plan of care cert/re-cert  Pain in right elbow - Plan: Ot plan of care cert/re-cert  Stiffness of right shoulder, not elsewhere classified - Plan: Ot plan of care cert/re-cert  Rationale for Evaluation and Treatment: Rehabilitation  SUBJECTIVE:   SUBJECTIVE STATEMENT: She states after coming back from vacation in July, she noticed a pain in antecubital region of her Rt elbow, now feels like sharp pain like a needle poking her, pain running down lateral arm to dorsum of wrist and into fingers at times. Washing hair, bending elbow makes pain worse with tasks and sleeping.  She states gabapentin did help but it caused some shaking in  her hands after 5th day, so she stopped it. She states having bad experience with dry needling before.  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 er former role was repetitive motion.    PERTINENT HISTORY: Per MD notes: "exacerbation of her R elbow pain thought to be due to distal biceps tendinitis. Pt was last seen by Dr. Denyse Amass on 12/10/22 and pain was improving. She was advised to cont HEP, Voltaren gel, Tylenol, and was prescribed Celebrex.Marland KitchenMarland KitchenToday, pt reports R elbow pain worsened last week. She was just doing her usual work routine. Pt locates pain to the R cubital fossa and into the anterior  aspect of her proximal forward. Pain will radiating pain into R wrist and hand. Today's symptoms are more typical for distal biceps tendinitis and pronator syndrome. "   PRECAUTIONS: None  RED FLAGS: None   WEIGHT BEARING RESTRICTIONS: No  PAIN:  Are you having pain? Yes: NPRS scale: at rest now: 7/10, in past week at worst up to 9/10 Pain location: antecubital region Pain description: stabbing and radiating Aggravating factors: bending elbow Relieving factors: rest/heat  FALLS: Has patient fallen in last 6 months? No  LIVING ENVIRONMENT: Lives with: lives with their partner (BF) and cat  Has following equipment at home: None  PLOF: Independent  PATIENT GOALS: To decrease pain in right arm and get back to full function   OBJECTIVE: (All objective assessments below are from initial evaluation on: 08/02/23 unless otherwise specified.)   HAND DOMINANCE: Right   ADLs: Overall ADLs: States decreased ability to grab, hold household objects, pain and inability to open containers, perform FMS tasks (manipulate fasteners on clothing), mild to moderate bathing problems as well.    FUNCTIONAL OUTCOME MEASURES: Eval: Quck DASH 52% impairment today  (Higher % Score  =  More Impairment)     UPPER EXTREMITY ROM     Shoulder to Wrist AROM Right eval Left eval  Shoulder flexion 158   Shoulder abduction    Shoulder extension    Shoulder internal rotation 10 25  Shoulder external rotation 70 85  Elbow flexion 141 144  Elbow extension (-22) (-2)  Forearm supination 72 88  Forearm pronation  75 63  Wrist flexion 74 70  Wrist extension 36 37  (Blank rows = not tested)   Hand AROM Right eval Left eval  Full Fist Ability (or Gap to Distal Palmar Crease)    Thumb Opposition  (Kapandji Scale)     Thumb MCP (0-60)    Thumb IP (0-80)    Thumb Radial Abduction Span     Thumb Palmar Abduction Span     Index MCP (0-90)     Index PIP (0-100)     Index DIP (0-70)      Long MCP  (0-90)      Long PIP (0-100)      Long DIP (0-70)      Ring MCP (0-90)      Ring PIP (0-100)      Ring DIP (0-70)      Little MCP (0-90)      Little PIP (0-100)      Little DIP (0-70)      (Blank rows = not tested)   UPPER EXTREMITY MMT:     MMT Right TBD  Shoulder flexion   Shoulder abduction   Shoulder adduction   Shoulder extension   Shoulder internal rotation   Shoulder external  rotation   Middle trapezius   Lower trapezius   Elbow flexion 4/5 tender  Elbow extension 4/5 tender  Forearm supination 4+/5 tender  Forearm pronation 4+/5 no pain with bent elbow  4-/5 pain with straight elbow  Wrist flexion 4+/5 tender in wrist  Wrist extension 4+/5 tender in wrist  Wrist ulnar deviation   Wrist radial deviation   (Blank rows = not tested)  HAND FUNCTION: Eval: Observed weakness in affected hand.  Grip strength Right: 37 lbs no pain, Left: 40 lbs   COORDINATION: Eval: No observed coordination impairments with affected hand.   SENSATION: Eval:  Light touch intact today  EDEMA:   Eval: no swelling  COGNITION: Eval: Overall cognitive status: WFL for evaluation today   OBSERVATIONS:   Eval: neg tinel's at pronator, neg scratch collapse at carpal tunnel, elbow, pronator, etc.  Seems to be no nerve involvement or complaints at this time (at least after taking gabapentin).   Has large, painful muscle spasms to biceps, lateral shoulder and upper trap/lev scap. Some TTP to pronator/flexor group    TODAY'S TREATMENT:  Post-evaluation treatment:   She was given self-care education to not cause herself pain during daily routines and with stretches that would be assigned.  OT educated that this is likely an overuse syndrome or strain to the right arm with which she had had multiple problems in the past.  OT then assigns home exercise program as listed below, demonstrates each 1 to her and has her perform back with no significant pains.  Exercises - Seated Scapular  Retraction  - 4 x daily - 5-10 reps - Sleeper Stretch  - 3-4 x daily - 3-5 reps - 15 hold - Forearm Supination Stretch  - 3-4 x daily - 3-5 reps - 15 sec hold - Forearm Pronation Stretch  - 3-4 x daily - 3-5 reps - 15 sec hold - Wrist Prayer Stretch  - 4 x daily - 3-5 reps - 15 sec hold - Doorway Stretches (both arms low, lean in gently)   - 3-4 x daily - 3-5 reps - 15 hold    PATIENT EDUCATION: Education details: See tx section above for details  Person educated: Patient Education method: Verbal Instruction, Teach back, Handouts  Education comprehension: States and demonstrates understanding, Additional Education required    HOME EXERCISE PROGRAM: Access Code: [email or web address] URL: [email or web address] Date: 08/02/2023 Prepared by: Fannie Knee   GOALS: Goals reviewed with patient? Yes   SHORT TERM GOALS: (STG required if POC>30 days) Target Date: 08/19/23  Pt will demo/state understanding of initial HEP to improve pain levels and prerequisite motion. Goal status: INITIAL   LONG TERM GOALS: Target Date: 09/16/23  Pt will improve functional ability by decreased impairment per Quick DASH assessment from 52% to 20% or better, for better quality of life. Goal status: INITIAL  2.  Pt will improve grip strength in Rt hand from 37lbs to at least 42lbs for functional use at home and in IADLs. Goal status: INITIAL  3.  Pt will improve A/ROM in Rt sh IR from 10* to at least 35*, to have functional motion for tasks like reach and grasp.  Goal status: INITIAL  4.  Pt will improve strength in Rt FA pronation (w/elbow extended) from 4-/5 painful MMT to at least 4+/5 MMT to have increased functional ability to carry out selfcare and higher-level homecare tasks with no difficulty. Goal status: INITIAL  5.  Pt will improve A/ROM in Rt elbow ext from (-22) to  at least (-6), to have functional motion for tasks like reach and grasp.  Goal status: INITIAL  6.  Pt will decrease  pain at rest from 7/10 to 3/10 or better to have better sleep and occupational participation in daily roles. Goal status: INITIAL   ASSESSMENT:  CLINICAL IMPRESSION: Patient is a 62 y.o. female who was seen today for occupational therapy evaluation for painful and stiff right shoulder and elbow causing radiating pain down the forearm to the hand, also decreased functional ability with I/ADLs.  This seems to be "leftover" from chronic/old injuries mixed with former job duties of repetitive motion, and OT is also suspicious that she may have strained it during her recent trip to [place] with luggage or other carrying.  She will benefit from outpatient occupational therapy to decrease symptoms and increase quality of life.   PERFORMANCE DEFICITS: in functional skills including ADLs, IADLs, coordination, ROM, strength, pain, fascial restrictions, muscle spasms, flexibility, Gross motor control, body mechanics, endurance, decreased knowledge of precautions, and UE functional use, cognitive skills including problem solving and safety awareness, and psychosocial skills including coping strategies, environmental adaptation, and habits.   IMPAIRMENTS: are limiting patient from ADLs, IADLs, rest and sleep, work, and leisure.   COMORBIDITIES: may have co-morbidities  that affects occupational performance. Patient will benefit from skilled OT to address above impairments and improve overall function.  MODIFICATION OR ASSISTANCE TO COMPLETE EVALUATION: No modification of tasks or assist necessary to complete an evaluation.  OT OCCUPATIONAL PROFILE AND HISTORY: Detailed assessment: Review of records and additional review of physical, cognitive, psychosocial history related to current functional performance.  CLINICAL DECISION MAKING: Moderate - several treatment options, min-mod task modification necessary  REHAB POTENTIAL: Good  EVALUATION COMPLEXITY: Low      PLAN:  OT FREQUENCY: 1-2x/week  OT  DURATION: 6 weeks (through 09/16/23 as needed- up to 8 total visits)   PLANNED INTERVENTIONS: self care/ADL training, therapeutic exercise, therapeutic activity, neuromuscular re-education, manual therapy, splinting, electrical stimulation, ultrasound, fluidotherapy, compression bandaging, moist heat, cryotherapy, contrast bath, patient/family education, energy conservation, coping strategies training, DME and/or AE instructions, Re-evaluation, and Dry needling  RECOMMENDED OTHER SERVICES: none now   CONSULTED AND AGREED WITH PLAN OF CARE: Patient  PLAN FOR NEXT SESSION:   Check on pain and symptoms, perform manual therapy as tolerated and needed for muscle spasms and stiffness, check home exercise program.   Fannie Knee, OTR/L 08/02/2023, 11:44 AM

## 2023-08-02 ENCOUNTER — Encounter: Payer: Self-pay | Admitting: Rehabilitative and Restorative Service Providers"

## 2023-08-02 ENCOUNTER — Encounter: Payer: BC Managed Care – PPO | Admitting: Rehabilitative and Restorative Service Providers"

## 2023-08-02 ENCOUNTER — Other Ambulatory Visit: Payer: Self-pay

## 2023-08-02 ENCOUNTER — Ambulatory Visit: Payer: BC Managed Care – PPO | Admitting: Rehabilitative and Restorative Service Providers"

## 2023-08-02 DIAGNOSIS — M25521 Pain in right elbow: Secondary | ICD-10-CM

## 2023-08-02 DIAGNOSIS — M7521 Bicipital tendinitis, right shoulder: Secondary | ICD-10-CM | POA: Diagnosis not present

## 2023-08-02 DIAGNOSIS — M79631 Pain in right forearm: Secondary | ICD-10-CM

## 2023-08-02 DIAGNOSIS — G5611 Other lesions of median nerve, right upper limb: Secondary | ICD-10-CM

## 2023-08-02 DIAGNOSIS — M25611 Stiffness of right shoulder, not elsewhere classified: Secondary | ICD-10-CM

## 2023-08-02 DIAGNOSIS — M6281 Muscle weakness (generalized): Secondary | ICD-10-CM

## 2023-08-11 ENCOUNTER — Ambulatory Visit: Payer: BC Managed Care – PPO | Admitting: Occupational Therapy

## 2023-08-11 ENCOUNTER — Encounter: Payer: Self-pay | Admitting: Occupational Therapy

## 2023-08-11 DIAGNOSIS — G5611 Other lesions of median nerve, right upper limb: Secondary | ICD-10-CM | POA: Diagnosis not present

## 2023-08-11 DIAGNOSIS — M25611 Stiffness of right shoulder, not elsewhere classified: Secondary | ICD-10-CM

## 2023-08-11 DIAGNOSIS — M79631 Pain in right forearm: Secondary | ICD-10-CM

## 2023-08-11 DIAGNOSIS — M25521 Pain in right elbow: Secondary | ICD-10-CM

## 2023-08-11 DIAGNOSIS — M6281 Muscle weakness (generalized): Secondary | ICD-10-CM | POA: Diagnosis not present

## 2023-08-11 DIAGNOSIS — M7521 Bicipital tendinitis, right shoulder: Secondary | ICD-10-CM

## 2023-08-11 NOTE — Therapy (Signed)
OUTPATIENT OCCUPATIONAL THERAPY ORTHO TREATMENT  Patient Name: Maria Barker MRN: [id number] DOB:1961-12-13, 62 y.o., female Today's Date: 08/11/2023  PCP: Cheryll Cockayne, MD REFERRING PROVIDER:  Rodolph Bong, MD    END OF SESSION:  OT End of Session - 08/11/23 1013     Visit Number 2    Number of Visits 8    Date for OT Re-Evaluation 09/16/23    Authorization Type BC[employer]    OT Start Time 1013    OT Stop Time 1054    OT Time Calculation (min) 41 min    Activity Tolerance Patient tolerated treatment well;Patient limited by pain;Patient limited by fatigue    Behavior During Therapy [facility] for tasks assessed/performed              Past Medical History:  Diagnosis Date   Arthritis    R THR 2011   Back pain    Frequent headaches    Hyperlipidemia LDL goal < 130    Hypertension    Hypothyroid 02/06/2015   Dx 01/2015: TSH 5.4   Insomnia    Left hip pain    Pain of left thigh    Past Surgical History:  Procedure Laterality Date   TOTAL HIP ARTHROPLASTY Right 03/31/10   olin   TOTAL HIP ARTHROPLASTY Left 03/08/2016   Procedure: TOTAL HIP ARTHROPLASTY;  Surgeon: Gean Birchwood, MD;  Location: [facility] OR;  Service: Orthopedics;  Laterality: Left;   TUBAL LIGATION     UPPER GASTROINTESTINAL ENDOSCOPY     Patient Active Problem List   Diagnosis Date Noted   Lumbar radiculopathy 05/13/2023   Pain in right hip 04/05/2023   Biceps tendonitis on right 11/08/2022   Allergic rhinitis 12/21/2021   Fatigue 12/21/2021   Lateral epicondylitis of left elbow 10/02/2021   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   Pain of both elbows 05/11/2021   Left leg pain 05/11/2021   Osteopenia - Dr Rana Snare 11/27/2020   Left hip pain 05/08/2020   Rash on lips 01/08/2019   Essential hypertension 01/08/2019   De Quervain's tenosynovitis, right 03/09/2018   Prediabetes 09/17/2017   Osteoarthritis of left knee 04/12/2017   B12 deficiency 08/13/2016   S/P bilateral hip replacements 08/13/2016    Family history of brain aneurysm 08/12/2015   Gastroesophageal reflux disease without esophagitis 08/12/2015   Hypothyroid 02/06/2015   Hyperlipidemia    Chronic insomnia 04/13/2013    ONSET DATE: 1-2 months onset  REFERRING DIAG:  M75.21 (ICD-10-CM) - Biceps tendonitis on right  G56.11 (ICD-10-CM) - Pronator syndrome of right upper extremity    THERAPY DIAG:  Biceps tendonitis on right  Pronator syndrome of right upper extremity  Muscle weakness (generalized)  Pain in right elbow  Stiffness of right shoulder, not elsewhere classified  Pain in right forearm  Rationale for Evaluation and Treatment: Rehabilitation  SUBJECTIVE:   SUBJECTIVE STATEMENT: Pt reports that she has tried to do her exercises but "It did not help at all, it was really bad" MW:NUUV in right UE. Pt reports more pain in her anticubital region of her Rt elbow in the morning rating pain at rest as 7/10 and worst as 9/10. She tends to sleep on her right side with her arm fully extended over the edge of the bed.   PERTINENT HISTORY: Per MD notes: "exacerbation of her R elbow pain thought to be due to distal biceps tendinitis. Pt was last seen by Dr. Denyse Amass on 12/10/22 and pain was improving. She was advised to cont  HEP, Voltaren gel, Tylenol, and was prescribed Celebrex.Marland KitchenMarland KitchenToday, pt reports R elbow pain worsened last week. She was just doing her usual work routine. Pt locates pain to the R cubital fossa and into the anterior aspect of her proximal forward. Pain will radiating pain into R wrist and hand. Today's symptoms are more typical for distal biceps tendinitis and pronator syndrome. "   PRECAUTIONS: None  RED FLAGS: None   WEIGHT BEARING RESTRICTIONS: No  PAIN:  Are you having pain? Yes: NPRS scale: at rest now: 7/10, in past week at worst up to 9/10 Pain location: antecubital region Pain description: stabbing and radiating Aggravating factors: bending elbow Relieving factors: rest/heat  FALLS: Has  patient fallen in last 6 months? No  LIVING ENVIRONMENT: Lives with: lives with their partner (BF) and cat  Has following equipment at home: None  PLOF: Independent  PATIENT GOALS: To decrease pain in right arm and get back to full function   OBJECTIVE: (All objective assessments below are from initial evaluation on: 08/02/23 unless otherwise specified.)   HAND DOMINANCE: Right   ADLs: Overall ADLs: States decreased ability to grab, hold household objects, pain and inability to open containers, perform FMS tasks (manipulate fasteners on clothing), mild to moderate bathing problems as well.    FUNCTIONAL OUTCOME MEASURES: Eval: Quck DASH 52% impairment today  (Higher % Score  =  More Impairment)     UPPER EXTREMITY ROM     Shoulder to Wrist AROM Right eval Left eval  Shoulder flexion 158   Shoulder abduction    Shoulder extension    Shoulder internal rotation 10 25  Shoulder external rotation 70 85  Elbow flexion 141 144  Elbow extension (-22) (-2)  Forearm supination 72 88  Forearm pronation  75 63  Wrist flexion 74 70  Wrist extension 36 37  (Blank rows = not tested)   Hand AROM Right eval Left eval  Full Fist Ability (or Gap to Distal Palmar Crease)    Thumb Opposition  (Kapandji Scale)     Thumb MCP (0-60)    Thumb IP (0-80)    Thumb Radial Abduction Span     Thumb Palmar Abduction Span     Index MCP (0-90)     Index PIP (0-100)     Index DIP (0-70)      Long MCP (0-90)      Long PIP (0-100)      Long DIP (0-70)      Ring MCP (0-90)      Ring PIP (0-100)      Ring DIP (0-70)      Little MCP (0-90)      Little PIP (0-100)      Little DIP (0-70)      (Blank rows = not tested)   UPPER EXTREMITY MMT:     MMT Right TBD  Shoulder flexion   Shoulder abduction   Shoulder adduction   Shoulder extension   Shoulder internal rotation   Shoulder external rotation   Middle trapezius   Lower trapezius   Elbow flexion 4/5 tender  Elbow extension 4/5  tender  Forearm supination 4+/5 tender  Forearm pronation 4+/5 no pain with bent elbow  4-/5 pain with straight elbow  Wrist flexion 4+/5 tender in wrist  Wrist extension 4+/5 tender in wrist  Wrist ulnar deviation   Wrist radial deviation   (Blank rows = not tested)  HAND FUNCTION: Eval: Observed weakness in affected hand.  Grip strength Right: 37 lbs  no pain, Left: 40 lbs   COORDINATION: Eval: No observed coordination impairments with affected hand.   SENSATION: Eval:  Light touch intact today  EDEMA:   Eval: no swelling  COGNITION: Eval: Overall cognitive status: WFL for evaluation today   OBSERVATIONS:   Eval: neg tinel's at pronator, neg scratch collapse at carpal tunnel, elbow, pronator, etc.  Seems to be no nerve involvement or complaints at this time (at least after taking gabapentin).   Has large, painful muscle spasms to biceps, lateral shoulder and upper trap/lev scap. Some TTP to pronator/flexor group    TODAY'S TREATMENT:  08/11/23: Reviewed and performed HEP as outlined during initial evaluation (see medbridge HEP below for details). Pt required min verbal cues to hold stretches and positioning noted, especially during sleeper stretch, where pt felt more of a stretch in her posterior shoulder when her shoulder/elbow were in proper position. Each exercise was performed x10 reps.  Pt reports pain with forearm pronation and supination stretches noted. Introduced forearm stretch with forearm supinated, elbow flexed then extended with combined passive wrist extension using left hand. Discussed positioning during sleep using pillows between knees when laying on her right side due to c/o knee pain, as well as using pillow under right wrist/forearm. Manual therapy and stretch/cross friction massage x10 min to  distal biceps and pronator/flexor group of right forearm. Discussed importance of stretch breaks at home after ADL's/IADL's and at work to assist with decreasing pain. Pt  verbalized understanding in the clinic.  08/02/23: Post-evaluation treatment:   She was given self-care education to not cause herself pain during daily routines and with stretches that would be assigned.  OT educated that this is likely an overuse syndrome or strain to the right arm with which she had had multiple problems in the past.  OT then assigns home exercise program as listed below, demonstrates each 1 to her and has her perform back with no significant pains.  Exercises - Seated Scapular Retraction  - 4 x daily - 5-10 reps - Sleeper Stretch  - 3-4 x daily - 3-5 reps - 15 hold - Forearm Supination Stretch  - 3-4 x daily - 3-5 reps - 15 sec hold - Forearm Pronation Stretch  - 3-4 x daily - 3-5 reps - 15 sec hold - Wrist Prayer Stretch  - 4 x daily - 3-5 reps - 15 sec hold - Doorway Stretches (both arms low, lean in gently)   - 3-4 x daily - 3-5 reps - 15 hold    PATIENT EDUCATION: Education details: See tx section above for details  Person educated: Patient Education method: Verbal Instruction, Teach back, Handouts  Education comprehension: States and demonstrates understanding, Additional Education required    HOME EXERCISE PROGRAM: Access Code: [email or web address] URL: [email or web address] Date: 08/02/2023 Prepared by: Fannie Knee   GOALS: Goals reviewed with patient? Yes   SHORT TERM GOALS: (STG required if POC>30 days) Target Date: 08/19/23  Pt will demo/state understanding of initial HEP to improve pain levels and prerequisite motion. Goal status: INITIAL   LONG TERM GOALS: Target Date: 09/16/23  Pt will improve functional ability by decreased impairment per Quick DASH assessment from 52% to 20% or better, for better quality of life. Goal status: INITIAL  2.  Pt will improve grip strength in Rt hand from 37lbs to at least 42lbs for functional use at home and in IADLs. Goal status: INITIAL  3.  Pt will improve A/ROM in Rt sh IR from 10* to at least  35*, to have functional motion for tasks like reach and grasp.  Goal status: INITIAL  4.  Pt will improve strength in Rt FA pronation (w/elbow extended) from 4-/5 painful MMT to at least 4+/5 MMT to have increased functional ability to carry out selfcare and higher-level homecare tasks with no difficulty. Goal status: INITIAL  5.  Pt will improve A/ROM in Rt elbow ext from (-22) to at least (-6), to have functional motion for tasks like reach and grasp.  Goal status: INITIAL  6.  Pt will decrease pain at rest from 7/10 to 3/10 or better to have better sleep and occupational participation in daily roles. Goal status: INITIAL   ASSESSMENT:  CLINICAL IMPRESSION: Pt should benefit from consistent performance of HEP as well as stretch breaks at hoe and during the day while at work. Pt was educated in importance of proper positioning during exercises, especially sleeper stretch for consistency. Pt rated her pain as tat the start of therapy as the same as at her initial eval, but she did express dome relief after manual therapy/trigger point massage today. As stated in her initial eval, her symptoms appear to be "leftover" from chronic/old injuries mixed with former and current job duties of repetitive motion. Evaluating OT was also suspicious that she may have strained it during her recent trip to [place] with luggage or other carrying.   PERFORMANCE DEFICITS: in functional skills including ADLs, IADLs, coordination, ROM, strength, pain, fascial restrictions, muscle spasms, flexibility, Gross motor control, body mechanics, endurance, decreased knowledge of precautions, and UE functional use, cognitive skills including problem solving and safety awareness, and psychosocial skills including coping strategies, environmental adaptation, and habits.   IMPAIRMENTS: are limiting patient from ADLs, IADLs, rest and sleep, work, and leisure.   COMORBIDITIES: may have co-morbidities  that affects occupational  performance. Patient will benefit from skilled OT to address above impairments and improve overall function.  MODIFICATION OR ASSISTANCE TO COMPLETE EVALUATION: No modification of tasks or assist necessary to complete an evaluation.  OT OCCUPATIONAL PROFILE AND HISTORY: Detailed assessment: Review of records and additional review of physical, cognitive, psychosocial history related to current functional performance.  CLINICAL DECISION MAKING: Moderate - several treatment options, min-mod task modification necessary  REHAB POTENTIAL: Good  EVALUATION COMPLEXITY: Low      PLAN:  OT FREQUENCY: 1-2x/week  OT DURATION: 6 weeks (through 09/16/23 as needed- up to 8 total visits)   PLANNED INTERVENTIONS: self care/ADL training, therapeutic exercise, therapeutic activity, neuromuscular re-education, manual therapy, splinting, electrical stimulation, ultrasound, fluidotherapy, compression bandaging, moist heat, cryotherapy, contrast bath, patient/family education, energy conservation, coping strategies training, DME and/or AE instructions, Re-evaluation, and Dry needling  RECOMMENDED OTHER SERVICES: none now   CONSULTED AND AGREED WITH PLAN OF CARE: Patient  PLAN FOR NEXT SESSION:   Check STG, Review/perform and upgrade HEP as tolerated. Check on pain and symptoms, perform manual therapy as tolerated and needed for muscle spasms and stiffness.   Charletta Cousin, Payslee Bateson Beth Dixon, OTR/L 08/11/2023, 1:36 PM

## 2023-08-17 ENCOUNTER — Other Ambulatory Visit: Payer: Self-pay | Admitting: Internal Medicine

## 2023-08-18 ENCOUNTER — Encounter: Payer: BC Managed Care – PPO | Admitting: Occupational Therapy

## 2023-08-25 ENCOUNTER — Ambulatory Visit: Payer: BC Managed Care – PPO | Admitting: Occupational Therapy

## 2023-08-25 ENCOUNTER — Encounter: Payer: Self-pay | Admitting: Occupational Therapy

## 2023-08-25 DIAGNOSIS — M7521 Bicipital tendinitis, right shoulder: Secondary | ICD-10-CM | POA: Diagnosis not present

## 2023-08-25 DIAGNOSIS — M25521 Pain in right elbow: Secondary | ICD-10-CM

## 2023-08-25 DIAGNOSIS — G5611 Other lesions of median nerve, right upper limb: Secondary | ICD-10-CM

## 2023-08-25 DIAGNOSIS — M6281 Muscle weakness (generalized): Secondary | ICD-10-CM

## 2023-08-25 DIAGNOSIS — M25611 Stiffness of right shoulder, not elsewhere classified: Secondary | ICD-10-CM

## 2023-08-25 DIAGNOSIS — M79631 Pain in right forearm: Secondary | ICD-10-CM

## 2023-08-25 NOTE — Therapy (Signed)
OUTPATIENT OCCUPATIONAL THERAPY ORTHO TREATMENT and Discharge Summary  Patient Name: Maria Barker MRN: [id number] DOB:Mar 17, 1961, 62 y.o., female Today's Date: 08/25/2023  PCP: Cheryll Cockayne, MD REFERRING PROVIDER:  Rodolph Bong, MD    END OF SESSION:  OT End of Session - 08/25/23 1019     Visit Number 3    Number of Visits 8    Date for OT Re-Evaluation 09/16/23    Authorization Type BCBS    OT Start Time 1017    OT Stop Time 1049    OT Time Calculation (min) 32 min    Activity Tolerance Patient tolerated treatment well;Patient limited by pain;Patient limited by fatigue    Behavior During Therapy [facility] for tasks assessed/performed               Past Medical History:  Diagnosis Date   Arthritis    R THR 2011   Back pain    Frequent headaches    Hyperlipidemia LDL goal < 130    Hypertension    Hypothyroid 02/06/2015   Dx 01/2015: TSH 5.4   Insomnia    Left hip pain    Pain of left thigh    Past Surgical History:  Procedure Laterality Date   TOTAL HIP ARTHROPLASTY Right 03/31/10   olin   TOTAL HIP ARTHROPLASTY Left 03/08/2016   Procedure: TOTAL HIP ARTHROPLASTY;  Surgeon: Gean Birchwood, MD;  Location: [facility] OR;  Service: Orthopedics;  Laterality: Left;   TUBAL LIGATION     UPPER GASTROINTESTINAL ENDOSCOPY     Patient Active Problem List   Diagnosis Date Noted   Lumbar radiculopathy 05/13/2023   Pain in right hip 04/05/2023   Biceps tendonitis on right 11/08/2022   Allergic rhinitis 12/21/2021   Fatigue 12/21/2021   Lateral epicondylitis of left elbow 10/02/2021   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   Pain of both elbows 05/11/2021   Left leg pain 05/11/2021   Osteopenia - Dr Rana Snare 11/27/2020   Left hip pain 05/08/2020   Rash on lips 01/08/2019   Essential hypertension 01/08/2019   De Quervain's tenosynovitis, right 03/09/2018   Prediabetes 09/17/2017   Osteoarthritis of left knee 04/12/2017   B12 deficiency 08/13/2016   S/P bilateral hip  replacements 08/13/2016   Family history of brain aneurysm 08/12/2015   Gastroesophageal reflux disease without esophagitis 08/12/2015   Hypothyroid 02/06/2015   Hyperlipidemia    Chronic insomnia 04/13/2013    ONSET DATE: 1-2 months onset  REFERRING DIAG:  M75.21 (ICD-10-CM) - Biceps tendonitis on right  G56.11 (ICD-10-CM) - Pronator syndrome of right upper extremity    THERAPY DIAG:  Biceps tendonitis on right  Pronator syndrome of right upper extremity  Muscle weakness (generalized)  Pain in right elbow  Stiffness of right shoulder, not elsewhere classified  Pain in right forearm  Rationale for Evaluation and Treatment: Rehabilitation  SUBJECTIVE:   SUBJECTIVE STATEMENT: 08/25/23: Pt reports that she is feeling better, "I am not concerned any more" Pt is requesting to d/c therapy. Pt reports that she saw Dr Denyse Amass last week and told him she is independent with her HEP and her pain is much better and that she was going to discontinue therapy. "I do my exercises at home the best that I can and it's much better"  PERTINENT HISTORY: Per MD notes: "exacerbation of her R elbow pain thought to be due to distal biceps tendinitis. Pt was last seen by Dr. Denyse Amass on 12/10/22 and pain was improving. She was advised to  cont HEP, Voltaren gel, Tylenol, and was prescribed Celebrex.Marland KitchenMarland KitchenToday, pt reports R elbow pain worsened last week. She was just doing her usual work routine. Pt locates pain to the R cubital fossa and into the anterior aspect of her proximal forward. Pain will radiating pain into R wrist and hand. Today's symptoms are more typical for distal biceps tendinitis and pronator syndrome. "   PRECAUTIONS: None  RED FLAGS: None   WEIGHT BEARING RESTRICTIONS: No  PAIN:  Are you having pain?  Yes: NPRS scale: at rest now: 3/10, in past week at worst up to 3-4/10 Pain location: antecubital region Pain description: stabbing and radiating Aggravating factors: bending  elbow Relieving factors: rest/heat  FALLS: Has patient fallen in last 6 months? No  LIVING ENVIRONMENT: Lives with: lives with their partner (BF) and cat  Has following equipment at home: None  PLOF: Independent  PATIENT GOALS: To decrease pain in right arm and get back to full function   OBJECTIVE: (All objective assessments below are from initial evaluation on: 08/02/23 unless otherwise specified.)   HAND DOMINANCE: Right   ADLs: Overall ADLs: States decreased ability to grab, hold household objects, pain and inability to open containers, perform FMS tasks (manipulate fasteners on clothing), mild to moderate bathing problems as well.    FUNCTIONAL OUTCOME MEASURES: Eval: Quck DASH 52% impairment today  (Higher % Score  =  More Impairment)     UPPER EXTREMITY ROM     Shoulder to Wrist AROM Right eval Left eval 08/25/23 Right  Shoulder flexion 158    Shoulder abduction     Shoulder extension     Shoulder internal rotation 10 25 22   Shoulder external rotation 70 85 85  Elbow flexion 141 144 143  Elbow extension (-22) (-2)  -2  Forearm supination 72 88 84  Forearm pronation  75 63 76  Wrist flexion 74 70 WNL  Wrist extension 36 37 62  (Blank rows = not tested)   Hand AROM Right eval Left eval  Full Fist Ability (or Gap to Distal Palmar Crease)    Thumb Opposition  (Kapandji Scale)     Thumb MCP (0-60)    Thumb IP (0-80)    Thumb Radial Abduction Span     Thumb Palmar Abduction Span     Index MCP (0-90)     Index PIP (0-100)     Index DIP (0-70)      Long MCP (0-90)      Long PIP (0-100)      Long DIP (0-70)      Ring MCP (0-90)      Ring PIP (0-100)      Ring DIP (0-70)      Little MCP (0-90)      Little PIP (0-100)      Little DIP (0-70)      (Blank rows = not tested)   UPPER EXTREMITY MMT:     MMT Right TBD Right 08/25/23  Shoulder flexion    Shoulder abduction    Shoulder adduction    Shoulder extension    Shoulder internal rotation     Shoulder external rotation    Middle trapezius    Lower trapezius    Elbow flexion 4/5 tender 4+/5  Elbow extension 4/5 tender 5/5  Forearm supination 4+/5 tender 5/5  Forearm pronation 4+/5 no pain with bent elbow  4-/5 pain with straight elbow 5/5 No pain   4+/5 No pain  Wrist flexion 4+/5 tender in wrist 5/5  no pain  Wrist extension 4+/5 tender in wrist 5/5 no pain  Wrist ulnar deviation    Wrist radial deviation    (Blank rows = not tested)  HAND FUNCTION: Eval: Observed weakness in affected hand.  Grip strength Right: 37 lbs no pain, Left: 40 lbs  08/25/23: 52.4# pain free with RUE  COORDINATION: Eval: No observed coordination impairments with affected hand.   SENSATION: Eval:  Light touch intact today  EDEMA:   Eval: no swelling  COGNITION: Eval: Overall cognitive status: WFL for evaluation today   OBSERVATIONS:   Eval: neg tinel's at pronator, neg scratch collapse at carpal tunnel, elbow, pronator, etc.  Seems to be no nerve involvement or complaints at this time (at least after taking gabapentin).   Has large, painful muscle spasms to biceps, lateral shoulder and upper trap/lev scap. Some TTP to pronator/flexor group    TODAY'S TREATMENT:  08/25/23: Manual therapy and stretch/cross friction massage x10 min to  distal biceps and pronator/flexor group of right forearm. Pt A/ROM in R UE is improved and pain is decreased. Reassessment as per above for A/ROM, MMT and grip strength. Verbal review and performance of HEP as outlined below (please refer to Medbridge), pt is Mod I noted. Upgraded putty to red. Verbal discussion: pt will transition to independent HEP, focus on positioning at home and work and to f/u with MD should her symptoms return was discussed/reviewed. Verbally discussed ADL's and positioning for sleep, work, reviewed good body mechanics and taking rest breaks as needed. Pt denies any further needs at this time and requests d/c to independent HEP. She has  met 4/6 LTG's and the remaining 2 are ongoing. Pt reports that she is confident that she will continue to improve on her own.   08/11/23: Reviewed and performed HEP as outlined during initial evaluation (see medbridge HEP below for details). Pt required min verbal cues to hold stretches and positioning noted, especially during sleeper stretch, where pt felt more of a stretch in her posterior shoulder when her shoulder/elbow were in proper position. Each exercise was performed x10 reps.  Pt reports pain with forearm pronation and supination stretches noted. Introduced forearm stretch with forearm supinated, elbow flexed then extended with combined passive wrist extension using left hand. Discussed positioning during sleep using pillows between knees when laying on her right side due to c/o knee pain, as well as using pillow under right wrist/forearm. Manual therapy and stretch/cross friction massage x10 min to  distal biceps and pronator/flexor group of right forearm. Discussed importance of stretch breaks at home after ADL's/IADL's and at work to assist with decreasing pain. Pt verbalized understanding in the clinic.  08/02/23: Post-evaluation treatment:   She was given self-care education to not cause herself pain during daily routines and with stretches that would be assigned.  OT educated that this is likely an overuse syndrome or strain to the right arm with which she had had multiple problems in the past.  OT then assigns home exercise program as listed below, demonstrates each 1 to her and has her perform back with no significant pains.  Exercises - Seated Scapular Retraction  - 4 x daily - 5-10 reps - Sleeper Stretch  - 3-4 x daily - 3-5 reps - 15 hold - Forearm Supination Stretch  - 3-4 x daily - 3-5 reps - 15 sec hold - Forearm Pronation Stretch  - 3-4 x daily - 3-5 reps - 15 sec hold - Wrist Prayer Stretch  - 4 x  daily - 3-5 reps - 15 sec hold - Doorway Stretches (both arms low, lean in gently)    - 3-4 x daily - 3-5 reps - 15 hold    PATIENT EDUCATION: Education details: See tx section above for details  Person educated: Patient Education method: Verbal Instruction, Teach back, Handouts  Education comprehension: States and demonstrates understanding, Additional Education required    HOME EXERCISE PROGRAM: Access Code: [email or web address] URL: [email or web address] Date: 08/02/2023 Prepared by: Fannie Knee   GOALS: Goals reviewed with patient? Yes   SHORT TERM GOALS: (STG required if POC>30 days) Target Date: 08/19/23  Pt will demo/state understanding of initial HEP to improve pain levels and prerequisite motion. Goal status: Met    LONG TERM GOALS: Target Date: 09/16/23  Pt will improve functional ability by decreased impairment per Quick DASH assessment from 52% to 20% or better, for better quality of life. Goal status: Ongoing  2.  Pt will improve grip strength in Rt hand from 37lbs to at least 42lbs for functional use at home and in IADLs. Goal status: Met 08/25/23 Rt 52.4# pain free with RUE  3.  Pt will improve A/ROM in Rt sh IR from 10* to at least 35*, to have functional motion for tasks like reach and grasp.  Goal status: Ongoing 08/25/23 Rt 22*  4.  Pt will improve strength in Rt FA pronation (w/elbow extended) from 4-/5 painful MMT to at least 4+/5 MMT to have increased functional ability to carry out selfcare and higher-level homecare tasks with no difficulty. Goal status: Met 08/25/23: Rt 4+/5 No pain with elbow extended  5.  Pt will improve A/ROM in Rt elbow ext from (-22) to at least (-6), to have functional motion for tasks like reach and grasp.  Goal status: Met 08/25/23: -2* elbow exten  6.  Pt will decrease pain at rest from 7/10 to 3/10 or better to have better sleep and occupational participation in daily roles. Goal status: Met 08/25/23   ASSESSMENT:  CLINICAL IMPRESSION: 08/25/23: Reassessment today for A/ROM, MMT and grip strength.  Verbal review and performance of HEP, pt is Mod I at this time. Upgraded putty to red should assist with continued strengthening. Verbal discussion per pt request that she will transition to independent HEP, focus on positioning at home and work and to f/u with MD should her symptoms return was discussed/reviewed. Verbally discussed ADL's and positioning for sleep, work, reviewed good body mechanics and taking rest breaks as needed. Pt denies any further needs at this time and requests d/c to independent HEP. She has met 4/6 LTG's and the remaining 2 are ongoing. Pt reports that she is confident that she will continue to improve on her own. Will d/c at this time.   PERFORMANCE DEFICITS: in functional skills including ADLs, IADLs, coordination, ROM, strength, pain, fascial restrictions, muscle spasms, flexibility, Gross motor control, body mechanics, endurance, decreased knowledge of precautions, and UE functional use, cognitive skills including problem solving and safety awareness, and psychosocial skills including coping strategies, environmental adaptation, and habits.   IMPAIRMENTS: are limiting patient from ADLs, IADLs, rest and sleep, work, and leisure.   COMORBIDITIES: may have co-morbidities  that affects occupational performance. Patient will benefit from skilled OT to address above impairments and improve overall function.  MODIFICATION OR ASSISTANCE TO COMPLETE EVALUATION: No modification of tasks or assist necessary to complete an evaluation.  OT OCCUPATIONAL PROFILE AND HISTORY: Detailed assessment: Review of records and additional review of physical, cognitive, psychosocial history related to current  functional performance.  CLINICAL DECISION MAKING: Moderate - several treatment options, min-mod task modification necessary  REHAB POTENTIAL: Good  EVALUATION COMPLEXITY: Low      PLAN:  OT FREQUENCY: 1-2x/week  OT DURATION: 6 weeks (through 09/16/23 as needed- up to 8 total  visits)   PLANNED INTERVENTIONS: self care/ADL training, therapeutic exercise, therapeutic activity, neuromuscular re-education, manual therapy, splinting, electrical stimulation, ultrasound, fluidotherapy, compression bandaging, moist heat, cryotherapy, contrast bath, patient/family education, energy conservation, coping strategies training, DME and/or AE instructions, Re-evaluation, and Dry needling  RECOMMENDED OTHER SERVICES: none now   CONSULTED AND AGREED WITH PLAN OF CARE: Patient  PLAN FOR NEXT SESSION:   08/25/23: D/c per pt request, pt will continue HEP I'ly and f/u with MD PRN.  OCCUPATIONAL THERAPY DISCHARGE SUMMARY  Visits from Start of Care: 3/8  Current functional level related to goals / functional outcomes: Pt reports overall decreased pain, improved strength for functional use of R UE at work and home. She reports Mod I ADL's and IADL's and is Mod I HEP.   Remaining deficits: A/ROM for IR remains impaired but improving slowly, Pt reports that she wishes to d/c to independent HEP,    Education / Equipment: F/u with MD PRN, continue independent HEP.   Patient agrees to discharge. Patient goals were  Partially met (4/6 goals with remaining 2 goals ongoing . Patient is being discharged due to  Pt request, improved pain/symptoms, financial reasons/co-pays.    Mariam Dollar Beth Dixon, OTR/L 08/25/2023, 12:32 PM

## 2023-08-30 ENCOUNTER — Encounter: Payer: BC Managed Care – PPO | Admitting: Occupational Therapy

## 2023-09-30 ENCOUNTER — Other Ambulatory Visit: Payer: Self-pay | Admitting: Internal Medicine

## 2023-09-30 DIAGNOSIS — R195 Other fecal abnormalities: Secondary | ICD-10-CM

## 2023-09-30 DIAGNOSIS — Z1212 Encounter for screening for malignant neoplasm of rectum: Secondary | ICD-10-CM

## 2023-09-30 DIAGNOSIS — Z1211 Encounter for screening for malignant neoplasm of colon: Secondary | ICD-10-CM

## 2023-10-10 DIAGNOSIS — Z1211 Encounter for screening for malignant neoplasm of colon: Secondary | ICD-10-CM | POA: Diagnosis not present

## 2023-10-10 DIAGNOSIS — Z1212 Encounter for screening for malignant neoplasm of rectum: Secondary | ICD-10-CM | POA: Diagnosis not present

## 2023-10-16 LAB — COLOGUARD: COLOGUARD: POSITIVE — AB

## 2023-10-17 NOTE — Addendum Note (Signed)
Addended by: Pincus Sanes on: 10/17/2023 07:21 AM   Modules accepted: Orders

## 2023-11-04 ENCOUNTER — Encounter: Payer: BC Managed Care – PPO | Admitting: Internal Medicine

## 2023-11-13 NOTE — Progress Notes (Unsigned)
Subjective:    Patient ID: Maria Barker, female    DOB: 31-Aug-1961, 62 y.o.   MRN: [id number]     HPI Maria Barker is here for follow up of her chronic medical problems.  Still having pain in right elbow and right wrist.  She did try wearing a brace, but it did not help because it made the pain worse.  She does use the arm daily at her work.  She has seen sports medicine for the pain.  Lower back pain radiating down both legs - was only in the left leg and now it is both legs.  Gets worse pain when standing long periods of time.  She is the pain sitting or no matter what activity she is doing.  Medications and allergies reviewed with patient and updated if appropriate.  Current Outpatient Medications on File Prior to Visit  Medication Sig Dispense Refill   atorvastatin (LIPITOR) 10 MG tablet Take 1 tablet by mouth once daily 90 tablet 2   Calcium Carbonate-Vitamin D (CALCIUM-VITAMIN D3 PO) Take 1 each by mouth 2 (two) times daily.     diclofenac (VOLTAREN) 75 MG EC tablet Take 1 tablet (75 mg total) by mouth 2 (two) times daily. 30 tablet 2   estradiol (ESTRACE) 0.1 MG/GM vaginal cream Place vaginally.     gabapentin (NEURONTIN) 100 MG capsule Take 1-2 capsules (100-200 mg total) by mouth at bedtime. 60 capsule 3   levothyroxine (SYNTHROID) 50 MCG tablet TAKE 1 TABLET BY MOUTH ONCE DAILY FOR 6 DAYS OF THE WEEK 30 tablet 3   melatonin 5 MG TABS Take 5 mg by mouth daily as needed.     olmesartan (BENICAR) 5 MG tablet Take 1 tablet by mouth once daily 90 tablet 1   Omega-3 1000 MG CAPS Take 2,000 mg by mouth.     omeprazole (PRILOSEC) 20 MG capsule Take 1 capsule (20 mg total) by mouth daily as needed. 90 capsule 3   tiZANidine (ZANAFLEX) 4 MG tablet Take 1 tablet (4 mg total) by mouth every 6 (six) hours as needed for muscle spasms. 30 tablet 2   No current facility-administered medications on file prior to visit.     Review of Systems  Constitutional:  Negative for fever.   Respiratory:  Negative for cough, shortness of breath and wheezing.   Cardiovascular:  Negative for chest pain, palpitations and leg swelling.  Neurological:  Negative for light-headedness and headaches.       Objective:   Vitals:   11/14/23 1036  BP: 118/80  Pulse: 66  Temp: 98.2 F (36.8 C)  SpO2: 98%   BP Readings from Last 3 Encounters:  11/14/23 118/80  07/20/23 122/76  05/13/23 120/80   Wt Readings from Last 3 Encounters:  11/14/23 132 lb (59.9 kg)  07/20/23 128 lb (58.1 kg)  05/13/23 130 lb (59 kg)   Body mass index is 26.66 kg/m.    Physical Exam Constitutional:      General: She is not in acute distress.    Appearance: Normal appearance.  HENT:     Head: Normocephalic and atraumatic.  Eyes:     Conjunctiva/sclera: Conjunctivae normal.  Cardiovascular:     Rate and Rhythm: Normal rate and regular rhythm.     Heart sounds: Normal heart sounds.  Pulmonary:     Effort: Pulmonary effort is normal. No respiratory distress.     Breath sounds: Normal breath sounds. No wheezing.  Musculoskeletal:     Cervical back:  Neck supple.     Right lower leg: No edema.     Left lower leg: No edema.  Lymphadenopathy:     Cervical: No cervical adenopathy.  Skin:    General: Skin is warm and dry.     Findings: No rash.  Neurological:     Mental Status: She is alert. Mental status is at baseline.  Psychiatric:        Mood and Affect: Mood normal.        Behavior: Behavior normal.        Lab Results  Component Value Date   WBC 6.9 05/13/2023   HGB 12.1 05/13/2023   HCT 37.0 05/13/2023   PLT 321.0 05/13/2023   GLUCOSE 87 05/13/2023   CHOL 163 05/13/2023   TRIG 94.0 05/13/2023   HDL 54.90 05/13/2023   LDLDIRECT 93.7 12/21/2013   LDLCALC 89 05/13/2023   ALT 22 05/13/2023   AST 26 05/13/2023   NA 133 (L) 05/13/2023   K 3.8 05/13/2023   CL 101 05/13/2023   CREATININE 0.59 05/13/2023   BUN 14 05/13/2023   CO2 26 05/13/2023   TSH 2.37 05/13/2023   INR  1.18 02/27/2016   HGBA1C 6.1 05/13/2023     Assessment & Plan:    See Problem List for Assessment and Plan of chronic medical problems.   Flu shot given.  Mammo, DEXA and Pap up-to-date-will request reports

## 2023-11-13 NOTE — Patient Instructions (Addendum)
     Flu immunization administered today.     Blood work was ordered.   The lab is on the first floor.    Medications changes include :   None    A referral was ordered for [facility] for your right arm pain and someone will call you to schedule an appointment.     Return in about 6 months (around 05/13/2024) for Physical Exam.

## 2023-11-14 ENCOUNTER — Ambulatory Visit: Payer: BC Managed Care – PPO | Admitting: Internal Medicine

## 2023-11-14 ENCOUNTER — Encounter: Payer: Self-pay | Admitting: Internal Medicine

## 2023-11-14 VITALS — BP 118/80 | HR 66 | Temp 98.2°F | Ht 59.0 in | Wt 132.0 lb

## 2023-11-14 DIAGNOSIS — Z23 Encounter for immunization: Secondary | ICD-10-CM

## 2023-11-14 DIAGNOSIS — M5441 Lumbago with sciatica, right side: Secondary | ICD-10-CM

## 2023-11-14 DIAGNOSIS — E782 Mixed hyperlipidemia: Secondary | ICD-10-CM | POA: Diagnosis not present

## 2023-11-14 DIAGNOSIS — I10 Essential (primary) hypertension: Secondary | ICD-10-CM

## 2023-11-14 DIAGNOSIS — K219 Gastro-esophageal reflux disease without esophagitis: Secondary | ICD-10-CM

## 2023-11-14 DIAGNOSIS — E538 Deficiency of other specified B group vitamins: Secondary | ICD-10-CM

## 2023-11-14 DIAGNOSIS — E038 Other specified hypothyroidism: Secondary | ICD-10-CM

## 2023-11-14 DIAGNOSIS — R7303 Prediabetes: Secondary | ICD-10-CM | POA: Diagnosis not present

## 2023-11-14 DIAGNOSIS — M5442 Lumbago with sciatica, left side: Secondary | ICD-10-CM

## 2023-11-14 DIAGNOSIS — M79631 Pain in right forearm: Secondary | ICD-10-CM | POA: Insufficient documentation

## 2023-11-14 DIAGNOSIS — G8929 Other chronic pain: Secondary | ICD-10-CM | POA: Insufficient documentation

## 2023-11-14 LAB — LIPID PANEL
Cholesterol: 168 mg/dL (ref 0–200)
HDL: 55.1 mg/dL (ref >39.00)
LDL Cholesterol: 87 mg/dL (ref 0–99)
NonHDL: 113.31
Total CHOL/HDL Ratio: 3
Triglycerides: 131 mg/dL (ref 0.0–149.0)
VLDL: 26.2 mg/dL (ref 0.0–40.0)

## 2023-11-14 LAB — COMPREHENSIVE METABOLIC PANEL
ALT: 18 U/L (ref 0–35)
AST: 21 U/L (ref 0–37)
Albumin: 4.3 g/dL (ref 3.5–5.2)
Alkaline Phosphatase: 64 U/L (ref 39–117)
BUN: 14 mg/dL (ref 6–23)
CO2: 24 meq/L (ref 19–32)
Calcium: 9.1 mg/dL (ref 8.4–10.5)
Chloride: 101 meq/L (ref 96–112)
Creatinine, Ser: 0.54 mg/dL (ref 0.40–1.20)
GFR: 100.07 mL/min (ref >60.00)
Glucose, Bld: 87 mg/dL (ref 70–99)
Potassium: 3.7 meq/L (ref 3.5–5.1)
Sodium: 133 meq/L — ABNORMAL LOW (ref 135–145)
Total Bilirubin: 0.4 mg/dL (ref 0.2–1.2)
Total Protein: 8.5 g/dL — ABNORMAL HIGH (ref 6.0–8.3)

## 2023-11-14 LAB — HEMOGLOBIN A1C: Hgb A1c MFr Bld: 6.2 % (ref 4.6–6.5)

## 2023-11-14 LAB — TSH: TSH: 1.3 u[IU]/mL (ref 0.35–5.50)

## 2023-11-14 NOTE — Assessment & Plan Note (Signed)
Chronic Continue vitamin B12 daily

## 2023-11-14 NOTE — Assessment & Plan Note (Signed)
Chronic Has gotten worse Initially was only involving 1 leg and no involves both legs Referral to orthopedics

## 2023-11-14 NOTE — Assessment & Plan Note (Addendum)
Chronic Blood pressure controlled  CMP, CBC Continue Benicar 5 mg daily

## 2023-11-14 NOTE — Assessment & Plan Note (Signed)
Chronic Lab Results  Component Value Date   HGBA1C 6.1 05/13/2023   Check a1c Low sugar / carb diet Stressed regular exercise

## 2023-11-14 NOTE — Assessment & Plan Note (Signed)
Chronic Regular exercise and healthy diet encouraged Check lipid panel, CMP Continue atorvastatin 10 mg daily 

## 2023-11-14 NOTE — Assessment & Plan Note (Signed)
Chronic Has known tendinitis Will refer to orthopedics for further evaluation and treatment

## 2023-11-14 NOTE — Assessment & Plan Note (Signed)
Chronic  Clinically euthyroid Check tsh and will titrate med dose if needed Currently taking levothyroxine 50 mcg 6 days a week

## 2023-11-14 NOTE — Addendum Note (Signed)
Addended by: Karma Ganja on: 11/14/2023 04:39 PM   Modules accepted: Orders

## 2023-11-14 NOTE — Assessment & Plan Note (Signed)
Chronic GERD controlled Continue omeprazole 20 mg daily as needed

## 2024-01-03 ENCOUNTER — Encounter: Payer: Self-pay | Admitting: Internal Medicine

## 2024-01-03 ENCOUNTER — Encounter: Payer: Self-pay | Admitting: Gastroenterology

## 2024-01-03 ENCOUNTER — Telehealth: Payer: Self-pay

## 2024-01-05 DIAGNOSIS — M545 Low back pain, unspecified: Secondary | ICD-10-CM | POA: Diagnosis not present

## 2024-01-05 DIAGNOSIS — M25521 Pain in right elbow: Secondary | ICD-10-CM | POA: Diagnosis not present

## 2024-01-09 DIAGNOSIS — M4316 Spondylolisthesis, lumbar region: Secondary | ICD-10-CM | POA: Diagnosis not present

## 2024-01-12 DIAGNOSIS — M79601 Pain in right arm: Secondary | ICD-10-CM | POA: Diagnosis not present

## 2024-01-12 DIAGNOSIS — M7521 Bicipital tendinitis, right shoulder: Secondary | ICD-10-CM | POA: Diagnosis not present

## 2024-01-17 DIAGNOSIS — M4316 Spondylolisthesis, lumbar region: Secondary | ICD-10-CM | POA: Diagnosis not present

## 2024-01-20 DIAGNOSIS — M4316 Spondylolisthesis, lumbar region: Secondary | ICD-10-CM | POA: Diagnosis not present

## 2024-01-24 ENCOUNTER — Ambulatory Visit (AMBULATORY_SURGERY_CENTER): Payer: BC Managed Care – PPO

## 2024-01-24 VITALS — Ht 59.0 in | Wt 134.0 lb

## 2024-01-24 DIAGNOSIS — Z1211 Encounter for screening for malignant neoplasm of colon: Secondary | ICD-10-CM

## 2024-01-24 MED ORDER — SUFLAVE 178.7 G PO SOLR: 1.0000 | Freq: Once | ORAL | 0 refills | Status: AC

## 2024-01-24 NOTE — Progress Notes (Signed)

## 2024-01-27 DIAGNOSIS — M4316 Spondylolisthesis, lumbar region: Secondary | ICD-10-CM | POA: Diagnosis not present

## 2024-01-30 DIAGNOSIS — M7521 Bicipital tendinitis, right shoulder: Secondary | ICD-10-CM | POA: Diagnosis not present

## 2024-01-30 DIAGNOSIS — M79601 Pain in right arm: Secondary | ICD-10-CM | POA: Diagnosis not present

## 2024-02-02 ENCOUNTER — Other Ambulatory Visit: Payer: Self-pay | Admitting: Internal Medicine

## 2024-02-03 DIAGNOSIS — M4316 Spondylolisthesis, lumbar region: Secondary | ICD-10-CM | POA: Diagnosis not present

## 2024-02-06 DIAGNOSIS — M545 Low back pain, unspecified: Secondary | ICD-10-CM | POA: Diagnosis not present

## 2024-02-15 ENCOUNTER — Encounter: Payer: BC Managed Care – PPO | Admitting: Gastroenterology

## 2024-02-24 DIAGNOSIS — M545 Low back pain, unspecified: Secondary | ICD-10-CM | POA: Diagnosis not present

## 2024-03-05 DIAGNOSIS — M5416 Radiculopathy, lumbar region: Secondary | ICD-10-CM | POA: Diagnosis not present

## 2024-03-06 ENCOUNTER — Other Ambulatory Visit: Payer: Self-pay | Admitting: Internal Medicine

## 2024-03-06 ENCOUNTER — Encounter: Payer: Self-pay | Admitting: Gastroenterology

## 2024-03-12 ENCOUNTER — Encounter: Payer: BC Managed Care – PPO | Admitting: Gastroenterology

## 2024-03-13 ENCOUNTER — Ambulatory Visit (AMBULATORY_SURGERY_CENTER): Payer: BC Managed Care – PPO | Admitting: Internal Medicine

## 2024-03-13 ENCOUNTER — Encounter: Payer: Self-pay | Admitting: Internal Medicine

## 2024-03-13 VITALS — BP 120/60 | HR 60 | Temp 97.3°F | Resp 20 | Ht 59.0 in | Wt 134.0 lb

## 2024-03-13 DIAGNOSIS — K648 Other hemorrhoids: Secondary | ICD-10-CM | POA: Diagnosis not present

## 2024-03-13 DIAGNOSIS — K5140 Inflammatory polyps of colon without complications: Secondary | ICD-10-CM | POA: Diagnosis not present

## 2024-03-13 DIAGNOSIS — Z1211 Encounter for screening for malignant neoplasm of colon: Secondary | ICD-10-CM

## 2024-03-13 DIAGNOSIS — D125 Benign neoplasm of sigmoid colon: Secondary | ICD-10-CM

## 2024-03-13 DIAGNOSIS — D124 Benign neoplasm of descending colon: Secondary | ICD-10-CM

## 2024-03-13 DIAGNOSIS — K635 Polyp of colon: Secondary | ICD-10-CM

## 2024-03-13 DIAGNOSIS — D123 Benign neoplasm of transverse colon: Secondary | ICD-10-CM

## 2024-03-13 MED ORDER — SODIUM CHLORIDE 0.9 % IV SOLN: 500.0000 mL | Freq: Once | INTRAVENOUS | Status: DC

## 2024-03-13 NOTE — Progress Notes (Signed)
 GASTROENTEROLOGY PROCEDURE H&P NOTE   Primary Care Physician: Pincus Sanes, MD    Reason for Procedure:   Colon cancer screening  Plan:    Colonoscopy  Patient is appropriate for endoscopic procedure(s) in the ambulatory ([facility]) setting.  The nature of the procedure, as well as the risks, benefits, and alternatives were carefully and thoroughly reviewed with the patient. Ample time for discussion and questions allowed. The patient understood, was satisfied, and agreed to proceed.     HPI: Maria Barker is a 63 y.o. female who presents for colonoscopy for colon cancer screening. Denies blood in stools, changes in bowel habits, or unintentional weight loss. Denies family history of colon cancer.  Colonoscopy 09/04/13: Diminutive polyp (HP)   Past Medical History:  Diagnosis Date   Arthritis    R THR 2011   Back pain    Frequent headaches    Hyperlipidemia LDL goal < 130    Hypertension    Hypothyroid 02/06/2015   Dx 01/2015: TSH 5.4   Insomnia    Left hip pain    Pain of left thigh     Past Surgical History:  Procedure Laterality Date   TOTAL HIP ARTHROPLASTY Right 03/31/10   olin   TOTAL HIP ARTHROPLASTY Left 03/08/2016   Procedure: TOTAL HIP ARTHROPLASTY;  Surgeon: Gean Birchwood, MD;  Location: [facility] OR;  Service: Orthopedics;  Laterality: Left;   TUBAL LIGATION     UPPER GASTROINTESTINAL ENDOSCOPY      Prior to Admission medications   Medication Sig Start Date End Date Taking? Authorizing Provider  Ascorbic Acid (C 1000 PO) Take by mouth.   Yes [provider]  atorvastatin (LIPITOR) 10 MG tablet Take 1 tablet by mouth once daily 07/11/23  Yes Burns, Bobette Mo, MD  celecoxib (CELEBREX) 200 MG capsule Take 200 mg by mouth daily. 02/21/24  Yes [provider]  levothyroxine (SYNTHROID) 50 MCG tablet TAKE 1 TABLET BY MOUTH ONCE DAILY FOR  6  DAYS  A  WEEK 02/02/24  Yes Burns, Bobette Mo, MD  melatonin 5 MG TABS Take 5 mg by mouth daily as needed.   Yes [provider]  olmesartan (BENICAR) 5 MG tablet Take 1 tablet by mouth once daily 03/06/24  Yes Burns, Bobette Mo, MD  Omega-3 1000 MG CAPS Take 2,000 mg by mouth.   Yes [provider]  omeprazole (PRILOSEC) 20 MG capsule Take 1 capsule (20 mg total) by mouth daily as needed. 05/13/23  Yes Burns, Bobette Mo, MD  Calcium Carbonate-Vitamin D (CALCIUM-VITAMIN D3 PO) Take 1 each by mouth 2 (two) times daily. Patient not taking: Reported on 01/24/2024    [provider]  Cholecalciferol (D3 ADULT PO) Take by mouth.    [provider]  Cyanocobalamin (B-12 PO) Take by mouth.    [provider]  diclofenac (VOLTAREN) 75 MG EC tablet Take 1 tablet (75 mg total) by mouth 2 (two) times daily. Patient not taking: Reported on 03/13/2024 04/05/23   Tarry Kos, MD  estradiol (ESTRACE) 0.1 MG/GM vaginal cream Place vaginally. Patient not taking: Reported on 03/13/2024 10/26/20   [provider]  gabapentin (NEURONTIN) 100 MG capsule Take 1-2 capsules (100-200 mg total) by mouth at bedtime. Patient not taking: Reported on 03/13/2024 05/13/23   Pincus Sanes, MD  tiZANidine (ZANAFLEX) 4 MG tablet Take 1 tablet (4 mg total) by mouth every 6 (six) hours as needed for muscle spasms. Patient not taking: Reported on 03/13/2024 04/05/23  Tarry Kos, MD    Current Outpatient Medications  Medication Sig Dispense Refill   Ascorbic Acid (C 1000 PO) Take by mouth.     atorvastatin (LIPITOR) 10 MG tablet Take 1 tablet by mouth once daily 90 tablet 2   celecoxib (CELEBREX) 200 MG capsule Take 200 mg by mouth daily.     levothyroxine (SYNTHROID) 50 MCG tablet TAKE 1 TABLET BY MOUTH ONCE DAILY FOR  6  DAYS  A  WEEK 30 tablet 0   melatonin 5 MG TABS Take 5 mg by mouth daily as needed.     olmesartan (BENICAR) 5 MG tablet Take 1 tablet by mouth once daily 90 tablet 0   Omega-3 1000 MG CAPS Take 2,000 mg by mouth.     omeprazole (PRILOSEC) 20 MG capsule Take 1 capsule (20 mg total) by  mouth daily as needed. 90 capsule 3   Calcium Carbonate-Vitamin D (CALCIUM-VITAMIN D3 PO) Take 1 each by mouth 2 (two) times daily. (Patient not taking: Reported on 01/24/2024)     Cholecalciferol (D3 ADULT PO) Take by mouth.     Cyanocobalamin (B-12 PO) Take by mouth.     diclofenac (VOLTAREN) 75 MG EC tablet Take 1 tablet (75 mg total) by mouth 2 (two) times daily. (Patient not taking: Reported on 03/13/2024) 30 tablet 2   estradiol (ESTRACE) 0.1 MG/GM vaginal cream Place vaginally. (Patient not taking: Reported on 03/13/2024)     gabapentin (NEURONTIN) 100 MG capsule Take 1-2 capsules (100-200 mg total) by mouth at bedtime. (Patient not taking: Reported on 03/13/2024) 60 capsule 3   tiZANidine (ZANAFLEX) 4 MG tablet Take 1 tablet (4 mg total) by mouth every 6 (six) hours as needed for muscle spasms. (Patient not taking: Reported on 03/13/2024) 30 tablet 2   Current Facility-Administered Medications  Medication Dose Route Frequency Provider Last Rate Last Admin   0.9 %  sodium chloride infusion  500 mL Intravenous Once Imogene Burn, MD        Allergies as of 03/13/2024 - Review Complete 03/13/2024  Allergen Reaction Noted   Asa [aspirin] Nausea And Vomiting 08/21/2013   Ibuprofen Nausea Only 11/09/2019    Family History  Problem Relation Age of Onset   CVA Mother    Hypertension Mother    CVA Sister    Colon cancer Neg Hx    Colon polyps Neg Hx    Esophageal cancer Neg Hx    Stomach cancer Neg Hx    Rectal cancer Neg Hx     Social History   Socioeconomic History   Marital status: Single    Spouse name: Not on file   Number of children: 5   Years of education: Not on file   Highest education level: Not on file  Occupational History   Occupation: Location manager    Employer: [employer]  Tobacco Use   Smoking status: Never    Passive exposure: Never   Smokeless tobacco: Never  Vaping Use   Vaping status: Never Used  Substance and Sexual Activity   Alcohol use: No    Drug use: No   Sexual activity: Not on file  Other Topics Concern   Not on file  Social History Narrative   Not on file   Social Drivers of Health   Financial Resource Strain: Not on file  Food Insecurity: Not on file  Transportation Needs: Not on file  Physical Activity: Not on file  Stress: Not on file  Social Connections: Not on  file  Intimate Partner Violence: Not on file    Physical Exam: Vital signs in last 24 hours: BP 136/88   Pulse 66   Temp (!) 97.3 F (36.3 C) (Skin)   Ht 4\' 11"  (1.499 m)   Wt 134 lb (60.8 kg)   SpO2 95%   BMI 27.06 kg/m  GEN: NAD EYE: Sclerae anicteric ENT: MMM CV: Non-tachycardic Pulm: No increased work of breathing GI: Soft, NT/ND NEURO:  Alert & Oriented   Eulah Pont, MD [facility]  03/13/2024 9:16 AM

## 2024-03-13 NOTE — Progress Notes (Signed)
 Pt's states no medical or surgical changes since previsit or office visit.

## 2024-03-13 NOTE — Patient Instructions (Addendum)
-

## 2024-03-13 NOTE — Op Note (Signed)
 [facility] Patient Name: Maria Barker Procedure Date: 03/13/2024 9:24 AM MRN: [id number] Endoscopist: Madelyn Brunner [place] , , [id number] Age: 63 Referring MD:  Date of Birth: Jul 05, 1961 Gender: Female Account #: [id number] Procedure:                Colonoscopy Indications:              Screening for colorectal malignant neoplasm Medicines:                Monitored Anesthesia Care Procedure:                Pre-Anesthesia Assessment:                           - Prior to the procedure, a History and Physical                            was performed, and patient medications and                            allergies were reviewed. The patient's tolerance of                            previous anesthesia was also reviewed. The risks                            and benefits of the procedure and the sedation                            options and risks were discussed with the patient.                            All questions were answered, and informed consent                            was obtained. Prior Anticoagulants: The patient has                            taken no anticoagulant or antiplatelet agents. ASA                            Grade Assessment: II - A patient with mild systemic                            disease. After reviewing the risks and benefits,                            the patient was deemed in satisfactory condition to                            undergo the procedure.                           After obtaining informed consent, the colonoscope  was passed under direct vision. Throughout the                            procedure, the patient's blood pressure, pulse, and                            oxygen saturations were monitored continuously. The                            Olympus Scope [id number] was introduced through the                            anus and advanced to the the terminal ileum. The                             colonoscopy was performed without difficulty. The                            patient tolerated the procedure well. The quality                            of the bowel preparation was excellent. The                            terminal ileum, ileocecal valve, appendiceal                            orifice, and rectum were photographed. Scope In: 9:30:44 AM Scope Out: 9:47:26 AM Scope Withdrawal Time: 0 hours 12 minutes 47 seconds  Total Procedure Duration: 0 hours 16 minutes 42 seconds  Findings:                 The terminal ileum appeared normal.                           Five sessile polyps were found in the sigmoid                            colon, descending colon and transverse colon. The                            polyps were 3 to 6 mm in size. These polyps were                            removed with a cold snare. Resection and retrieval                            were complete.                           Non-bleeding internal hemorrhoids were found during                            retroflexion. Complications:  No immediate complications. Estimated Blood Loss:     Estimated blood loss was minimal. Impression:               - The examined portion of the ileum was normal.                           - Five 3 to 6 mm polyps in the sigmoid colon, in                            the descending colon and in the transverse colon,                            removed with a cold snare. Resected and retrieved.                           - Non-bleeding internal hemorrhoids. Recommendation:           - Discharge patient to home (with escort).                           - Await pathology results.                           - The findings and recommendations were discussed                            with the patient. Dr Particia Lather "Alan Ripper" Leonides Schanz,  03/13/2024 9:52:17 AM

## 2024-03-13 NOTE — Progress Notes (Signed)
 Called to room to assist during endoscopic procedure.  Patient ID and intended procedure confirmed with present staff. Received instructions for my participation in the procedure from the performing physician.

## 2024-03-13 NOTE — Progress Notes (Signed)
 Vss nad trans to pacu

## 2024-03-14 ENCOUNTER — Telehealth: Payer: Self-pay | Admitting: *Deleted

## 2024-03-15 ENCOUNTER — Encounter: Payer: Self-pay | Admitting: Internal Medicine

## 2024-03-15 LAB — SURGICAL PATHOLOGY

## 2024-03-19 NOTE — Telephone Encounter (Signed)
PA taken care of.

## 2024-03-26 ENCOUNTER — Other Ambulatory Visit: Payer: Self-pay | Admitting: Internal Medicine

## 2024-03-26 NOTE — Telephone Encounter (Signed)
 MD Leonides Schanz notified.

## 2024-03-29 DIAGNOSIS — M5416 Radiculopathy, lumbar region: Secondary | ICD-10-CM | POA: Diagnosis not present

## 2024-04-23 DIAGNOSIS — M5416 Radiculopathy, lumbar region: Secondary | ICD-10-CM | POA: Diagnosis not present

## 2024-05-12 ENCOUNTER — Encounter: Payer: Self-pay | Admitting: Internal Medicine

## 2024-05-12 NOTE — Progress Notes (Signed)
 Subjective:    Patient ID: Maria Barker, female    DOB: 11-07-1961, 63 y.o.   MRN: [id number]      HPI Maria Barker is here for a Physical exam and her chronic medical problems.    Lower back pain w/ radiculopathy - had injection about one month ago  The afternoon she had the injection - she had left sided achy chest pain that lasted a couple of hours and then had pain went down to stomach.  The stomach pain, tightening lasted 3 days.   She has not had any symptoms since then.       Medications and allergies reviewed with patient and updated if appropriate.  Current Outpatient Medications on File Prior to Visit  Medication Sig Dispense Refill   Ascorbic Acid (C 1000 PO) Take by mouth.     atorvastatin  (LIPITOR) 10 MG tablet Take 1 tablet by mouth once daily 90 tablet 2   Calcium  Carbonate-Vitamin D  (CALCIUM -VITAMIN D3 PO) Take 1 each by mouth 2 (two) times daily.     celecoxib  (CELEBREX ) 200 MG capsule Take 200 mg by mouth daily.     Cholecalciferol  (D3 ADULT PO) Take by mouth.     Cyanocobalamin  (B-12 PO) Take by mouth.     diclofenac  (VOLTAREN ) 75 MG EC tablet Take 1 tablet (75 mg total) by mouth 2 (two) times daily. 30 tablet 2   estradiol (ESTRACE) 0.1 MG/GM vaginal cream Place vaginally.     gabapentin  (NEURONTIN ) 100 MG capsule Take 1-2 capsules (100-200 mg total) by mouth at bedtime. 60 capsule 3   levothyroxine  (SYNTHROID ) 50 MCG tablet TAKE 1 TABLET BY MOUTH ONCE DAILY 6  DAYS  A  WEEK 30 tablet 0   melatonin 5 MG TABS Take 5 mg by mouth daily as needed.     olmesartan  (BENICAR ) 5 MG tablet Take 1 tablet by mouth once daily 90 tablet 0   Omega-3 1000 MG CAPS Take 2,000 mg by mouth.     omeprazole  (PRILOSEC) 20 MG capsule Take 1 capsule (20 mg total) by mouth daily as needed. 90 capsule 3   tiZANidine  (ZANAFLEX ) 4 MG tablet Take 1 tablet (4 mg total) by mouth every 6 (six) hours as needed for muscle spasms. 30 tablet 2   No current facility-administered medications on file  prior to visit.    Review of Systems  Constitutional:  Negative for fever.  Eyes:  Negative for visual disturbance.  Respiratory:  Negative for cough, shortness of breath and wheezing.   Cardiovascular:  Positive for chest pain (one episode of left sided chest pain) and leg swelling (only a couple of times). Negative for palpitations.  Gastrointestinal:  Negative for abdominal pain, blood in stool, constipation and diarrhea.       Occ gerd  Genitourinary:  Negative for dysuria.  Musculoskeletal:  Positive for arthralgias (knee, hands) and back pain (with radiculopathy).  Skin:  Positive for rash (itching posterior neck).       Itching posterior neck x 1 month when she sleep  Neurological:  Negative for light-headedness and headaches.  Psychiatric/Behavioral:  Positive for sleep disturbance. Negative for dysphoric mood. The patient is not nervous/anxious.        Objective:   Vitals:   05/14/24 1012  BP: 112/66  Pulse: 66  Temp: 98 F (36.7 C)  SpO2: 98%   Filed Weights   05/14/24 1012  Weight: 134 lb (60.8 kg)   Body mass index is 27.06 kg/m.  BP Readings  from Last 3 Encounters:  05/14/24 112/66  03/13/24 120/60  11/14/23 118/80    Wt Readings from Last 3 Encounters:  05/14/24 134 lb (60.8 kg)  03/13/24 134 lb (60.8 kg)  01/24/24 134 lb (60.8 kg)       Physical Exam Constitutional: She appears well-developed and well-nourished. No distress.  HENT:  Head: Normocephalic and atraumatic.  Right Ear: External ear normal. Normal ear canal and TM Left Ear: External ear normal.  Normal ear canal and TM Mouth/Throat: Oropharynx is clear and moist.  Eyes: Conjunctivae normal.  Neck: Neck supple. No tracheal deviation present. No thyromegaly present.  No carotid bruit  Cardiovascular: Normal rate, regular rhythm and normal heart sounds.   No murmur heard.  No edema. Pulmonary/Chest: Effort normal and breath sounds normal. No respiratory distress. She has no wheezes.  She has no rales.  Breast: deferred   Abdominal: Soft. She exhibits no distension. There is no tenderness.  Lymphadenopathy: She has no cervical adenopathy.  Skin: Skin is warm and dry. She is not diaphoretic.  Psychiatric: She has a normal mood and affect. Her behavior is normal.     Lab Results  Component Value Date   WBC 6.9 05/13/2023   HGB 12.1 05/13/2023   HCT 37.0 05/13/2023   PLT 321.0 05/13/2023   GLUCOSE 87 11/14/2023   CHOL 168 11/14/2023   TRIG 131.0 11/14/2023   HDL 55.10 11/14/2023   LDLDIRECT 93.7 12/21/2013   LDLCALC 87 11/14/2023   ALT 18 11/14/2023   AST 21 11/14/2023   NA 133 (L) 11/14/2023   K 3.7 11/14/2023   CL 101 11/14/2023   CREATININE 0.54 11/14/2023   BUN 14 11/14/2023   CO2 24 11/14/2023   TSH 1.30 11/14/2023   INR 1.18 02/27/2016   HGBA1C 6.2 11/14/2023         Assessment & Plan:   Physical exam: Screening blood work  ordered Exercise  none-encouraged regular exercise Weight  is good Substance abuse  none   Reviewed recommended immunizations.   Health Maintenance  Topic Date Due   Cervical Cancer Screening (HPV/Pap Cotest)  05/12/2021   MAMMOGRAM  09/10/2022   COVID-19 Vaccine (3 - 2024-25 season) 08/28/2023   INFLUENZA VACCINE  07/27/2024   DTaP/Tdap/Td (2 - Td or Tdap) 02/04/2025   Colonoscopy  03/13/2034   Hepatitis C Screening  Completed   HIV Screening  Completed   HPV VACCINES  Aged Out   Meningococcal B Vaccine  Aged Out   Fecal DNA (Cologuard)  Discontinued   Zoster Vaccines- Shingrix  Discontinued          See Problem List for Assessment and Plan of chronic medical problems.

## 2024-05-12 NOTE — Patient Instructions (Addendum)
 Blood work was ordered.       Medications changes include :   triamcinolone  cream for rash on posterior neck    EKG done today.     Return in about 6 months (around 11/14/2024) for follow up.    Health Maintenance, Female Adopting a healthy lifestyle and getting preventive care are important in promoting health and wellness. Ask your health care provider about: The right schedule for you to have regular tests and exams. Things you can do on your own to prevent diseases and keep yourself healthy. What should I know about diet, weight, and exercise? Eat a healthy diet  Eat a diet that includes plenty of vegetables, fruits, low-fat dairy products, and lean protein. Do not eat a lot of foods that are high in solid fats, added sugars, or sodium. Maintain a healthy weight Body mass index (BMI) is used to identify weight problems. It estimates body fat based on height and weight. Your health care provider can help determine your BMI and help you achieve or maintain a healthy weight. Get regular exercise Get regular exercise. This is one of the most important things you can do for your health. Most adults should: Exercise for at least 150 minutes each week. The exercise should increase your heart rate and make you sweat (moderate-intensity exercise). Do strengthening exercises at least twice a week. This is in addition to the moderate-intensity exercise. Spend less time sitting. Even light physical activity can be beneficial. Watch cholesterol and blood lipids Have your blood tested for lipids and cholesterol at 61 years of age, then have this test every 5 years. Have your cholesterol levels checked more often if: Your lipid or cholesterol levels are high. You are older than 61 years of age. You are at high risk for heart disease. What should I know about cancer screening? Depending on your health history and family history, you may need to have cancer screening at various  ages. This may include screening for: Breast cancer. Cervical cancer. Colorectal cancer. Skin cancer. Lung cancer. What should I know about heart disease, diabetes, and high blood pressure? Blood pressure and heart disease High blood pressure causes heart disease and increases the risk of stroke. This is more likely to develop in people who have high blood pressure readings or are overweight. Have your blood pressure checked: Every 3-5 years if you are 46-51 years of age. Every year if you are 3 years old or older. Diabetes Have regular diabetes screenings. This checks your fasting blood sugar level. Have the screening done: Once every three years after age 23 if you are at a normal weight and have a low risk for diabetes. More often and at a younger age if you are overweight or have a high risk for diabetes. What should I know about preventing infection? Hepatitis B If you have a higher risk for hepatitis B, you should be screened for this virus. Talk with your health care provider to find out if you are at risk for hepatitis B infection. Hepatitis C Testing is recommended for: Everyone born from [birth year] through 1963. Anyone with known risk factors for hepatitis C. Sexually transmitted infections (STIs) Get screened for STIs, including gonorrhea and chlamydia, if: You are sexually active and are younger than 61 years of age. You are older than 61 years of age and your health care provider tells you that you are at risk for this type of infection. Your sexual activity has changed since you  were last screened, and you are at increased risk for chlamydia or gonorrhea. Ask your health care provider if you are at risk. Ask your health care provider about whether you are at high risk for HIV. Your health care provider may recommend a prescription medicine to help prevent HIV infection. If you choose to take medicine to prevent HIV, you should first get tested for HIV. You should then be tested  every 3 months for as long as you are taking the medicine. Pregnancy If you are about to stop having your period (premenopausal) and you may become pregnant, seek counseling before you get pregnant. Take 400 to 800 micrograms (mcg) of folic acid  every day if you become pregnant. Ask for birth control (contraception) if you want to prevent pregnancy. Osteoporosis and menopause Osteoporosis is a disease in which the bones lose minerals and strength with aging. This can result in bone fractures. If you are 15 years old or older, or if you are at risk for osteoporosis and fractures, ask your health care provider if you should: Be screened for bone loss. Take a calcium  or vitamin D  supplement to lower your risk of fractures. Be given hormone replacement therapy (HRT) to treat symptoms of menopause. Follow these instructions at home: Alcohol use Do not drink alcohol if: Your health care provider tells you not to drink. You are pregnant, may be pregnant, or are planning to become pregnant. If you drink alcohol: Limit how much you have to: 0-1 drink a day. Know how much alcohol is in your drink. In the [place] one drink equals one 12 oz bottle of beer (355 mL), one 5 oz glass of wine (148 mL), or one 1 oz glass of hard liquor (44 mL). Lifestyle Do not use any products that contain nicotine or tobacco. These products include cigarettes, chewing tobacco, and vaping devices, such as e-cigarettes. If you need help quitting, ask your health care provider. Do not use street drugs. Do not share needles. Ask your health care provider for help if you need support or information about quitting drugs. General instructions Schedule regular health, dental, and eye exams. Stay current with your vaccines. Tell your health care provider if: You often feel depressed. You have ever been abused or do not feel safe at home. Summary Adopting a healthy lifestyle and getting preventive care are important in promoting  health and wellness. Follow your health care provider's instructions about healthy diet, exercising, and getting tested or screened for diseases. Follow your health care provider's instructions on monitoring your cholesterol and blood pressure. This information is not intended to replace advice given to you by your health care provider. Make sure you discuss any questions you have with your health care provider. Document Revised: 05/04/2021 Document Reviewed: 05/04/2021 Elsevier Patient Education  2024 [employer].

## 2024-05-14 ENCOUNTER — Ambulatory Visit (INDEPENDENT_AMBULATORY_CARE_PROVIDER_SITE_OTHER): Admitting: Internal Medicine

## 2024-05-14 VITALS — BP 112/66 | HR 66 | Temp 98.0°F | Ht 59.0 in | Wt 134.0 lb

## 2024-05-14 DIAGNOSIS — Z0000 Encounter for general adult medical examination without abnormal findings: Secondary | ICD-10-CM | POA: Diagnosis not present

## 2024-05-14 DIAGNOSIS — E038 Other specified hypothyroidism: Secondary | ICD-10-CM

## 2024-05-14 DIAGNOSIS — K219 Gastro-esophageal reflux disease without esophagitis: Secondary | ICD-10-CM

## 2024-05-14 DIAGNOSIS — R7303 Prediabetes: Secondary | ICD-10-CM | POA: Diagnosis not present

## 2024-05-14 DIAGNOSIS — I10 Essential (primary) hypertension: Secondary | ICD-10-CM

## 2024-05-14 DIAGNOSIS — E538 Deficiency of other specified B group vitamins: Secondary | ICD-10-CM | POA: Diagnosis not present

## 2024-05-14 DIAGNOSIS — R079 Chest pain, unspecified: Secondary | ICD-10-CM | POA: Insufficient documentation

## 2024-05-14 DIAGNOSIS — E782 Mixed hyperlipidemia: Secondary | ICD-10-CM

## 2024-05-14 LAB — LIPID PANEL
Cholesterol: 143 mg/dL (ref 0–200)
HDL: 58.9 mg/dL (ref >39.00)
LDL Cholesterol: 72 mg/dL (ref 0–99)
NonHDL: 83.68
Total CHOL/HDL Ratio: 2
Triglycerides: 57 mg/dL (ref 0.0–149.0)
VLDL: 11.4 mg/dL (ref 0.0–40.0)

## 2024-05-14 LAB — COMPREHENSIVE METABOLIC PANEL WITH GFR
ALT: 22 U/L (ref 0–35)
AST: 26 U/L (ref 0–37)
Albumin: 4.2 g/dL (ref 3.5–5.2)
Alkaline Phosphatase: 64 U/L (ref 39–117)
BUN: 17 mg/dL (ref 6–23)
CO2: 25 meq/L (ref 19–32)
Calcium: 9 mg/dL (ref 8.4–10.5)
Chloride: 101 meq/L (ref 96–112)
Creatinine, Ser: 0.52 mg/dL (ref 0.40–1.20)
GFR: 100.63 mL/min (ref >60.00)
Glucose, Bld: 95 mg/dL (ref 70–99)
Potassium: 3.9 meq/L (ref 3.5–5.1)
Sodium: 131 meq/L — ABNORMAL LOW (ref 135–145)
Total Bilirubin: 0.5 mg/dL (ref 0.2–1.2)
Total Protein: 8.8 g/dL — ABNORMAL HIGH (ref 6.0–8.3)

## 2024-05-14 LAB — CBC WITH DIFFERENTIAL/PLATELET
Basophils Absolute: 0.1 10*3/uL (ref 0.0–0.1)
Basophils Relative: 1.1 % (ref 0.0–3.0)
Eosinophils Absolute: 0.5 10*3/uL (ref 0.0–0.7)
Eosinophils Relative: 9 % — ABNORMAL HIGH (ref 0.0–5.0)
HCT: 36.5 % (ref 36.0–46.0)
Hemoglobin: 11.9 g/dL — ABNORMAL LOW (ref 12.0–15.0)
Lymphocytes Relative: 34.2 % (ref 12.0–46.0)
Lymphs Abs: 1.8 10*3/uL (ref 0.7–4.0)
MCHC: 32.5 g/dL (ref 30.0–36.0)
MCV: 84.1 fl (ref 78.0–100.0)
Monocytes Absolute: 0.5 10*3/uL (ref 0.1–1.0)
Monocytes Relative: 9.6 % (ref 3.0–12.0)
Neutro Abs: 2.5 10*3/uL (ref 1.4–7.7)
Neutrophils Relative %: 46.1 % (ref 43.0–77.0)
Platelets: 329 10*3/uL (ref 150.0–400.0)
RBC: 4.34 Mil/uL (ref 3.87–5.11)
RDW: 13.3 % (ref 11.5–15.5)
WBC: 5.4 10*3/uL (ref 4.0–10.5)

## 2024-05-14 LAB — HEMOGLOBIN A1C: Hgb A1c MFr Bld: 6.2 % (ref 4.6–6.5)

## 2024-05-14 MED ORDER — OLMESARTAN MEDOXOMIL 5 MG PO TABS: 5.0000 mg | ORAL_TABLET | Freq: Every day | ORAL | 1 refills | Status: DC

## 2024-05-14 MED ORDER — TRIAMCINOLONE ACETONIDE 0.1 % EX CREA: 1.0000 | TOPICAL_CREAM | Freq: Two times a day (BID) | CUTANEOUS | 0 refills | Status: AC

## 2024-05-14 NOTE — Assessment & Plan Note (Signed)
 Chronic Continue vitamin B12 daily Check B12 level

## 2024-05-14 NOTE — Assessment & Plan Note (Signed)
 New She had 1 episode of left-sided chest pain and epigastric discomfort - It occurred the afternoon that she had the epidural for her chronic back pain It occurred when she was at rest She has not had any other episodes Likely just related to high-dose steroid EKG today-sinus bradycardia at 57 bpm, otherwise normal EKG.  No significant change compared to EKG from 2022 Discussed CT scan of the heart arteries or cardiology referral which she deferred for now She would like to monitor and if she has any other episodes she will let me know

## 2024-05-14 NOTE — Assessment & Plan Note (Signed)
Chronic  Clinically euthyroid Check tsh and will titrate med dose if needed Currently taking levothyroxine 50 mcg 6 days a week

## 2024-05-14 NOTE — Assessment & Plan Note (Signed)
 Chronic Lab Results  Component Value Date   HGBA1C 6.2 11/14/2023   Check a1c Low sugar / carb diet Stressed regular exercise

## 2024-05-14 NOTE — Assessment & Plan Note (Signed)
 Chronic Blood pressure controlled  CMP, CBC Continue Benicar 5 mg daily

## 2024-05-14 NOTE — Assessment & Plan Note (Signed)
 Chronic Regular exercise and healthy diet encouraged Check lipid panel, CMP Continue atorvastatin 10 mg daily

## 2024-05-14 NOTE — Assessment & Plan Note (Signed)
Chronic GERD controlled Continue omeprazole 20 mg daily as needed

## 2024-05-15 LAB — TSH: TSH: 1.56 u[IU]/mL (ref 0.35–5.50)

## 2024-05-15 LAB — VITAMIN B12: Vitamin B-12: 726 pg/mL (ref 211–911)

## 2024-05-17 ENCOUNTER — Ambulatory Visit: Payer: Self-pay | Admitting: Internal Medicine

## 2024-05-18 ENCOUNTER — Encounter: Payer: BC Managed Care – PPO | Admitting: Internal Medicine

## 2024-05-22 ENCOUNTER — Other Ambulatory Visit: Payer: Self-pay | Admitting: Internal Medicine

## 2024-06-13 DIAGNOSIS — Z124 Encounter for screening for malignant neoplasm of cervix: Secondary | ICD-10-CM | POA: Diagnosis not present

## 2024-06-13 DIAGNOSIS — Z6828 Body mass index (BMI) 28.0-28.9, adult: Secondary | ICD-10-CM | POA: Diagnosis not present

## 2024-06-13 DIAGNOSIS — Z1231 Encounter for screening mammogram for malignant neoplasm of breast: Secondary | ICD-10-CM | POA: Diagnosis not present

## 2024-06-13 DIAGNOSIS — Z01419 Encounter for gynecological examination (general) (routine) without abnormal findings: Secondary | ICD-10-CM | POA: Diagnosis not present

## 2024-06-13 LAB — HM MAMMOGRAPHY

## 2024-06-19 LAB — HM PAP SMEAR

## 2024-07-10 ENCOUNTER — Other Ambulatory Visit: Payer: Self-pay | Admitting: Internal Medicine

## 2024-07-22 ENCOUNTER — Other Ambulatory Visit: Payer: Self-pay | Admitting: Internal Medicine

## 2024-08-21 ENCOUNTER — Other Ambulatory Visit (INDEPENDENT_AMBULATORY_CARE_PROVIDER_SITE_OTHER): Payer: Self-pay

## 2024-08-21 ENCOUNTER — Ambulatory Visit: Admitting: Physician Assistant

## 2024-08-21 ENCOUNTER — Encounter: Payer: Self-pay | Admitting: Physician Assistant

## 2024-08-21 DIAGNOSIS — G8929 Other chronic pain: Secondary | ICD-10-CM

## 2024-08-21 DIAGNOSIS — M545 Low back pain, unspecified: Secondary | ICD-10-CM | POA: Diagnosis not present

## 2024-08-21 MED ORDER — PREDNISONE 10 MG (21) PO TBPK
ORAL_TABLET | ORAL | 0 refills | Status: DC
Start: 2024-08-21 — End: 2024-10-08

## 2024-08-21 NOTE — Progress Notes (Signed)
 Office Visit Note   Patient: Maria Barker           Date of Birth: 1961-11-06           MRN: [id number] Visit Date: 08/21/2024              Requested by: Geofm Glade PARAS, MD [address] [place],  [place] [address] PCP: Geofm Glade PARAS, MD   Assessment & Plan: Visit Diagnoses:  1. Chronic midline low back pain, unspecified whether sciatica present     Plan: chronic low back pain with bilateral lower extremity radiculopathy.  She has not had relieve with PT, prescription medication or esi/facet block? Injections.  MRI shows facet arthropathy and disc bulging.  Would like to refer to Dr. Eldonna for injection.  I have sent in a steroid taper to take in the meantime.  Follow-Up Instructions: Return for f/u with Dr. Eldonna for ESI vs facet block.   Orders:  Orders Placed This Encounter  Procedures   XR Lumbar Spine 2-3 Views   Ambulatory referral to Physical Medicine Rehab   Meds ordered this encounter  Medications   predniSONE  (STERAPRED UNI-PAK 21 TAB) 10 MG (21) TBPK tablet    Sig: Take as directed on box    Dispense:  21 tablet    Refill:  0      Procedures: No procedures performed   Clinical Data: No additional findings.   Subjective: Chief Complaint  Patient presents with   Lower Back - Pain    HPI patient is a pleasant 63 year old female who is here today with a [place] interpreter.  She is here with chronic bilateral low back pain and bilateral lower extremity radiculopathy.  She has been seen by us  in the past for this where she has been on prescription medication as well as been to physical therapy.  She has most recently been seen by [facility] orthopedics with subsequent MRI and what sounds like either ESI or facet block by Dr. Cyrena.  She denies any relief following the injection is here today for further evaluation treatment recommendation.  She is having pain across the entire lower back radiating down both legs.  Pain is constant but worse with walking as  well as sleeping and bending over.  She has been taking Tylenol , NSAIDs, muscle relaxers and gabapentin  without relief.  She has started noticing burning down both legs.  No weakness, bowel or bladder incontinence.  Review of Systems as detailed in HPI.  All others reviewed and are negative   Objective: Vital Signs: There were no vitals taken for this visit.  Physical Exam well developed and well nourished female in no acute distress.  Alert and oriented x 3  Ortho Exam lumbar spine exam: spinous and bilateral paraspinous muscular tenderness.  Increased pain with lumbar extension.  Positive straight leg raise both sides.  No focal weakness.  Neurovascularly intact distally.    Specialty Comments:  No specialty comments available.  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   Pain of both elbows 05/11/2021   Left leg pain 05/11/2021   Osteopenia - Dr Marget 11/27/2020   Left hip pain 05/08/2020   Rash on lips 01/08/2019   Essential hypertension 01/08/2019   De Quervain's tenosynovitis, right 03/09/2018   Prediabetes 09/17/2017   Osteoarthritis of left knee 04/12/2017   B12 deficiency 08/13/2016   S/P bilateral hip replacements 08/13/2016   Family history of brain aneurysm 08/12/2015   Gastroesophageal reflux disease without esophagitis 08/12/2015   Hypothyroid 02/06/2015   Hyperlipidemia    Chronic insomnia 04/13/2013   Past Medical History:   Diagnosis Date   Arthritis    R THR 2011   Back pain    Frequent headaches    Hyperlipidemia LDL goal < 130    Hypertension    Hypothyroid 02/06/2015   Dx 01/2015: TSH 5.4   Insomnia    Left hip pain    Pain of left thigh     Family History  Problem Relation Age of Onset   CVA Mother    Hypertension Mother    CVA Sister    Colon cancer Neg Hx    Colon polyps Neg Hx    Esophageal cancer Neg Hx    Stomach cancer Neg Hx    Rectal cancer Neg Hx     Past Surgical History:  Procedure Laterality Date   TOTAL HIP ARTHROPLASTY Right 03/31/10   olin   TOTAL HIP ARTHROPLASTY Left 03/08/2016   Procedure: TOTAL HIP ARTHROPLASTY;  Surgeon: Dempsey Sensor, MD;  Location: [facility] OR;  Service: Orthopedics;  Laterality: Left;   TUBAL LIGATION     UPPER GASTROINTESTINAL ENDOSCOPY     Social History   Occupational History   Occupation: Location manager    Employer: [employer]  Tobacco Use   Smoking status: Never    Passive exposure: Never   Smokeless tobacco: Never  Vaping Use   Vaping status: Never Used  Substance and Sexual Activity   Alcohol use: No   Drug use: No   Sexual activity: Not on file

## 2024-08-27 ENCOUNTER — Other Ambulatory Visit: Payer: Self-pay | Admitting: Internal Medicine

## 2024-09-06 ENCOUNTER — Other Ambulatory Visit: Payer: Self-pay | Admitting: Internal Medicine

## 2024-09-10 ENCOUNTER — Ambulatory Visit: Admitting: Physical Medicine and Rehabilitation

## 2024-09-10 ENCOUNTER — Encounter: Payer: Self-pay | Admitting: Physical Medicine and Rehabilitation

## 2024-09-10 DIAGNOSIS — M5416 Radiculopathy, lumbar region: Secondary | ICD-10-CM | POA: Diagnosis not present

## 2024-09-10 DIAGNOSIS — M5442 Lumbago with sciatica, left side: Secondary | ICD-10-CM | POA: Diagnosis not present

## 2024-09-10 DIAGNOSIS — M4316 Spondylolisthesis, lumbar region: Secondary | ICD-10-CM

## 2024-09-10 DIAGNOSIS — M47816 Spondylosis without myelopathy or radiculopathy, lumbar region: Secondary | ICD-10-CM | POA: Diagnosis not present

## 2024-09-10 DIAGNOSIS — G8929 Other chronic pain: Secondary | ICD-10-CM

## 2024-09-10 DIAGNOSIS — M5441 Lumbago with sciatica, right side: Secondary | ICD-10-CM

## 2024-09-10 MED ORDER — CELECOXIB 200 MG PO CAPS: 200.0000 mg | ORAL_CAPSULE | Freq: Every day | ORAL | 0 refills | Status: DC

## 2024-09-10 NOTE — Progress Notes (Unsigned)
 Maria Barker - 63 y.o. female MRN [id number]  Date of birth: 10/28/1961  Office Visit Note: Visit Date: 09/10/2024 PCP: Geofm Glade PARAS, MD Referred by: Geofm Glade PARAS, MD  Subjective: Chief Complaint  Patient presents with   Lower Back - Pain   HPI: Maria Barker is a 63 y.o. female who comes in today per the request of Dr. Ozell Cummins for evaluation of chronic, worsening and severe bilateral lower back pain radiating to buttocks and posterior legs to heels. Also reports intermittent numbness/tingling to bilateral lower extremities. Laotian interpreter at bedside. Pain ongoing for several months. Her pain becomes worse with prolonged standing and lifting. She describes pain as sharp and stabbing sensation, currently rates as 10 out of 10. Some relief of pain with home exercise regimen, rest and use of medications. History of formal physical therapy with minimal relief of pain. Lumbar MRI imaging from March of 2025 shows degenerative anterolisthesis of 3 mm at L3-L4 and 2 mm at L4-L5. There is advanced facet arthropathy at L3-L4 with gaping fluid filled joints, there is stenosis of the canal and lateral recesses left more than right, likely to cause neural compression. Bilateral facet hypertrophy and lateral recess stenosis at L4-L5. She was previously treated by Dr. Scot Flatten with [facility]. States he underwent lumbar injection in his office in March of this year with no relief of pain. She also consulted with Dr. Tess as well, per patient he did recommend surgical intervention. Patient denies focal weakness. No recent trauma or falls.        Review of Systems  Musculoskeletal:  Positive for back pain.  Neurological:  Positive for tingling. Negative for focal weakness and weakness.  All other systems reviewed and are negative.  Otherwise per HPI.  Assessment & Plan: Visit Diagnoses:    ICD-10-CM   1. Chronic bilateral low back pain with bilateral sciatica  M54.42 Ambulatory  referral to Physical Medicine Rehab   M54.41    G89.29     2. Radiculopathy, lumbar region  M54.16 Ambulatory referral to Physical Medicine Rehab    3. Spondylolisthesis, lumbar region  M43.16 Ambulatory referral to Physical Medicine Rehab    4. Facet arthropathy, lumbar  M47.816 Ambulatory referral to Physical Medicine Rehab       Plan: Findings:  Chronic, worsening and severe bilateral lower back pain radiating to buttocks and posterior legs to heels. Paresthesias noted to bilateral lower extremities. Patient continues to have severe pain despite good conservative therapies such as formal physical therapy, home exercise regimen, rest and use of medications. Prior lumbar MRI imaging does show multi level degenerative spondylolisthesis, facet arthropathy and lateral recess stenosis. No severe central canal stenosis noted. I was able to see injection charges from Dr. Flatten, looks like he performed 1 level transforaminal epidural steroid injection. We discussed treatment plan in detail today. Next step is to place order for diagnostic and hopefully therapeutic left L4-L5 interlaminar epidural steroid injection under fluoroscopic guidance. If good relief of pain with injection we can repeat this procedure infrequently as needed. I explained to patient that this is a different approach compared to prior injection. Should her lower back pain persist would consider lumbar facet joint injections. She has no questions at this time. No red flag symptoms noted upon exam today.     Meds & Orders:  Meds ordered this encounter  Medications   celecoxib  (CELEBREX ) 200 MG capsule    Sig: Take 1 capsule (200 mg total) by mouth daily.  Dispense:  60 capsule    Refill:  0    Orders Placed This Encounter  Procedures   Ambulatory referral to Physical Medicine Rehab    Follow-up: Return for Left L4-L5 interlaminar epidural steroid injection.   Procedures: No procedures performed      Clinical  History: CLINICAL DATA: Chronic lumbar region back pain radiating to both legs  EXAM: MRI LUMBAR SPINE WITHOUT CONTRAST  TECHNIQUE: Multiplanar, multisequence MR imaging of the lumbar spine was performed. No intravenous contrast was administered.  COMPARISON: Radiography 05/19/2022  FINDINGS: Segmentation: 5 lumbar type vertebral bodies.  Alignment: Degenerative anterolisthesis of 3 mm at L3-4 and 2 mm at L4-5.  Vertebrae: No fracture or focal bone lesion. Facet edematous changes in the lower lumbar spine as below.  Conus medullaris and cauda equina: Conus extends to the L1 level. Conus and cauda equina appear normal.  Paraspinal and other soft tissues: Negative  Disc levels:  No significant finding from T11-12 through L2-3.  L3-4: Advanced bilateral facet arthropathy with gaping, fluid-filled joints, worse on the right than the left. Anterolisthesis of 3 mm that would likely worsen with standing or flexion. Bulging of the disc. Stenosis of the canal and lateral recesses left more than right. This appearance would probably worsen with standing or flexion.  L4-5: Bilateral facet degeneration and hypertrophy. Anterolisthesis of 2 mm. Shallow protrusion of the disc. Stenosis of both lateral recesses that could cause neural compression on either or both sides.  L5-S1: Minimal disc bulge. Mild facet osteoarthritis. No stenosis.  IMPRESSION: 1. L3-4: Advanced bilateral facet arthropathy with gaping, fluid-filled joints, worse on the right than the left. Anterolisthesis of 3 mm that would likely worsen with standing or flexion. Bulging of the disc. Stenosis of the canal and lateral recesses left more than right, likely to cause neural compression. This appearance would probably worsen with standing or flexion. 2. L4-5: Bilateral facet degeneration and hypertrophy. Anterolisthesis of 2 mm. Shallow protrusion of the disc. Stenosis of both lateral recesses that could cause  neural compression on either or both sides. 3. L5-S1: Minimal disc bulge. Mild facet osteoarthritis. No stenosis.   Electronically Signed By: Oneil Officer M.D. On: 03/02/2024 12:02   She reports that she has never smoked. She has never been exposed to tobacco smoke. She has never used smokeless tobacco.  Recent Labs    11/14/23 1127 05/14/24 1104  HGBA1C 6.2 6.2    Objective:  VS:  HT:    WT:   BMI:     BP:   HR: bpm  TEMP: ( )  RESP:  Physical Exam Vitals and nursing note reviewed.  HENT:     Head: Normocephalic and atraumatic.     Right Ear: External ear normal.     Left Ear: External ear normal.     Nose: Nose normal.     Mouth/Throat:     Mouth: Mucous membranes are moist.  Eyes:     Extraocular Movements: Extraocular movements intact.  Cardiovascular:     Rate and Rhythm: Normal rate.     Pulses: Normal pulses.  Pulmonary:     Effort: Pulmonary effort is normal.  Abdominal:     General: Abdomen is flat. There is no distension.  Musculoskeletal:        General: Tenderness present.     Cervical back: Normal range of motion.     Comments: Patient rises from seated position to standing without difficulty. Good lumbar range of motion. No pain noted with facet loading.  5/5 strength noted with bilateral hip flexion, knee flexion/extension, ankle dorsiflexion/plantarflexion and EHL. No clonus noted bilaterally. No pain upon palpation of greater trochanters. No pain with internal/external rotation of bilateral hips. Sensation intact bilaterally. Negative slump test bilaterally. Ambulates without aid, gait steady.     Skin:    General: Skin is warm and dry.     Capillary Refill: Capillary refill takes less than 2 seconds.  Neurological:     General: No focal deficit present.     Mental Status: She is alert and oriented to person, place, and time.  Psychiatric:        Mood and Affect: Mood normal.        Behavior: Behavior normal.     Ortho Exam  Imaging: No  results found.  Past Medical/Family/Surgical/Social History: Medications & Allergies reviewed per EMR, new medications updated. 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   Pain of both elbows 05/11/2021   Left leg pain 05/11/2021   Osteopenia - Dr Marget 11/27/2020   Left hip pain 05/08/2020   Rash on lips 01/08/2019   Essential hypertension 01/08/2019   De Quervain's tenosynovitis, right 03/09/2018   Prediabetes 09/17/2017   Osteoarthritis of left knee 04/12/2017   B12 deficiency 08/13/2016   S/P bilateral hip replacements 08/13/2016   Family history of brain aneurysm 08/12/2015   Gastroesophageal reflux disease without esophagitis 08/12/2015   Hypothyroid 02/06/2015   Hyperlipidemia    Chronic insomnia 04/13/2013   Past Medical History:  Diagnosis Date   Arthritis    R THR 2011   Back pain    Frequent headaches    Hyperlipidemia LDL goal < 130    Hypertension    Hypothyroid 02/06/2015   Dx 01/2015: TSH 5.4   Insomnia    Left hip pain    Pain of left thigh    Family History  Problem Relation Age of Onset   CVA Mother    Hypertension Mother    CVA Sister    Colon cancer Neg Hx    Colon polyps Neg Hx    Esophageal cancer Neg Hx    Stomach cancer Neg Hx    Rectal cancer Neg Hx    Past Surgical History:  Procedure Laterality Date   TOTAL HIP ARTHROPLASTY Right 03/31/10   olin   TOTAL HIP ARTHROPLASTY Left 03/08/2016   Procedure: TOTAL HIP ARTHROPLASTY;  Surgeon: Dempsey Sensor, MD;  Location: [facility] OR;  Service: Orthopedics;  Laterality: Left;   TUBAL LIGATION      UPPER GASTROINTESTINAL ENDOSCOPY     Social History   Occupational History   Occupation: Location manager    Employer: [employer]  Tobacco Use   Smoking status: Never    Passive exposure: Never   Smokeless tobacco: Never  Vaping Use   Vaping status: Never Used  Substance and Sexual Activity   Alcohol use: No   Drug use: No   Sexual activity: Not on file

## 2024-09-10 NOTE — Progress Notes (Unsigned)
 Pain Scale   Average Pain 10 Patient advising she has lower back pain radiating to bilateral legs and pain increasing when standing and walking, pain decreases when sitting         +Driver, -BT, -Dye Allergies.

## 2024-09-10 NOTE — Progress Notes (Unsigned)
 Core Outcome Measures Index (COMI) Back Score  Average Pain 10  COMI Score 90 %

## 2024-10-08 ENCOUNTER — Encounter: Payer: Self-pay | Admitting: Physical Medicine and Rehabilitation

## 2024-10-08 ENCOUNTER — Ambulatory Visit: Admitting: Physical Medicine and Rehabilitation

## 2024-10-08 ENCOUNTER — Other Ambulatory Visit: Payer: Self-pay

## 2024-10-08 VITALS — BP 163/85 | HR 71

## 2024-10-08 DIAGNOSIS — M47816 Spondylosis without myelopathy or radiculopathy, lumbar region: Secondary | ICD-10-CM

## 2024-10-08 MED ORDER — METHYLPREDNISOLONE ACETATE 80 MG/ML IJ SUSP
40.0000 mg | Freq: Once | INTRAMUSCULAR | Status: AC
  Administered 2024-10-08: 40 mg

## 2024-10-08 NOTE — Procedures (Signed)
 Lumbar Epidural Steroid Injection - Interlaminar Approach with Fluoroscopic Guidance  Patient: Maria Barker      Date of Birth: Sep 08, 1961 MRN: [id number] PCP: Geofm Glade PARAS, MD      Visit Date: 10/08/2024   Universal Protocol:     Consent Given By: the patient  Position: PRONE  Additional Comments: Vital signs were monitored before and after the procedure. Patient was prepped and draped in the usual sterile fashion. The correct patient, procedure, and site was verified.   Injection Procedure Details:   Procedure diagnoses: Spondylosis without myelopathy or radiculopathy, lumbar region [M47.816]   Meds Administered:  Meds ordered this encounter  Medications   methylPREDNISolone  acetate (DEPO-MEDROL ) injection 40 mg     Laterality: Left  Location/Site:  L4-5  Needle: 3.5 in., 20 ga. Tuohy  Needle Placement: Paramedian epidural  Findings:   -Comments: Excellent flow of contrast into the epidural space.  Procedure Details: Using a paramedian approach from the side mentioned above, the region overlying the inferior lamina was localized under fluoroscopic visualization and the soft tissues overlying this structure were infiltrated with 4 ml. of 1% Lidocaine  without Epinephrine . The Tuohy needle was inserted into the epidural space using a paramedian approach.   The epidural space was localized using loss of resistance along with counter oblique bi-planar fluoroscopic views.  After negative aspirate for air, blood, and CSF, a 2 ml. volume of Isovue-250 was injected into the epidural space and the flow of contrast was observed. Radiographs were obtained for documentation purposes.    The injectate was administered into the level noted above.   Additional Comments:  The patient tolerated the procedure well Dressing: 2 x 2 sterile gauze and Band-Aid    Post-procedure details: Patient was observed during the procedure. Post-procedure instructions were reviewed.  Patient  left the clinic in stable condition.

## 2024-10-08 NOTE — Progress Notes (Signed)
 Pain Scale   Average Pain 10 Patient advising she has chronic lower back pain radiating bilaterally to legs and feet. Patient advising her pain is constant.         +Driver, -BT, -Dye Allergies.

## 2024-10-08 NOTE — Progress Notes (Signed)
 Maria Barker - 63 y.o. female MRN [id number]  Date of birth: 1961/03/29  Office Visit Note: Visit Date: 10/08/2024 PCP: Geofm Glade PARAS, MD Referred by: Geofm Glade PARAS, MD  Subjective: Chief Complaint  Patient presents with   Lower Back - Pain   HPI:  Maria Barker is a 63 y.o. female who comes in today at the request of Duwaine Pouch, FNP for planned Left L4-5 Lumbar Interlaminar epidural steroid injection with fluoroscopic guidance.  The patient has failed conservative care including home exercise, medications, time and activity modification.  This injection will be diagnostic and hopefully therapeutic.  Please see requesting physician notes for further details and justification.   ROS Otherwise per HPI.  Assessment & Plan: Visit Diagnoses:    ICD-10-CM   1. Spondylosis without myelopathy or radiculopathy, lumbar region  M47.816 XR C-ARM NO REPORT    Epidural Steroid injection    methylPREDNISolone  acetate (DEPO-MEDROL ) injection 40 mg      Plan: No additional findings.   Meds & Orders:  Meds ordered this encounter  Medications   methylPREDNISolone  acetate (DEPO-MEDROL ) injection 40 mg    Orders Placed This Encounter  Procedures   XR C-ARM NO REPORT   Epidural Steroid injection    Follow-up: Return for visit to requesting provider as needed.   Procedures: No procedures performed  Lumbar Epidural Steroid Injection - Interlaminar Approach with Fluoroscopic Guidance  Patient: Maria Barker      Date of Birth: 10/05/1961 MRN: [id number] PCP: Geofm Glade PARAS, MD      Visit Date: 10/08/2024   Universal Protocol:     Consent Given By: the patient  Position: PRONE  Additional Comments: Vital signs were monitored before and after the procedure. Patient was prepped and draped in the usual sterile fashion. The correct patient, procedure, and site was verified.   Injection Procedure Details:   Procedure diagnoses: Spondylosis without myelopathy or radiculopathy,  lumbar region [M47.816]   Meds Administered:  Meds ordered this encounter  Medications   methylPREDNISolone  acetate (DEPO-MEDROL ) injection 40 mg     Laterality: Left  Location/Site:  L4-5  Needle: 3.5 in., 20 ga. Tuohy  Needle Placement: Paramedian epidural  Findings:   -Comments: Excellent flow of contrast into the epidural space.  Procedure Details: Using a paramedian approach from the side mentioned above, the region overlying the inferior lamina was localized under fluoroscopic visualization and the soft tissues overlying this structure were infiltrated with 4 ml. of 1% Lidocaine  without Epinephrine . The Tuohy needle was inserted into the epidural space using a paramedian approach.   The epidural space was localized using loss of resistance along with counter oblique bi-planar fluoroscopic views.  After negative aspirate for air, blood, and CSF, a 2 ml. volume of Isovue-250 was injected into the epidural space and the flow of contrast was observed. Radiographs were obtained for documentation purposes.    The injectate was administered into the level noted above.   Additional Comments:  The patient tolerated the procedure well Dressing: 2 x 2 sterile gauze and Band-Aid    Post-procedure details: Patient was observed during the procedure. Post-procedure instructions were reviewed.  Patient left the clinic in stable condition.   Clinical History: CLINICAL DATA: Chronic lumbar region back pain radiating to both legs  EXAM: MRI LUMBAR SPINE WITHOUT CONTRAST  TECHNIQUE: Multiplanar, multisequence MR imaging of the lumbar spine was performed. No intravenous contrast was administered.  COMPARISON: Radiography 05/19/2022  FINDINGS: Segmentation: 5 lumbar type vertebral bodies.  Alignment: Degenerative anterolisthesis  of 3 mm at L3-4 and 2 mm at L4-5.  Vertebrae: No fracture or focal bone lesion. Facet edematous changes in the lower lumbar spine as below.  Conus  medullaris and cauda equina: Conus extends to the L1 level. Conus and cauda equina appear normal.  Paraspinal and other soft tissues: Negative  Disc levels:  No significant finding from T11-12 through L2-3.  L3-4: Advanced bilateral facet arthropathy with gaping, fluid-filled joints, worse on the right than the left. Anterolisthesis of 3 mm that would likely worsen with standing or flexion. Bulging of the disc. Stenosis of the canal and lateral recesses left more than right. This appearance would probably worsen with standing or flexion.  L4-5: Bilateral facet degeneration and hypertrophy. Anterolisthesis of 2 mm. Shallow protrusion of the disc. Stenosis of both lateral recesses that could cause neural compression on either or both sides.  L5-S1: Minimal disc bulge. Mild facet osteoarthritis. No stenosis.  IMPRESSION: 1. L3-4: Advanced bilateral facet arthropathy with gaping, fluid-filled joints, worse on the right than the left. Anterolisthesis of 3 mm that would likely worsen with standing or flexion. Bulging of the disc. Stenosis of the canal and lateral recesses left more than right, likely to cause neural compression. This appearance would probably worsen with standing or flexion. 2. L4-5: Bilateral facet degeneration and hypertrophy. Anterolisthesis of 2 mm. Shallow protrusion of the disc. Stenosis of both lateral recesses that could cause neural compression on either or both sides. 3. L5-S1: Minimal disc bulge. Mild facet osteoarthritis. No stenosis.   Electronically Signed By: Oneil Officer M.D. On: 03/02/2024 12:02     Objective:  VS:  HT:    WT:   BMI:     BP:(!) 163/85  HR:71bpm  TEMP: ( )  RESP:  Physical Exam Vitals and nursing note reviewed.  Constitutional:      General: She is not in acute distress.    Appearance: Normal appearance. She is not ill-appearing.  HENT:     Head: Normocephalic and atraumatic.     Right Ear: External ear normal.      Left Ear: External ear normal.  Eyes:     Extraocular Movements: Extraocular movements intact.  Cardiovascular:     Rate and Rhythm: Normal rate.     Pulses: Normal pulses.  Pulmonary:     Effort: Pulmonary effort is normal. No respiratory distress.  Abdominal:     General: There is no distension.     Palpations: Abdomen is soft.  Musculoskeletal:        General: Tenderness present.     Cervical back: Neck supple.     Right lower leg: No edema.     Left lower leg: No edema.     Comments: Patient has good distal strength with no pain over the greater trochanters.  No clonus or focal weakness.  Skin:    Findings: No erythema, lesion or rash.  Neurological:     General: No focal deficit present.     Mental Status: She is alert and oriented to person, place, and time.     Sensory: No sensory deficit.     Motor: No weakness or abnormal muscle tone.     Coordination: Coordination normal.  Psychiatric:        Mood and Affect: Mood normal.        Behavior: Behavior normal.      Imaging: No results found.

## 2024-10-10 ENCOUNTER — Other Ambulatory Visit: Payer: Self-pay | Admitting: Internal Medicine

## 2024-10-29 ENCOUNTER — Encounter: Payer: Self-pay | Admitting: Radiology

## 2024-11-09 ENCOUNTER — Telehealth: Payer: Self-pay | Admitting: Physical Medicine and Rehabilitation

## 2024-11-09 NOTE — Telephone Encounter (Signed)
 Patient called and wants to make an appointment for back injection. CB#[phone]

## 2024-11-11 ENCOUNTER — Encounter: Payer: Self-pay | Admitting: Internal Medicine

## 2024-11-11 NOTE — Patient Instructions (Addendum)
      Blood work was ordered.       Medications changes include :   None    A referral was ordered and someone will call you to schedule an appointment.     Return in about 6 months (around 05/14/2025) for Physical Exam.

## 2024-11-11 NOTE — Progress Notes (Unsigned)
 Subjective:    Patient ID: Maria Barker, female    DOB: 04-23-1961, 63 y.o.   MRN: [id number]     HPI Maria Barker is here for follow up of her chronic medical problems.  She is still having right arm pain and back/leg pain.  She had an injection by ortho - it only lasted 4 days.    Medications and allergies reviewed with patient and updated if appropriate.  Current Outpatient Medications on File Prior to Visit  Medication Sig Dispense Refill   Ascorbic Acid (C 1000 PO) Take by mouth.     Calcium  Carbonate-Vitamin D  (CALCIUM -VITAMIN D3 PO) Take 1 each by mouth 2 (two) times daily.     celecoxib  (CELEBREX ) 200 MG capsule Take 1 capsule (200 mg total) by mouth daily. 60 capsule 0   Cholecalciferol  (D3 ADULT PO) Take by mouth.     Cyanocobalamin  (B-12 PO) Take by mouth.     diclofenac  (VOLTAREN ) 75 MG EC tablet Take 1 tablet (75 mg total) by mouth 2 (two) times daily. 30 tablet 2   estradiol (ESTRACE) 0.1 MG/GM vaginal cream Place vaginally.     levothyroxine  (SYNTHROID ) 50 MCG tablet TAKE 1 TABLET BY MOUTH ONCE DAILY 6 DAYS A WEEK 30 tablet 0   melatonin 5 MG TABS Take 5 mg by mouth daily as needed.     Omega-3 1000 MG CAPS Take 2,000 mg by mouth.     omeprazole  (PRILOSEC) 20 MG capsule Take 1 capsule (20 mg total) by mouth daily as needed. 90 capsule 3   tiZANidine  (ZANAFLEX ) 4 MG tablet Take 1 tablet (4 mg total) by mouth every 6 (six) hours as needed for muscle spasms. 30 tablet 2   triamcinolone  cream (KENALOG ) 0.1 % Apply 1 Application topically 2 (two) times daily. Use for up to 14 days 30 g 0   No current facility-administered medications on file prior to visit.     Review of Systems  Constitutional:  Negative for fever.  Respiratory:  Negative for cough, shortness of breath and wheezing.   Cardiovascular:  Negative for chest pain, palpitations and leg swelling.  Gastrointestinal:  Negative for abdominal pain.       GERD controlled  Musculoskeletal:  Positive for back  pain.  Neurological:  Negative for light-headedness and headaches.  Psychiatric/Behavioral:  Negative for dysphoric mood.        Objective:   Vitals:   11/14/24 1026  BP: 124/76  Pulse: 78  Temp: 98.3 F (36.8 C)  SpO2: 96%   BP Readings from Last 3 Encounters:  11/14/24 124/76  10/08/24 (!) 163/85  05/14/24 112/66   Wt Readings from Last 3 Encounters:  11/14/24 132 lb (59.9 kg)  05/14/24 134 lb (60.8 kg)  03/13/24 134 lb (60.8 kg)   Body mass index is 26.66 kg/m.    Physical Exam Constitutional:      General: She is not in acute distress.    Appearance: Normal appearance.  HENT:     Head: Normocephalic and atraumatic.  Eyes:     Conjunctiva/sclera: Conjunctivae normal.  Cardiovascular:     Rate and Rhythm: Normal rate and regular rhythm.     Heart sounds: Normal heart sounds.  Pulmonary:     Effort: Pulmonary effort is normal. No respiratory distress.     Breath sounds: Normal breath sounds. No wheezing.  Musculoskeletal:     Cervical back: Neck supple.     Right lower leg: No edema.  Left lower leg: No edema.  Lymphadenopathy:     Cervical: No cervical adenopathy.  Skin:    General: Skin is warm and dry.     Findings: No rash.  Neurological:     Mental Status: She is alert. Mental status is at baseline.  Psychiatric:        Mood and Affect: Mood normal.        Behavior: Behavior normal.        Lab Results  Component Value Date   WBC 5.4 05/14/2024   HGB 11.9 (L) 05/14/2024   HCT 36.5 05/14/2024   PLT 329.0 05/14/2024   GLUCOSE 95 05/14/2024   CHOL 143 05/14/2024   TRIG 57.0 05/14/2024   HDL 58.90 05/14/2024   LDLDIRECT 93.7 12/21/2013   LDLCALC 72 05/14/2024   ALT 22 05/14/2024   AST 26 05/14/2024   NA 131 (L) 05/14/2024   K 3.9 05/14/2024   CL 101 05/14/2024   CREATININE 0.52 05/14/2024   BUN 17 05/14/2024   CO2 25 05/14/2024   TSH 1.56 05/14/2024   INR 1.18 02/27/2016   HGBA1C 6.2 05/14/2024     Assessment & Plan:     See Problem List for Assessment and Plan of chronic medical problems.

## 2024-11-14 ENCOUNTER — Ambulatory Visit: Admitting: Internal Medicine

## 2024-11-14 ENCOUNTER — Other Ambulatory Visit: Payer: Self-pay

## 2024-11-14 VITALS — BP 124/76 | HR 78 | Temp 98.3°F | Ht 59.0 in | Wt 132.0 lb

## 2024-11-14 DIAGNOSIS — Z23 Encounter for immunization: Secondary | ICD-10-CM | POA: Diagnosis not present

## 2024-11-14 DIAGNOSIS — E538 Deficiency of other specified B group vitamins: Secondary | ICD-10-CM

## 2024-11-14 DIAGNOSIS — M8588 Other specified disorders of bone density and structure, other site: Secondary | ICD-10-CM | POA: Diagnosis not present

## 2024-11-14 DIAGNOSIS — E038 Other specified hypothyroidism: Secondary | ICD-10-CM

## 2024-11-14 DIAGNOSIS — I10 Essential (primary) hypertension: Secondary | ICD-10-CM

## 2024-11-14 DIAGNOSIS — K219 Gastro-esophageal reflux disease without esophagitis: Secondary | ICD-10-CM

## 2024-11-14 DIAGNOSIS — E782 Mixed hyperlipidemia: Secondary | ICD-10-CM

## 2024-11-14 DIAGNOSIS — E7800 Pure hypercholesterolemia, unspecified: Secondary | ICD-10-CM

## 2024-11-14 DIAGNOSIS — R7303 Prediabetes: Secondary | ICD-10-CM

## 2024-11-14 LAB — LIPID PANEL
Cholesterol: 144 mg/dL (ref 0–200)
HDL: 60.9 mg/dL (ref >39.00)
LDL Cholesterol: 69 mg/dL (ref 0–99)
NonHDL: 83.31
Total CHOL/HDL Ratio: 2
Triglycerides: 73 mg/dL (ref 0.0–149.0)
VLDL: 14.6 mg/dL (ref 0.0–40.0)

## 2024-11-14 LAB — HEMOGLOBIN A1C: Hgb A1c MFr Bld: 6.1 % (ref 4.6–6.5)

## 2024-11-14 LAB — CBC WITH DIFFERENTIAL/PLATELET
Basophils Absolute: 0.1 K/uL (ref 0.0–0.1)
Basophils Relative: 1.2 % (ref 0.0–3.0)
Eosinophils Absolute: 1.2 K/uL — ABNORMAL HIGH (ref 0.0–0.7)
Eosinophils Relative: 22.4 % — ABNORMAL HIGH (ref 0.0–5.0)
HCT: 36.2 % (ref 36.0–46.0)
Hemoglobin: 11.8 g/dL — ABNORMAL LOW (ref 12.0–15.0)
Lymphocytes Relative: 25.8 % (ref 12.0–46.0)
Lymphs Abs: 1.4 K/uL (ref 0.7–4.0)
MCHC: 32.7 g/dL (ref 30.0–36.0)
MCV: 85.9 fl (ref 78.0–100.0)
Monocytes Absolute: 0.5 K/uL (ref 0.1–1.0)
Monocytes Relative: 8.7 % (ref 3.0–12.0)
Neutro Abs: 2.3 K/uL (ref 1.4–7.7)
Neutrophils Relative %: 41.9 % — ABNORMAL LOW (ref 43.0–77.0)
Platelets: 337 K/uL (ref 150.0–400.0)
RBC: 4.21 Mil/uL (ref 3.87–5.11)
RDW: 14 % (ref 11.5–15.5)
WBC: 5.5 K/uL (ref 4.0–10.5)

## 2024-11-14 LAB — VITAMIN D 25 HYDROXY (VIT D DEFICIENCY, FRACTURES): VITD: 26.97 ng/mL — ABNORMAL LOW (ref 30.00–100.00)

## 2024-11-14 LAB — COMPREHENSIVE METABOLIC PANEL WITH GFR
ALT: 17 U/L (ref 0–35)
AST: 24 U/L (ref 0–37)
Albumin: 4.1 g/dL (ref 3.5–5.2)
Alkaline Phosphatase: 54 U/L (ref 39–117)
BUN: 16 mg/dL (ref 6–23)
CO2: 26 meq/L (ref 19–32)
Calcium: 9 mg/dL (ref 8.4–10.5)
Chloride: 100 meq/L (ref 96–112)
Creatinine, Ser: 0.57 mg/dL (ref 0.40–1.20)
GFR: 98.08 mL/min (ref >60.00)
Glucose, Bld: 88 mg/dL (ref 70–99)
Potassium: 3.7 meq/L (ref 3.5–5.1)
Sodium: 133 meq/L — ABNORMAL LOW (ref 135–145)
Total Bilirubin: 0.5 mg/dL (ref 0.2–1.2)
Total Protein: 8.5 g/dL — ABNORMAL HIGH (ref 6.0–8.3)

## 2024-11-14 LAB — TSH: TSH: 1.63 u[IU]/mL (ref 0.35–5.50)

## 2024-11-14 LAB — VITAMIN B12: Vitamin B-12: 942 pg/mL — ABNORMAL HIGH (ref 211–911)

## 2024-11-14 MED ORDER — OLMESARTAN MEDOXOMIL 5 MG PO TABS: 5.0000 mg | ORAL_TABLET | Freq: Every day | ORAL | 1 refills | Status: AC

## 2024-11-14 MED ORDER — ATORVASTATIN CALCIUM 10 MG PO TABS: 10.0000 mg | ORAL_TABLET | Freq: Every day | ORAL | 4 refills | Status: AC

## 2024-11-14 NOTE — Assessment & Plan Note (Signed)
 Chronic  Clinically euthyroid Check tsh and will titrate med dose if needed Continue levothyroxine  50 mcg 6 days a week

## 2024-11-14 NOTE — Assessment & Plan Note (Signed)
Chronic GERD controlled Continue omeprazole 20 mg daily as needed

## 2024-11-14 NOTE — Assessment & Plan Note (Signed)
 Chronic Regular exercise and healthy diet encouraged Check lipid panel, CMP Continue atorvastatin 10 mg daily

## 2024-11-14 NOTE — Assessment & Plan Note (Signed)
 Chronic Lab Results  Component Value Date   HGBA1C 6.2 05/14/2024   Check a1c Low sugar / carb diet Stressed regular exercise

## 2024-11-14 NOTE — Assessment & Plan Note (Signed)
 Chronic Blood pressure controlled  CMP, CBC Continue Benicar 5 mg daily

## 2024-11-14 NOTE — Assessment & Plan Note (Signed)
 Chronic Continue vitamin B12 daily Check B12 level

## 2024-11-14 NOTE — Assessment & Plan Note (Signed)
Chronic DEXA up-to-date Managed by GYN-Dr. Lowe Stressed regular exercise Continue calcium and vitamin D Check vitamin D level 

## 2024-11-15 ENCOUNTER — Ambulatory Visit: Payer: Self-pay | Admitting: Internal Medicine

## 2024-11-16 ENCOUNTER — Other Ambulatory Visit: Payer: Self-pay | Admitting: Internal Medicine

## 2024-11-21 ENCOUNTER — Other Ambulatory Visit: Payer: Self-pay | Admitting: Physical Medicine and Rehabilitation

## 2024-11-30 ENCOUNTER — Encounter: Payer: Self-pay | Admitting: Physical Medicine and Rehabilitation

## 2024-11-30 ENCOUNTER — Ambulatory Visit: Admitting: Physical Medicine and Rehabilitation

## 2024-11-30 ENCOUNTER — Other Ambulatory Visit (INDEPENDENT_AMBULATORY_CARE_PROVIDER_SITE_OTHER): Payer: Self-pay

## 2024-11-30 DIAGNOSIS — M5442 Lumbago with sciatica, left side: Secondary | ICD-10-CM | POA: Diagnosis not present

## 2024-11-30 DIAGNOSIS — G8929 Other chronic pain: Secondary | ICD-10-CM

## 2024-11-30 DIAGNOSIS — M5441 Lumbago with sciatica, right side: Secondary | ICD-10-CM | POA: Diagnosis not present

## 2024-11-30 DIAGNOSIS — M4316 Spondylolisthesis, lumbar region: Secondary | ICD-10-CM

## 2024-11-30 DIAGNOSIS — M5416 Radiculopathy, lumbar region: Secondary | ICD-10-CM

## 2024-11-30 NOTE — Progress Notes (Unsigned)
 Pain Scale   Average Pain 10 Patient advising she had about 80% relief after her injection in October, relief for aprox. 4 days then pain has gotten worse and increasing to bilateral legs and causing cramping to calf's         +Driver, -BT, -Dye Allergies.

## 2024-11-30 NOTE — Progress Notes (Unsigned)
 Maria Barker - 63 y.o. female MRN [id number]  Date of birth: August 04, 1961  Office Visit Note: Visit Date: 11/30/2024 PCP: Geofm Glade PARAS, MD Referred by: Geofm Glade PARAS, MD  Subjective: Chief Complaint  Patient presents with   Lower Back - Follow-up   HPI: Maria Barker is a 63 y.o. female who comes in today for evaluation of chronic, worsening and severe bilateral lower back pain radiating to buttocks, lateral legs down to posterolateral calves. Also reports intermittent numbness/tingling to bilateral lower extremities. Her daughter is present and is assisting with interpreting. She underwent left L4-L5 interlaminar epidural steroid injection. She reports 100% relief of pain for 1 week, also reports increased functional ability. States she felt great for that 1 week. Her pain has slowly returned, worsens with standing, walking and activity. She describes pain as sharp sensation, currently rates as 8 out of 10. History of formal physical therapy with minimal relief of pain. Lumbar MRI imaging from March of 2025 (canopy) shows degenerative anterolisthesis of 3 mm at L3-L4 and 2 mm at L4-L5. There is advanced facet arthropathy at L3-L4 with gaping fluid filled joints, there is stenosis of the canal and lateral recesses left more than right, likely to cause neural compression. Bilateral facet hypertrophy and lateral recess stenosis at L4-L5. She was previously treated by Dr. Scot Flatten with [facility]. States he underwent lumbar injection in his office in March of this year with no relief of pain. She also consulted with Dr. Tess as well, per patient he did recommend surgical intervention. She is using cane to assist with ambulation.   Of note, she has history of bilateral hip replacements.      Review of Systems  Musculoskeletal:  Positive for back pain.  Neurological:  Positive for tingling. Negative for sensory change, focal weakness and weakness.  All other systems reviewed and are  negative.  Otherwise per HPI.  Assessment & Plan: Visit Diagnoses:    ICD-10-CM   1. Chronic bilateral low back pain with bilateral sciatica  M54.42 Ambulatory referral to Physical Medicine Rehab   M54.41 Ambulatory referral to Orthopedic Surgery   G89.29 XR Lumbar Spine 2-3 Views    2. Radiculopathy, lumbar region  M54.16 Ambulatory referral to Physical Medicine Rehab    Ambulatory referral to Orthopedic Surgery    XR Lumbar Spine 2-3 Views    3. Spondylolisthesis, lumbar region  M43.16 Ambulatory referral to Physical Medicine Rehab    Ambulatory referral to Orthopedic Surgery    XR Lumbar Spine 2-3 Views       Plan: Findings:  Chronic, worsening and severe bilateral lower back pain radiating to buttocks, lateral legs down to posterolateral calves. Significant relief of pain with recent left L4-L5 interlaminar epidural steroid injection for 1 week. Patient continues to have severe pain despite good conservative therapies such as formal physical therapy, home exercise regimen, rest and use of medications. Patients clinical presentation and exam are consistent with lumbar radiculopathy, more of L5 and S1 nerve pattern. Prior lumbar MRI imaging shows multi level degenerative spondylolisthesis, canal and lateral recess stenosis. There is advanced bilateral facet arthropathy with gaping fluid-filled joints at L3-L4. I obtained lumbar flexion/extension films in the office today that does not show evidence of dynamic spondylolisthesis. We discussed treatment plan in detail today. Next step is to perform left L4-L5 interlaminar epidural steroid injection under fluoroscopic guidance. She is not currently taking anticoagulant medication. She would like second opinion regarding surgery, I placed referral to our spine surgeon Dr.  Ozell Ada. She is requesting intermittent FMLA, I am happy to complete for her. No red flag symptoms noted upon exam today.     Meds & Orders: No orders of the defined types  were placed in this encounter.   Orders Placed This Encounter  Procedures   XR Lumbar Spine 2-3 Views   Ambulatory referral to Physical Medicine Rehab   Ambulatory referral to Orthopedic Surgery    Follow-up: Return for Left L4-L5 interlaminar epidural steroid injection.   Procedures: No procedures performed      Clinical History: CLINICAL DATA: Chronic lumbar region back pain radiating to both legs  EXAM: MRI LUMBAR SPINE WITHOUT CONTRAST  TECHNIQUE: Multiplanar, multisequence MR imaging of the lumbar spine was performed. No intravenous contrast was administered.  COMPARISON: Radiography 05/19/2022  FINDINGS: Segmentation: 5 lumbar type vertebral bodies.  Alignment: Degenerative anterolisthesis of 3 mm at L3-4 and 2 mm at L4-5.  Vertebrae: No fracture or focal bone lesion. Facet edematous changes in the lower lumbar spine as below.  Conus medullaris and cauda equina: Conus extends to the L1 level. Conus and cauda equina appear normal.  Paraspinal and other soft tissues: Negative  Disc levels:  No significant finding from T11-12 through L2-3.  L3-4: Advanced bilateral facet arthropathy with gaping, fluid-filled joints, worse on the right than the left. Anterolisthesis of 3 mm that would likely worsen with standing or flexion. Bulging of the disc. Stenosis of the canal and lateral recesses left more than right. This appearance would probably worsen with standing or flexion.  L4-5: Bilateral facet degeneration and hypertrophy. Anterolisthesis of 2 mm. Shallow protrusion of the disc. Stenosis of both lateral recesses that could cause neural compression on either or both sides.  L5-S1: Minimal disc bulge. Mild facet osteoarthritis. No stenosis.  IMPRESSION: 1. L3-4: Advanced bilateral facet arthropathy with gaping, fluid-filled joints, worse on the right than the left. Anterolisthesis of 3 mm that would likely worsen with standing or flexion. Bulging of the  disc. Stenosis of the canal and lateral recesses left more than right, likely to cause neural compression. This appearance would probably worsen with standing or flexion. 2. L4-5: Bilateral facet degeneration and hypertrophy. Anterolisthesis of 2 mm. Shallow protrusion of the disc. Stenosis of both lateral recesses that could cause neural compression on either or both sides. 3. L5-S1: Minimal disc bulge. Mild facet osteoarthritis. No stenosis.   Electronically Signed By: Oneil Officer M.D. On: 03/02/2024 12:02   She reports that she has never smoked. She has never been exposed to tobacco smoke. She has never used smokeless tobacco.  Recent Labs    05/14/24 1104 11/14/24 1115  HGBA1C 6.2 6.1    Objective:  VS:  HT:    WT:   BMI:     BP:   HR: bpm  TEMP: ( )  RESP:  Physical Exam Vitals and nursing note reviewed.  HENT:     Head: Normocephalic and atraumatic.     Right Ear: External ear normal.     Left Ear: External ear normal.     Nose: Nose normal.     Mouth/Throat:     Mouth: Mucous membranes are moist.  Eyes:     Extraocular Movements: Extraocular movements intact.  Cardiovascular:     Rate and Rhythm: Normal rate.     Pulses: Normal pulses.  Pulmonary:     Effort: Pulmonary effort is normal.  Abdominal:     General: Abdomen is flat. There is no distension.  Musculoskeletal:  General: Tenderness present.     Cervical back: Normal range of motion.     Comments: Patient rises from seated position to standing without difficulty. Good lumbar range of motion. No pain noted with facet loading. 5/5 strength noted with bilateral hip flexion, knee flexion/extension, ankle dorsiflexion/plantarflexion and EHL. No clonus noted bilaterally. No pain upon palpation of greater trochanters. No pain with internal/external rotation of bilateral hips. Sensation intact bilaterally. Dysesthesias noted to bilateral L5 and S1 dermatomes. Negative slump test bilaterally. Ambulates  without aid, gait steady.   Skin:    General: Skin is warm and dry.     Capillary Refill: Capillary refill takes less than 2 seconds.  Neurological:     Mental Status: She is alert and oriented to person, place, and time.     Gait: Gait abnormal.  Psychiatric:        Mood and Affect: Mood normal.        Behavior: Behavior normal.     Ortho Exam  Imaging: XR Lumbar Spine 2-3 Views Result Date: 11/30/2024 No evidence of dynamic spondylolisthesis.    Past Medical/Family/Surgical/Social History: Medications & Allergies reviewed per EMR, new medications updated. 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   Pain of both elbows 05/11/2021   Osteopenia - Dr Marget 11/27/2020   Rash on lips 01/08/2019   Essential hypertension 01/08/2019   De Quervain's tenosynovitis, right 03/09/2018   Prediabetes 09/17/2017   Osteoarthritis of left knee 04/12/2017   B12 deficiency 08/13/2016   S/P bilateral hip replacements 08/13/2016   Family history of brain aneurysm 08/12/2015   Gastroesophageal reflux disease without esophagitis 08/12/2015   Hypothyroid 02/06/2015   Hypercholesteremia    Chronic insomnia 04/13/2013   Past Medical History:  Diagnosis Date   Arthritis    R THR 2011   Back pain    Frequent headaches    Hyperlipidemia LDL goal < 130    Hypertension    Hypothyroid 02/06/2015   Dx 01/2015: TSH 5.4   Insomnia    Left hip pain    Pain of left thigh    Family History  Problem Relation Age of Onset   CVA Mother    Hypertension Mother    CVA Sister    Colon cancer Neg Hx     Colon polyps Neg Hx    Esophageal cancer Neg Hx    Stomach cancer Neg Hx    Rectal cancer Neg Hx    Past Surgical History:  Procedure Laterality Date   TOTAL HIP ARTHROPLASTY Right 03/31/10   olin   TOTAL HIP ARTHROPLASTY Left 03/08/2016   Procedure: TOTAL HIP ARTHROPLASTY;  Surgeon: Dempsey Sensor, MD;  Location: [facility] OR;  Service: Orthopedics;  Laterality: Left;   TUBAL LIGATION     UPPER GASTROINTESTINAL ENDOSCOPY     Social History   Occupational History   Occupation: location manager    Employer: [employer]  Tobacco Use   Smoking status: Never    Passive exposure: Never   Smokeless tobacco: Never  Vaping Use   Vaping status: Never Used  Substance and Sexual  Activity   Alcohol use: No   Drug use: No   Sexual activity: Not on file

## 2024-12-14 ENCOUNTER — Ambulatory Visit: Admitting: Physical Medicine and Rehabilitation

## 2024-12-14 DIAGNOSIS — M5416 Radiculopathy, lumbar region: Secondary | ICD-10-CM

## 2024-12-14 NOTE — Progress Notes (Signed)
 No charge for visit today. We will see her back for repeat lumbar epidural steroid injection in January. She also has upcoming appointment with Dr. Georgina on 1/22.

## 2025-01-17 ENCOUNTER — Ambulatory Visit: Admitting: Orthopedic Surgery

## 2025-01-17 ENCOUNTER — Other Ambulatory Visit: Payer: Self-pay

## 2025-01-17 VITALS — BP 183/81 | HR 65 | Ht 59.0 in | Wt 132.0 lb

## 2025-01-17 DIAGNOSIS — M545 Low back pain, unspecified: Secondary | ICD-10-CM

## 2025-01-17 MED ORDER — PREGABALIN 75 MG PO CAPS: 75.0000 mg | ORAL_CAPSULE | Freq: Two times a day (BID) | ORAL | 1 refills | Status: AC

## 2025-01-17 NOTE — Progress Notes (Signed)
 Orthopedic Spine Surgery Office Note  Assessment: Patient is a 64 y.o. female with low back pain that radiates into her bilateral lateral thighs and legs.  Has spondylolisthesis at L3/4 and L4/5.  Symptoms seem more consistent with L4/5. Responded to an L4/5 injection   Plan: -Explained that initially conservative treatment is tried as a significant number of patients may experience relief with these treatment modalities. Discussed that the conservative treatments include:  -activity modification  -physical therapy  -over the counter pain medications  -medrol  dosepak  -lumbar steroid injections -Patient has tried PT, Tylenol , celebrex , tizanidine , oral steroids, lumbar steroid injection -Talked about surgery as an option but patient was not interested.  She has not tried any gabapentin  annoyed medications, so discussed this is an option.  She wanted to try Lyrica .  This prescription was sent in for her today -Patient should return to office in 8 weeks, x-rays at next visit: none   Patient expressed understanding of the plan and all questions were answered to the patient's satisfaction.   ___________________________________________________________________________   History:  Patient is a 64 y.o. female who presents today for lumbar spine.  Patient has had over a year of low back pain that radiates into her bilateral lower extremities.  She feels the going into the lateral aspect of her thighs and legs to the level of the ankle.  She notes it is worse with activity and improves with rest.  She notes it particularly when getting up out of a chair.  There was no trauma or injury that preceded the onset of the pain.  She did get an injection that gave her great relief but it was temporary.  Pain gradually returned after that injection.  Her injection was at L4/5.  She has not noticed any significant relief with medications that she has tried.   Weakness: Denies Symptoms of imbalance:  Denies Paresthesias and numbness: Denies Bowel or bladder incontinence: Denies Saddle anesthesia: Denies  Treatments tried: PT, Tylenol , celebrex , tizanidine , oral steroids, lumbar steroid injection  Review of systems: Denies fevers and chills, night sweats, unexplained weight loss, history of cancer.  Has had pain that wakes her at night  Past medical history: HLD HTN Hypothyroidism Insomnia  Allergies: ASA, ibuprofen  Past surgical history:  Bilateral THA Tubal ligation  Social history: Denies use of nicotine product (smoking, vaping, patches, smokeless) Alcohol use: Denies Denies recreational drug use   Physical Exam:  BMI of 26.7  General: no acute distress, appears stated age Neurologic: alert, answering questions appropriately, following commands Respiratory: unlabored breathing on room air, symmetric chest rise Psychiatric: appropriate affect, normal cadence to speech   MSK (spine):  -Strength exam      Left  Right EHL    5/5  5/5 TA    5/5  5/5 GSC    5/5  5/5 Knee extension  5/5  5/5 Hip flexion   5/5  5/5  -Sensory exam    Sensation intact to light touch in L3-S1 nerve distributions of bilateral lower extremities  -Achilles DTR: 1/4 on the left, 1/4 on the right -Patellar tendon DTR: 1/4 on the left, 1/4 on the right  -Straight leg raise: Negative bilaterally -Clonus: no beats bilaterally  -Left hip exam: No pain through range of motion, negative Stinchfield, negative FABER -Right hip exam: No pain through range of motion, negative Stinchfield, negative FABER  Imaging: XRs of the lumbar spine from 01/17/2025 were independently reviewed and interpreted, showing grade 1 spondylolisthesis at L3/4 and L4/5.  Disc height loss at L3/4.  No fracture or dislocation seen.  MRI of the lumbar spine from 02/24/2024 was independently reviewed and interpreted, showing spondylolisthesis at L3/4.  L4/5 spondylolisthesis that was seen on standing films appears  reduced.  Mild central stenosis at L3/4.  Mild lateral recess stenosis at L4/5.  Increased T2 signal seen within the right facet joint at L3/4.   Patient name: Maria Barker Patient MRN: [id number] Date of visit: 01/17/25

## 2025-01-24 ENCOUNTER — Other Ambulatory Visit: Payer: Self-pay | Admitting: Internal Medicine

## 2025-03-14 ENCOUNTER — Ambulatory Visit: Admitting: Orthopedic Surgery

## 2025-05-16 ENCOUNTER — Encounter: Admitting: Internal Medicine
